# Patient Record
Sex: Male | Born: 1950
Health system: Southern US, Community
[De-identification: ages and names within clinical notes are randomized; demographics above are authoritative.]

## PROBLEM LIST (undated history)

## (undated) DIAGNOSIS — I10 Essential (primary) hypertension: Secondary | ICD-10-CM

## (undated) DIAGNOSIS — C61 Malignant neoplasm of prostate: Secondary | ICD-10-CM

## (undated) DIAGNOSIS — C251 Malignant neoplasm of body of pancreas: Secondary | ICD-10-CM

## (undated) DIAGNOSIS — H269 Unspecified cataract: Secondary | ICD-10-CM

## (undated) DIAGNOSIS — K219 Gastro-esophageal reflux disease without esophagitis: Secondary | ICD-10-CM

## (undated) DIAGNOSIS — K5790 Diverticulosis of intestine, part unspecified, without perforation or abscess without bleeding: Secondary | ICD-10-CM

## (undated) DIAGNOSIS — K4090 Unilateral inguinal hernia, without obstruction or gangrene, not specified as recurrent: Secondary | ICD-10-CM

## (undated) HISTORY — PX: EYE SURGERY: SHX253

## (undated) HISTORY — DX: Essential (primary) hypertension: I10

## (undated) HISTORY — DX: Malignant neoplasm of prostate: C61

## (undated) HISTORY — DX: Unspecified cataract: H26.9

## (undated) HISTORY — DX: Gastro-esophageal reflux disease without esophagitis: K21.9

---

## 1999-01-01 ENCOUNTER — Emergency Department (HOSPITAL_COMMUNITY): Admission: EM | Admit: 1999-01-01 | Discharge: 1999-01-01 | Payer: Self-pay | Admitting: Emergency Medicine

## 1999-01-01 ENCOUNTER — Encounter: Payer: Self-pay | Admitting: Family Medicine

## 1999-08-19 ENCOUNTER — Emergency Department (HOSPITAL_COMMUNITY): Admission: EM | Admit: 1999-08-19 | Discharge: 1999-08-19 | Payer: Self-pay | Admitting: Emergency Medicine

## 1999-08-19 ENCOUNTER — Encounter: Payer: Self-pay | Admitting: Emergency Medicine

## 1999-08-20 ENCOUNTER — Encounter: Admission: RE | Admit: 1999-08-20 | Discharge: 1999-08-20 | Payer: Self-pay | Admitting: Internal Medicine

## 2003-04-19 ENCOUNTER — Emergency Department (HOSPITAL_COMMUNITY): Admission: EM | Admit: 2003-04-19 | Discharge: 2003-04-19 | Payer: Self-pay | Admitting: Emergency Medicine

## 2004-07-11 DIAGNOSIS — C61 Malignant neoplasm of prostate: Secondary | ICD-10-CM

## 2004-07-11 HISTORY — DX: Malignant neoplasm of prostate: C61

## 2004-08-01 HISTORY — PX: PROSTATECTOMY: SHX69

## 2004-08-08 ENCOUNTER — Inpatient Hospital Stay (HOSPITAL_COMMUNITY): Admission: RE | Admit: 2004-08-08 | Discharge: 2004-08-10 | Payer: Self-pay | Admitting: Urology

## 2004-08-08 ENCOUNTER — Encounter (INDEPENDENT_AMBULATORY_CARE_PROVIDER_SITE_OTHER): Payer: Self-pay | Admitting: *Deleted

## 2007-01-19 ENCOUNTER — Ambulatory Visit: Admission: RE | Admit: 2007-01-19 | Discharge: 2007-03-03 | Payer: Self-pay | Admitting: Urology

## 2007-03-04 ENCOUNTER — Ambulatory Visit: Admission: RE | Admit: 2007-03-04 | Discharge: 2007-05-07 | Payer: Self-pay | Admitting: Urology

## 2008-08-09 ENCOUNTER — Emergency Department (HOSPITAL_COMMUNITY): Admission: EM | Admit: 2008-08-09 | Discharge: 2008-08-09 | Payer: Self-pay | Admitting: Emergency Medicine

## 2008-10-01 HISTORY — PX: ROTATOR CUFF REPAIR: SHX139

## 2008-11-07 ENCOUNTER — Ambulatory Visit (HOSPITAL_COMMUNITY): Admission: RE | Admit: 2008-11-07 | Discharge: 2008-11-08 | Payer: Self-pay | Admitting: Orthopedic Surgery

## 2009-03-03 HISTORY — PX: CATARACT EXTRACTION: SUR2

## 2009-03-28 ENCOUNTER — Emergency Department (HOSPITAL_COMMUNITY): Admission: EM | Admit: 2009-03-28 | Discharge: 2009-03-28 | Payer: Self-pay | Admitting: Emergency Medicine

## 2010-05-19 LAB — CBC
MCHC: 35 g/dL (ref 30.0–36.0)
Platelets: 178 10*3/uL (ref 150–400)
RBC: 5.06 MIL/uL (ref 4.22–5.81)
RDW: 13 % (ref 11.5–15.5)
WBC: 4.9 10*3/uL (ref 4.0–10.5)

## 2010-05-19 LAB — BASIC METABOLIC PANEL
BUN: 10 mg/dL (ref 6–23)
Chloride: 95 mEq/L — ABNORMAL LOW (ref 96–112)
GFR calc non Af Amer: >60 mL/min (ref >60)

## 2010-05-19 LAB — POCT CARDIAC MARKERS
CKMB, poc: 3.4 ng/mL (ref 1.0–8.0)
Myoglobin, poc: 99.5 ng/mL (ref 12–200)
Troponin i, poc: <0.05 ng/mL (ref 0.00–0.09)

## 2010-05-19 LAB — DIFFERENTIAL
Basophils Absolute: 0 10*3/uL (ref 0.0–0.1)
Basophils Relative: 1 % (ref 0–1)
Eosinophils Absolute: 0.1 10*3/uL (ref 0.0–0.7)
Eosinophils Relative: 3 % (ref 0–5)
Monocytes Relative: 9 % (ref 3–12)

## 2010-06-07 LAB — COMPREHENSIVE METABOLIC PANEL
ALT: 29 U/L (ref 0–53)
Alkaline Phosphatase: 62 U/L (ref 39–117)
Chloride: 105 mEq/L (ref 96–112)
Creatinine, Ser: 0.75 mg/dL (ref 0.4–1.5)
GFR calc Af Amer: >60 mL/min (ref >60)
Glucose, Bld: 104 mg/dL — ABNORMAL HIGH (ref 70–99)
Sodium: 140 mEq/L (ref 135–145)

## 2010-06-07 LAB — URINALYSIS, ROUTINE W REFLEX MICROSCOPIC
Bilirubin Urine: NEGATIVE
Specific Gravity, Urine: 1.021 (ref 1.005–1.030)

## 2010-06-07 LAB — CBC
HCT: 43.5 % (ref 39.0–52.0)
MCHC: 34.5 g/dL (ref 30.0–36.0)
Platelets: 166 10*3/uL (ref 150–400)
RDW: 13.8 % (ref 11.5–15.5)
WBC: 4.8 10*3/uL (ref 4.0–10.5)

## 2010-06-07 LAB — DIFFERENTIAL
Basophils Absolute: 0 10*3/uL (ref 0.0–0.1)
Basophils Relative: 1 % (ref 0–1)
Eosinophils Absolute: 0.1 10*3/uL (ref 0.0–0.7)
Eosinophils Relative: 2 % (ref 0–5)
Monocytes Absolute: 0.5 10*3/uL (ref 0.1–1.0)
Monocytes Relative: 10 % (ref 3–12)

## 2010-06-07 LAB — PROTIME-INR
INR: 1 (ref 0.00–1.49)
Prothrombin Time: 12.8 seconds (ref 11.6–15.2)

## 2010-06-07 LAB — APTT: aPTT: 25 seconds (ref 24–37)

## 2010-06-10 LAB — URINE CULTURE
Colony Count: NO GROWTH
Culture: NO GROWTH

## 2010-06-10 LAB — URINE MICROSCOPIC-ADD ON

## 2010-06-10 LAB — URINALYSIS, ROUTINE W REFLEX MICROSCOPIC
Ketones, ur: NEGATIVE mg/dL
Leukocytes, UA: NEGATIVE
Protein, ur: >300 mg/dL — AB
Urobilinogen, UA: 1 mg/dL (ref 0.0–1.0)

## 2010-07-19 NOTE — H&P (Signed)
NAMEMarland Kitchen  NAZIAH, WECKERLY NO.:  [id number]   MEDICAL RECORD NO.:  [id number]          PATIENT TYPE:  INP   LOCATION:  0006                         FACILITY:  [facility]   PHYSICIAN:  Excell Seltzer. Annabell Howells, M.D.    DATE OF BIRTH:  04-22-1950   DATE OF ADMISSION:  08/08/2004  DATE OF DISCHARGE:                                HISTORY & PHYSICAL   CHIEF COMPLAINT:  Prostate cancer.   HISTORY OF PRESENT ILLNESS:  Mr. Monds is a 60 year old gentleman who was  sent in consultation by Dr. Valarie Cones for an elevated PSA of 6.8 detected on  routine evaluation. Mr. Hartsell otherwise denied any history of lower  urinary tract symptoms or hematuria. Mr. Reaser did have a maternal  grandfather, however, with a history of prostate cancer. On physical  examination,  the patient's prostate was felt to have significant induration in the left  lobe of the prostate. Subsequent transrectal ultrasound-guided biopsy  demonstrated adenocarcinoma throughout the prostate with the highest  concentration being located within the left lobe and demonstrating a Gleason  score of 3+4=7. A long discussion was subsequently held with Mr. Boughner  concerning the options for therapy including open surgery, radiation  therapy, cryosurgery, to watchful waiting. After discussing these options  with his family and considering the risks and consequences associated with  each modality, he has elected to proceed with open radical retropubic  prostatectomy.   REVIEW OF SYSTEMS:  Negative except as above.   PAST MEDICAL HISTORY:  Significant for gastroesophageal reflux,  hypertension.   PAST SURGICAL HISTORY:  Unremarkable.   ALLERGIES:  No known drug allergies.   SOCIAL HISTORY:  The patient smokes half a pack of cigarettes a day and has  done so for 20 years. He drinks three beers daily.   FAMILY HISTORY:  Prostate cancer, kidney stones.   MEDICATIONS:  1.  Lisinopril/HCTZ 10/12.5 mg daily.  2.  Baby aspirin  81 mg daily.  3.  Prilosec once a day.   PHYSICAL EXAMINATION:  VITAL SIGNS:  The patient is afebrile and  hemodynamically stable.  GENERAL:  Well-developed, well-nourished white male in no acute distress.  HEENT:  Pupils equal, round, and reactive to light. Extraocular movement is  intact.  NECK:  No masses.  HEART:  Regular rate and rhythm.  LUNGS:  Clear to auscultation bilaterally.  ABDOMEN:  Soft, nontender, nondistended. Bowel sounds positive. EXTREMITIES:  No clubbing, cyanosis or edema.  GU:  Normal uncircumcised penis with no plaques or masses. Testicles  descended bilaterally with no masses or tenderness  NEUROLOGIC:  Grossly intact.  SKIN:  No lesions.   ASSESSMENT/PLAN:  A 60 year old white male with adenocarcinoma of the  prostate. We will plan to admit Mr. Susman to [facility] at which time he will undergo radical retropubic prostatectomy with  bilateral pelvic lymph node dissection. Following surgery, he will be  transferred to the Post Anesthesia Care Unit at which time further treatment  decisions will be made based on the patient's condition.       EG/MEDQ  D:  08/08/2004  T:  08/08/2004  Job:  [id number]

## 2010-07-19 NOTE — Op Note (Signed)
NAMEMarland Kitchen  NYAL, SCHACHTER NO.:  [id number]   MEDICAL RECORD NO.:  [id number]          PATIENT TYPE:  INP   LOCATION:  0006                         FACILITY:  [facility]   PHYSICIAN:  Excell Seltzer. Annabell Howells, M.D.    DATE OF BIRTH:  11/18/1950   DATE OF PROCEDURE:  08/08/2004  DATE OF DISCHARGE:                                 OPERATIVE REPORT   PREOPERATIVE DIAGNOSIS:  Adenocarcinoma of the prostate (prostatic specific  antigen equals 6.8, Gleason's 3+4=7, clinical stage T2-C).   POSTOPERATIVE DIAGNOSIS:  Adenocarcinoma of the prostate (prostatic specific  antigen equal 6.8, Gleason's 3+4=7, clinical sage T2-C).   PROCEDURE:  Radical retropubic prostatectomy with bilateral pelvic lymph  node dissection.   ATTENDING SURGEON:  Excell Seltzer. Annabell Howells, M.D.   RESIDENT SURGEON:  Rhae Lerner, M.D.   ANESTHESIA:  General endotracheal anesthesia.   COMPLICATIONS:  None.   ESTIMATED BLOOD LOSS:  500 cc.   INDICATIONS FOR PROCEDURE:  Mr. Detienne is a 60 year old gentleman who was  referred to the [facility] by his primary care physician for evaluation  secondary to a PSA of 6.8 and a family history significant for prostate  cancer. On physical examination, the patient was noted to have an indurated  left lobe. Due to his elevated PSA as well as his abnormal digital rectal  examination, the patient subsequently underwent transrectal ultrasound-  guided biopsy of his prostate. Pathology revealed adenocarcinoma throughout  the entire prostate with the highest concentration in the left lobe with a  Gleason score of 3+4=7. A long discussion was subsequently held with Mr.  Hruska concerning treatment options for prostate cancer including open  surgery, radiation therapy, cryosurgery, and watchful waiting. After  considering these options, Mr. Velis has elected to proceed with open  radical retropubic prostatectomy.   PROCEDURE IN DETAIL:  The patient was brought to the  operating room.  Following induction of general endotracheal anesthesia, he was placed in the  supine position and prepped and draped in the usual sterile fashion. A low  midline incision was subsequently performed and dissection carried down to  the level of the anterior rectus fascia using Bovie cautery. The anterior  rectus fascia was subsequently opened in the midline and the rectus muscles  divided. The space of Retzius was subsequently entered and developed on  either side of the bladder and prostate down to the endopelvic fascia and  the obturator fossa. A Bookwalter retractor was placed at this time and the  retractor blade set for maximal exposure of the right pelvic lymph nodes. A  right pelvic lymph node dissection was subsequently performed using a  combination of sharp dissection with Metzenbaum scissors, blunt dissection  with a Yonker sucker, and careful placement of Weck clips to maintain  hemostasis. Careful attention was paid to the obturator nerve to prevent  injury. Once the dissection was complete, the right pelvic lymph nodes were  delivered from the field and sent for pathologic evaluation. The retractor  blades were subsequently reset for maximal exposure of the left pelvic lymph  nodes, and a left pelvic lymph node dissection was subsequently  performed in  an identical fashion to the right pelvic lymph node dissection. Once the  bilateral pelvic lymph node dissection was complete, the retractor blades  were reset for maximal exposure of the anterior surface of the bladder and  prostate. The endopelvic fascia was opened on either side of the prostate  and  a space developed between the lateral surface of the prostate and the  pelvic sidewall. A 2-0 Vicryl suture was subsequently used to ligate the  superficial vessels lying anterior to the prostate, and these structures  were subsequently divided using Bovie cautery. At this point, a Glatfelter  clamp was passed  between the anterior urethra and the overlying dorsal  venous complex at the prostate apex. This clamp was subsequently used to  pass a #1 Vicryl through the space. The Vicryl was subsequently tied down,  thus ligating the dorsal venous complex. A 2-0 chromic suture was  subsequently used to place a figure-of-eight interrupted suture at the  bladder neck to prevent backbleeding. Bovie cautery was subsequently used to  divide the dorsal venous complex at the prostate apex. Once the dorsal  venous complex had been divided, a small amount of bleeding was noted  posteriorly, and a 2-0 chromic suture was used to obtain hemostasis. Once  this was completed, the neurovascular bundles lying on either side of the  urethra were carefully teased away from the urethra and allowed to fall  posterolaterally. The urethra was then isolated using a right-angle clamp,  and an umbilical tape was passed posterior to the urethra for identification  purposes. The anterior surface of the urethra was then opened using a  scalpel on a long handle and the Foley catheter apprehended using a Kelly  clamp. The catheter was then cut just proximal to the valve stem at the  urethral meatus and the distal Foley catheter brought into the pelvis to aid  in superior retraction. The remainder of the urethra was subsequently  divided using Metzenbaum scissors. The rectourethralis muscle was now  identified and divided using a combination of Bovie cautery and sharp  dissection with Metzenbaum scissors. A plane was now developed between the  posterior surface of the prostate and the anterior surface of the rectum.  The prostatic pedicles were taken down using judicious placement of Weck  clips and dividing the pedicles on the prostate side using Metzenbaum  scissors. Once the prostatic pedicles had been divided, Denonvilliers'  fascia was opened and the seminal vesicles and ampullae of the vas deferens identified. Each vas  deferens was subsequently isolated, clipped, and  divided. The seminal vesicles were then isolated from the surrounding  tissues, clipped, and divided. Attention at this time was turned to the  anterior bladder neck. Dissection was carried down through the anterior  muscle layers until the bladder neck was entered using Bovie cautery. The  bladder was subsequently emptied and the balloon of the proximal Foley  deflated. The proximal catheter was subsequently brought out and clamped  along with the distal tip of the Foley catheter to aid in retraction. The  posterior bladder neck was subsequently taken down, taking special care to  avoid injury to the ureteral orifices. At this point, a few strands of  prostatic pedicle were identified on either side, and these were clipped and  divided. The specimen was subsequently delivered from the field and sent for  pathology. A significant amount of the seminal vesicles appeared to still be  within the pelvis at this time, and thus  the remaining tissue was dissected  away from the surrounding structures, removed, and sent along with the  prostate specimen for pathologic evaluation. Hemostasis was now obtained  using careful application of Bovie cautery. The bladder neck was at this  time everted using multiple interrupted 4-0 chromic sutures. A figure-of-  eight 2-0 chromic suture was used to tailor the bladder neck in a tennis  racket fashion and the urethral stump at 2 o'clock and 5 o'clock. Once the  anastomotic sutures had been placed in the urethral stump, they  were placed  at the bladder neck in the corresponding positions. A 22-French Foley  catheter was subsequently passed into the pelvis and placed within the  bladder and the balloon inflated. All laparotomy sponges and retractor  blades were subsequently removed from the anastomosis. A Jackson-Pratt drain  was subsequently placed through a separate stab incision in the left lower  quadrant  and sutured into position using a  3-0 nylon suture. The rectus fascia was subsequently approximated using a  running #1 PDS and the skin reapproximated using skin staples. The patient  was subsequently allowed to awaken, and the case was ended. The patient  tolerated the procedure well.       JJP/MEDQ  D:  08/08/2004  T:  08/08/2004  Job:  [id number]

## 2010-12-27 ENCOUNTER — Other Ambulatory Visit: Payer: Self-pay | Admitting: Internal Medicine

## 2010-12-27 DIAGNOSIS — M545 Low back pain, unspecified: Secondary | ICD-10-CM

## 2011-02-11 ENCOUNTER — Other Ambulatory Visit: Payer: Self-pay | Admitting: Family Medicine

## 2011-02-11 ENCOUNTER — Ambulatory Visit (INDEPENDENT_AMBULATORY_CARE_PROVIDER_SITE_OTHER): Payer: BC Managed Care – PPO

## 2011-02-11 DIAGNOSIS — M545 Low back pain, unspecified: Secondary | ICD-10-CM

## 2011-02-11 DIAGNOSIS — R209 Unspecified disturbances of skin sensation: Secondary | ICD-10-CM

## 2011-02-14 ENCOUNTER — Ambulatory Visit
Admission: RE | Admit: 2011-02-14 | Discharge: 2011-02-14 | Disposition: A | Discharge status: Home / Self Care | Payer: Self-pay | Source: Ambulatory Visit | Attending: Family Medicine | Admitting: Family Medicine

## 2011-02-14 DIAGNOSIS — M545 Low back pain, unspecified: Secondary | ICD-10-CM

## 2011-02-15 ENCOUNTER — Ambulatory Visit
Admission: RE | Admit: 2011-02-15 | Discharge: 2011-02-15 | Disposition: A | Discharge status: Home / Self Care | Payer: BC Managed Care – PPO | Source: Ambulatory Visit | Attending: Family Medicine | Admitting: Family Medicine

## 2011-02-15 MED ORDER — GADOBENATE DIMEGLUMINE 529 MG/ML IV SOLN
15.0000 mL | Freq: Once | INTRAVENOUS | Status: AC | PRN
  Administered 2011-02-15: 15 mL via INTRAVENOUS

## 2011-03-07 ENCOUNTER — Encounter: Payer: Self-pay | Admitting: Physician Assistant

## 2011-03-07 DIAGNOSIS — C61 Malignant neoplasm of prostate: Secondary | ICD-10-CM | POA: Insufficient documentation

## 2011-03-07 DIAGNOSIS — I10 Essential (primary) hypertension: Secondary | ICD-10-CM | POA: Insufficient documentation

## 2011-04-18 ENCOUNTER — Telehealth: Payer: Self-pay

## 2011-04-18 NOTE — Telephone Encounter (Signed)
[facility]  'YOUR PATIENT OF SIX YEARS WOULD LIKE TO TALK WITH YOU. CALL [phone]

## 2011-04-19 ENCOUNTER — Other Ambulatory Visit: Payer: Self-pay | Admitting: Physician Assistant

## 2011-04-19 MED ORDER — MELOXICAM 15 MG PO TABS: 15.0000 mg | ORAL_TABLET | Freq: Every day | ORAL | Status: DC

## 2011-04-21 NOTE — Telephone Encounter (Signed)
What is this about?

## 2011-04-21 NOTE — Telephone Encounter (Signed)
LMOM to CB, on Home #. Number pt left [phone] said "[employer] cust not accepting calls at this time, try again later"

## 2011-04-22 NOTE — Telephone Encounter (Signed)
LMOM to CB at [facility] #. # pt left states pt not available, please try back later.

## 2011-04-23 NOTE — Telephone Encounter (Signed)
LMOM at home for patient to call, since we are unable to get through on cell.

## 2011-04-25 NOTE — Telephone Encounter (Signed)
Unable to reach letter sent to pt. °

## 2011-07-19 ENCOUNTER — Other Ambulatory Visit: Payer: Self-pay | Admitting: Physician Assistant

## 2011-07-20 NOTE — Telephone Encounter (Signed)
Yes

## 2011-07-20 NOTE — Telephone Encounter (Signed)
Ok x 1, due for recheck/labs for more?

## 2011-07-21 ENCOUNTER — Ambulatory Visit (INDEPENDENT_AMBULATORY_CARE_PROVIDER_SITE_OTHER): Payer: BC Managed Care – PPO | Admitting: Family Medicine

## 2011-07-21 VITALS — BP 120/61 | HR 88 | Temp 98.2°F | Resp 16 | Ht 69.75 in | Wt 173.6 lb

## 2011-07-21 DIAGNOSIS — I10 Essential (primary) hypertension: Secondary | ICD-10-CM

## 2011-07-21 DIAGNOSIS — M129 Arthropathy, unspecified: Secondary | ICD-10-CM

## 2011-07-21 DIAGNOSIS — M1990 Unspecified osteoarthritis, unspecified site: Secondary | ICD-10-CM

## 2011-07-21 DIAGNOSIS — M25519 Pain in unspecified shoulder: Secondary | ICD-10-CM

## 2011-07-21 LAB — BASIC METABOLIC PANEL WITH GFR
BUN: 14 mg/dL (ref 6–23)
Chloride: 102 meq/L (ref 96–112)
Creat: 0.71 mg/dL (ref 0.50–1.35)
Potassium: 3.8 meq/L (ref 3.5–5.3)
Sodium: 139 meq/L (ref 135–145)

## 2011-07-21 LAB — BASIC METABOLIC PANEL
CO2: 24 mEq/L (ref 19–32)
Calcium: 9.5 mg/dL (ref 8.4–10.5)
Glucose, Bld: 129 mg/dL — ABNORMAL HIGH (ref 70–99)

## 2011-07-21 MED ORDER — MELOXICAM 15 MG PO TABS: 15.0000 mg | ORAL_TABLET | Freq: Every day | ORAL | Status: DC

## 2011-07-21 MED ORDER — LISINOPRIL-HYDROCHLOROTHIAZIDE 20-12.5 MG PO TABS: 1.0000 | ORAL_TABLET | Freq: Every day | ORAL | Status: DC

## 2011-07-21 NOTE — Progress Notes (Signed)
[facility]:  Office Visit  Chief Complaint:  Chief Complaint  Patient presents with  . Arm Pain    right shoulder is still giving him  problems with pulled muscle  . Foot Pain    needs a refill on meloxicam    HPI: Perry Robinson is a 61 y.o. male who complains of:  1. Right shoulder pain, questionable right biceps tendon occurred at work on Apr 11, 2011, lifts a lot at work, Systems analyst. Has seen Dr. Sedonia Small for this and currently doing conservative management  2. Arthritis-here for meloxicam refill  3. HTN-compliant with meds, does not take BP at home. Denies SEs  Past Medical History  Diagnosis Date  . Hypertension   . GERD (gastroesophageal reflux disease)   . Prostate cancer 07/11/2004   Past Surgical History  Procedure Date  . Prostatectomy 08/2004    2nd to CA  . Cataract extraction 2011   History   Social History  . Marital Status: Married    Spouse Name: N/A    Number of Children: N/A  . Years of Education: N/A   Social History Main Topics  . Smoking status: Former Smoker -- 0.3 packs/day    Types: Cigarettes    Quit date: 03/03/2010  . Smokeless tobacco: None  . Alcohol Use: Yes  . Drug Use: No  . Sexually Active:    Other Topics Concern  . None   Social History Narrative  . None   No family history on file. No Known Allergies Prior to Admission medications   Medication Sig Start Date End Date Taking? Authorizing Provider  lisinopril-hydrochlorothiazide (PRINZIDE,ZESTORETIC) 20-12.5 MG per tablet Take 1 tablet by mouth daily.     Yes Historical Provider, MD  meloxicam (MOBIC) 15 MG tablet TAKE ONE TABLET BY MOUTH DAILY 07/19/11  Yes Chelle S Jeffery, PA-C  aspirin 81 MG tablet Take 81 mg by mouth daily.      Historical Provider, MD     ROS: The patient denies fevers, chills, night sweats, unintentional weight loss, chest pain, palpitations, wheezing, dyspnea on exertion, nausea, vomiting, abdominal pain,  dysuria, hematuria, melena, numbness, weakness, or tingling. + msk pain in hands/shoulder  All other systems have been reviewed and were otherwise negative with the exception of those mentioned in the HPI and as above.    PHYSICAL EXAM: Filed Vitals:   07/21/11 0759  BP: 120/61  Pulse: 88  Temp: 98.2 F (36.8 C)  Resp: 16   Filed Vitals:   07/21/11 0759  Height: 5' 9.75" (1.772 m)  Weight: 173 lb 9.6 oz (78.744 kg)   Body mass index is 25.09 kg/(m^2).  General: Alert, no acute distress HEENT:  Normocephalic, atraumatic, oropharynx patent.  Cardiovascular:  Regular rate and rhythm, no rubs murmurs or gallops.  No Carotid bruits, radial pulse intact. No pedal edema.  Respiratory: Clear to auscultation bilaterally.  No wheezes, rales, or rhonchi.  No cyanosis, no use of accessory musculature GI: No organomegaly, abdomen is soft and non-tender, positive bowel sounds.  No masses. Skin: No rashes. Neurologic: Facial musculature symmetric. Psychiatric: Patient is appropriate throughout our interaction. Lymphatic: No cervical lymphadenopathy Musculoskeletal: Gait intact. Right shoulder: biceps tendon slightly displaced down midshaft of humerus with minimal atrophy, + weakness 4/5, +crepitus, Decreased internal, external rotation, sensation intact, + Neers.    LABS: Results for orders placed in visit on 07/21/11  BASIC METABOLIC PANEL      Component Value Range   Sodium 139  135 - 145 (mEq/L)   Potassium 3.8  3.5 - 5.3 (mEq/L)   Chloride 102  96 - 112 (mEq/L)   CO2 24  19 - 32 (mEq/L)   Glucose, Bld 129 (*) 70 - 99 (mg/dL)   BUN 14  6 - 23 (mg/dL)   Creat 4.78  2.95 - 6.21 (mg/dL)   Calcium 9.5  8.4 - 30.8 (mg/dL)     EKG/XRAY:   Primary read interpreted by Dr. Conley Rolls at [facility].   ASSESSMENT/PLAN: Encounter Diagnoses  Name Primary?  . HTN (hypertension) Yes  . Arthritis   . Shoulder pain     1. Refill Mobic for arthritic and shoulder pain. F/u with [facility] ortho prn  for shoulder once he has enough time to take off work/if it bothers his daily function to the point he desires surgery 2. HTN-refill meds, labs are nl, will f/u in 6 months   Noa Constante PHUONG, DO 07/23/2011 10:33 AM

## 2011-09-22 ENCOUNTER — Encounter: Payer: Self-pay | Admitting: Family Medicine

## 2011-12-22 ENCOUNTER — Ambulatory Visit (INDEPENDENT_AMBULATORY_CARE_PROVIDER_SITE_OTHER): Payer: BC Managed Care – PPO | Admitting: Family Medicine

## 2011-12-22 ENCOUNTER — Encounter: Payer: Self-pay | Admitting: Family Medicine

## 2011-12-22 VITALS — BP 145/58 | HR 87 | Temp 98.7°F | Resp 16 | Ht 69.5 in | Wt 171.0 lb

## 2011-12-22 DIAGNOSIS — M1990 Unspecified osteoarthritis, unspecified site: Secondary | ICD-10-CM

## 2011-12-22 DIAGNOSIS — M129 Arthropathy, unspecified: Secondary | ICD-10-CM

## 2011-12-22 DIAGNOSIS — Z23 Encounter for immunization: Secondary | ICD-10-CM

## 2011-12-22 DIAGNOSIS — M25519 Pain in unspecified shoulder: Secondary | ICD-10-CM

## 2011-12-22 DIAGNOSIS — R197 Diarrhea, unspecified: Secondary | ICD-10-CM

## 2011-12-22 LAB — POCT CBC
HCT, POC: 47.9 % (ref 43.5–53.7)
MID (cbc): 0.4 (ref 0–0.9)
MPV: 7.9 fL (ref 0–99.8)
POC Granulocyte: 2.2 (ref 2–6.9)
POC LYMPH PERCENT: 33.4 %L (ref 10–50)
POC MID %: 9.7 %M (ref 0–12)
Platelet Count, POC: 214 10*3/uL (ref 142–424)
RBC: 4.82 M/uL (ref 4.69–6.13)
RDW, POC: 13.9 %
WBC: 3.8 10*3/uL — AB (ref 4.6–10.2)

## 2011-12-22 LAB — COMPREHENSIVE METABOLIC PANEL
ALT: 19 U/L (ref 0–53)
AST: 23 U/L (ref 0–37)
Albumin: 4.5 g/dL (ref 3.5–5.2)
BUN: 13 mg/dL (ref 6–23)
CO2: 26 mEq/L (ref 19–32)
Sodium: 138 mEq/L (ref 135–145)

## 2011-12-22 MED ORDER — MELOXICAM 15 MG PO TABS: 15.0000 mg | ORAL_TABLET | Freq: Every day | ORAL | Status: DC

## 2011-12-22 NOTE — Patient Instructions (Addendum)
Try stopping your meloxicam for a week of two to see if it helps with your diarrhea.  If it does NOT please give me a call.  Let's try and have you use tylenol on some days, as excessive use of meloxicam could cause an ulcer.   We need to have you schedule a colonoscopy.  Please try contacting [facility] or [facility], or [facility] for a consultation for a "screening colonoscopy"

## 2011-12-22 NOTE — Progress Notes (Signed)
[facility] [address] [place] [address] [phone]  Date:  12/22/2011   Name:  Perry Robinson   DOB:  [birth year]   MRN:  [id number]  PCP:  Virgilio Belling    Chief Complaint: Medication Refill and Diarrhea   History of Present Illness:  Perry Robinson is a 61 y.o. very pleasant male patient who presents with the following:  He is here today for a couple of issues.  He has been using meloxicam off an on for shoulder pain and various body pains.  This is helpful and he would like some more.   He has noted diarrhea that seems to occur after he eats lunch. This does not occur every day, but happens about 3 times a week.  He has tried to determine if it is related to any particular foods but he has not found any connection.  He has had this problem for the last 3 or 4 months.  No blood in his stool, no weight loss, no nausea/ vomiting or pain.    He uses meloxican every day- he finds this works well for his pains.    He has never had a colonoscopy.   Patient Active Problem List  Diagnosis  . Hypertension  . Prostate cancer    Past Medical History  Diagnosis Date  . Hypertension   . GERD (gastroesophageal reflux disease)   . Prostate cancer 07/11/2004    Past Surgical History  Procedure Date  . Prostatectomy 08/2004    2nd to CA  . Cataract extraction 2011    History  Substance Use Topics  . Smoking status: Current Every Day Smoker -- 0.3 packs/day    Types: Cigarettes    Last Attempt to Quit: 03/03/2010  . Smokeless tobacco: Not on file  . Alcohol Use: Yes    Family History  Problem Relation Age of Onset  . Thyroid disease Mother     No Known Allergies  Medication list has been reviewed and updated.  Current Outpatient Prescriptions on File Prior to Visit  Medication Sig Dispense Refill  . lisinopril-hydrochlorothiazide (PRINZIDE,ZESTORETIC) 20-12.5 MG per tablet Take 1 tablet by mouth daily.  1 tablet  5  .  meloxicam (MOBIC) 15 MG tablet Take 1 tablet (15 mg total) by mouth daily.  30 tablet  3  . aspirin 81 MG tablet Take 81 mg by mouth daily.          Review of Systems:  As per HPI- otherwise negative.   Physical Examination: Filed Vitals:   12/22/11 0843  BP: 145/58  Pulse: 87  Temp: 98.7 F (37.1 C)  Resp: 16   Filed Vitals:   12/22/11 0843  Height: 5' 9.5" (1.765 m)  Weight: 171 lb (77.565 kg)   Body mass index is 24.89 kg/(m^2). Ideal Body Weight: Weight in (lb) to have BMI = 25: 171.4   GEN: WDWN, NAD, Non-toxic, A & O x 3, looks well HEENT: Atraumatic, Normocephalic. Neck supple. No masses, No LAD. Ears and Nose: No external deformity. CV: RRR, No M/G/R. No JVD. No thrill. No extra heart sounds. PULM: CTA B, no wheezes, crackles, rhonchi. No retractions. No resp. distress. No accessory muscle use. ABD: S, NT, ND, +BS. No rebound. No HSM. EXTR: No c/c/e NEURO Normal gait.  PSYCH: Normally interactive. Conversant. Not depressed or anxious appearing.  Calm demeanor.    Assessment and Plan: 1. Diarrhea  POCT CBC, Comprehensive metabolic panel  2. Arthritis  meloxicam (  MOBIC) 15 MG tablet  3. Shoulder pain  meloxicam (MOBIC) 15 MG tablet   Davian's diarrhea may be due to mobic use.  He will try holding it for a week or two and see if it helps- see pt instructions for more details.    Abbe Amsterdam, MD

## 2011-12-23 ENCOUNTER — Encounter: Payer: Self-pay | Admitting: Family Medicine

## 2012-03-08 ENCOUNTER — Telehealth: Payer: Self-pay

## 2012-03-08 NOTE — Telephone Encounter (Signed)
Patient was her for this in October and it was believed to be from the Meloxicam. I have advised him to d/c the Meloxicam and to take Immodium, have advised him

## 2012-03-08 NOTE — Telephone Encounter (Signed)
Agree with discontinuing Mobic and OTC Immodium.  If diarrhea persists or if he develops fever, chills, blood or mucus in the stool he will need to be evaluated

## 2012-03-08 NOTE — Telephone Encounter (Signed)
Pt states that today he has had a sudden onset of diarrhea, pt states that he is unable to leave his house because he is afraid of defecating on himself. Pt would like to know if someone could call him in something for his diarrhea. Best#  [phone] Pharmacy: [facility]

## 2012-03-09 NOTE — Telephone Encounter (Signed)
Thanks, patient advised he will return to clinic today if not resolved.

## 2012-03-18 ENCOUNTER — Ambulatory Visit (INDEPENDENT_AMBULATORY_CARE_PROVIDER_SITE_OTHER): Payer: BC Managed Care – PPO | Admitting: Emergency Medicine

## 2012-03-18 VITALS — BP 130/70 | HR 81 | Temp 98.2°F | Resp 18 | Wt 175.0 lb

## 2012-03-18 DIAGNOSIS — M5430 Sciatica, unspecified side: Secondary | ICD-10-CM

## 2012-03-18 DIAGNOSIS — I10 Essential (primary) hypertension: Secondary | ICD-10-CM

## 2012-03-18 DIAGNOSIS — M1990 Unspecified osteoarthritis, unspecified site: Secondary | ICD-10-CM

## 2012-03-18 DIAGNOSIS — M25519 Pain in unspecified shoulder: Secondary | ICD-10-CM

## 2012-03-18 MED ORDER — CYCLOBENZAPRINE HCL 10 MG PO TABS: 10.0000 mg | ORAL_TABLET | Freq: Three times a day (TID) | ORAL | Status: DC | PRN

## 2012-03-18 MED ORDER — NAPROXEN SODIUM 550 MG PO TABS: 550.0000 mg | ORAL_TABLET | Freq: Two times a day (BID) | ORAL | Status: DC

## 2012-03-18 MED ORDER — LISINOPRIL-HYDROCHLOROTHIAZIDE 20-12.5 MG PO TABS: 1.0000 | ORAL_TABLET | Freq: Every day | ORAL | Status: DC

## 2012-03-18 MED ORDER — HYDROCODONE-ACETAMINOPHEN 5-325 MG PO TABS: 1.0000 | ORAL_TABLET | ORAL | Status: DC | PRN

## 2012-03-18 NOTE — Patient Instructions (Addendum)
Sciatica Sciatica is pain, weakness, numbness, or tingling along the path of the sciatic nerve. The nerve starts in the lower back and runs down the back of each leg. The nerve controls the muscles in the lower leg and in the back of the knee, while also providing sensation to the back of the thigh, lower leg, and the sole of your foot. Sciatica is a symptom of another medical condition. For instance, nerve damage or certain conditions, such as a herniated disk or bone spur on the spine, pinch or put pressure on the sciatic nerve. This causes the pain, weakness, or other sensations normally associated with sciatica. Generally, sciatica only affects one side of the body. CAUSES   Herniated or slipped disc.  Degenerative disk disease.  A pain disorder involving the narrow muscle in the buttocks (piriformis syndrome).  Pelvic injury or fracture.  Pregnancy.  Tumor (rare). SYMPTOMS  Symptoms can vary from mild to very severe. The symptoms usually travel from the low back to the buttocks and down the back of the leg. Symptoms can include:  Mild tingling or dull aches in the lower back, leg, or hip.  Numbness in the back of the calf or sole of the foot.  Burning sensations in the lower back, leg, or hip.  Sharp pains in the lower back, leg, or hip.  Leg weakness.  Severe back pain inhibiting movement. These symptoms may get worse with coughing, sneezing, laughing, or prolonged sitting or standing. Also, being overweight may worsen symptoms. DIAGNOSIS  Your caregiver will perform a physical exam to look for common symptoms of sciatica. He or she may ask you to do certain movements or activities that would trigger sciatic nerve pain. Other tests may be performed to find the cause of the sciatica. These may include:  Blood tests.  X-rays.  Imaging tests, such as an MRI or CT scan. TREATMENT  Treatment is directed at the cause of the sciatic pain. Sometimes, treatment is not necessary  and the pain and discomfort goes away on its own. If treatment is needed, your caregiver may suggest:  Over-the-counter medicines to relieve pain.  Prescription medicines, such as anti-inflammatory medicine, muscle relaxants, or narcotics.  Applying heat or ice to the painful area.  Steroid injections to lessen pain, irritation, and inflammation around the nerve.  Reducing activity during periods of pain.  Exercising and stretching to strengthen your abdomen and improve flexibility of your spine. Your caregiver may suggest losing weight if the extra weight makes the back pain worse.  Physical therapy.  Surgery to eliminate what is pressing or pinching the nerve, such as a bone spur or part of a herniated disk. HOME CARE INSTRUCTIONS   Only take over-the-counter or prescription medicines for pain or discomfort as directed by your caregiver.  Apply ice to the affected area for 20 minutes, 3 4 times a day for the first 48 72 hours. Then try heat in the same way.  Exercise, stretch, or perform your usual activities if these do not aggravate your pain.  Attend physical therapy sessions as directed by your caregiver.  Keep all follow-up appointments as directed by your caregiver.  Do not wear high heels or shoes that do not provide proper support.  Check your mattress to see if it is too soft. A firm mattress may lessen your pain and discomfort. SEEK IMMEDIATE MEDICAL CARE IF:   You lose control of your bowel or bladder (incontinence).  You have increasing weakness in the lower back,   pelvis, buttocks, or legs.  You have redness or swelling of your back.  You have a burning sensation when you urinate.  You have pain that gets worse when you lie down or awakens you at night.  Your pain is worse than you have experienced in the past.  Your pain is lasting longer than 4 weeks.  You are suddenly losing weight without reason. MAKE SURE YOU:  Understand these  instructions.  Will watch your condition.  Will get help right away if you are not doing well or get worse. Document Released: 02/11/2001 Document Revised: 08/19/2011 Document Reviewed: 06/29/2011 [employer] Patient Information 2013 [employer].  

## 2012-03-18 NOTE — Progress Notes (Signed)
[facility] [address], [place] [address] [phone]  Date:  03/18/2012   Name:  Perry Robinson   DOB:  30-Jul-1950   MRN:  [id number]  PCP:  Virgilio Belling    Chief Complaint: Hypertension, Abdominal Pain and Back Pain   History of Present Illness:  Perry Robinson is a 62 y.o. very pleasant male patient who presents with the following:  Took a step up yesterday and developed a sharp pain in his low back that was lancinating on the left side associated with pain into his left hip and paresthesias in left leg to below knee.  No history of injury or overuse.  Continues to have paresthesias in leg today not associated with numbness or weakness.   Has a history of prior MRI for low back and radicular pain that was significant for diffuse disease.  Patient Active Problem List  Diagnosis  . Hypertension  . Prostate cancer    Past Medical History  Diagnosis Date  . Hypertension   . GERD (gastroesophageal reflux disease)   . Prostate cancer 07/11/2004  . Cataract     Past Surgical History  Procedure Date  . Prostatectomy 08/2004    2nd to CA  . Cataract extraction 2011  . Eye surgery     History  Substance Use Topics  . Smoking status: Current Some Day Smoker -- 0.3 packs/day    Types: Cigarettes    Last Attempt to Quit: 03/03/2010  . Smokeless tobacco: Not on file  . Alcohol Use: Yes    Family History  Problem Relation Age of Onset  . Thyroid disease Mother     No Known Allergies  Medication list has been reviewed and updated.  Current Outpatient Prescriptions on File Prior to Visit  Medication Sig Dispense Refill  . aspirin 81 MG tablet Take 81 mg by mouth daily.        Marland Kitchen lisinopril-hydrochlorothiazide (PRINZIDE,ZESTORETIC) 20-12.5 MG per tablet Take 1 tablet by mouth daily.  1 tablet  5    Review of Systems:  As per HPI, otherwise negative.    Physical Examination: Filed Vitals:   03/18/12 0814  BP: 130/70    Pulse: 81  Temp: 98.2 F (36.8 C)  Resp: 18   Filed Vitals:   03/18/12 0814  Weight: 175 lb (79.379 kg)   There is no height on file to calculate BMI. Ideal Body Weight:    GEN: WDWN, NAD, Non-toxic, A & O x 3 HEENT: Atraumatic, Normocephalic. Neck supple. No masses, No LAD. Ears and Nose: No external deformity. CV: RRR, No M/G/R. No JVD. No thrill. No extra heart sounds. PULM: CTA B, no wheezes, crackles, rhonchi. No retractions. No resp. distress. No accessory muscle use. ABD: S, NT, ND, +BS. No rebound. No HSM. EXTR: No c/c/e NEURO Normal gait.  PSYCH: Normally interactive. Conversant. Not depressed or anxious appearing.  Calm demeanor.  BACK:  Tender with spasm bilateral lumbar paraspinous muscles. No tenderness over spine  Assessment and Plan: Sciatic neuritis Hypertension under control Will try and decrease muscle spasm/strain and if does not relieve will need an MRI. Anaprox Flexeril vicodin Follow up in one week  Carmelina Dane, MD

## 2012-03-18 NOTE — Addendum Note (Signed)
Addended by: Carmelina Dane on: 03/18/2012 09:18 AM   Modules accepted: Orders

## 2012-05-17 ENCOUNTER — Ambulatory Visit (INDEPENDENT_AMBULATORY_CARE_PROVIDER_SITE_OTHER): Payer: BC Managed Care – PPO | Admitting: Emergency Medicine

## 2012-05-17 VITALS — BP 148/70 | HR 86 | Temp 98.5°F | Resp 16 | Ht 69.0 in | Wt 175.0 lb

## 2012-05-17 DIAGNOSIS — M5430 Sciatica, unspecified side: Secondary | ICD-10-CM

## 2012-05-17 DIAGNOSIS — M5432 Sciatica, left side: Secondary | ICD-10-CM

## 2012-05-17 MED ORDER — HYDROCODONE-ACETAMINOPHEN 5-325 MG PO TABS: 1.0000 | ORAL_TABLET | ORAL | Status: DC | PRN

## 2012-05-17 MED ORDER — PREDNISONE 10 MG PO KIT: PACK | ORAL | Status: DC

## 2012-05-17 NOTE — Progress Notes (Signed)
[facility] [address], [place] [address] [phone]  Date:  05/17/2012   Name:  BREYER TEJERA   DOB:  [birth year]   MRN:  [id number]  PCP:  Virgilio Belling    Chief Complaint: Back Pain   History of Present Illness:  ADONAI SELSOR is a 62 y.o. very pleasant male patient who presents with the following:  Seen 03/18/12 for sciatic neuritis.  Now having marked midline lower back pain with tingling into left posterior thigh to knee.  No pain or weakness in the leg.  Denies improvement with medications.  History of prostate CA with no metastasis.  Denies injury or overuse.  Patient Active Problem List  Diagnosis  . Hypertension  . Prostate cancer    Past Medical History  Diagnosis Date  . Hypertension   . GERD (gastroesophageal reflux disease)   . Prostate cancer 07/11/2004  . Cataract     Past Surgical History  Procedure Laterality Date  . Prostatectomy  08/2004    2nd to CA  . Cataract extraction  2011  . Eye surgery      History  Substance Use Topics  . Smoking status: Current Some Day Smoker -- 0.30 packs/day    Types: Cigarettes    Last Attempt to Quit: 03/03/2010  . Smokeless tobacco: Not on file  . Alcohol Use: Yes    Family History  Problem Relation Age of Onset  . Thyroid disease Mother     No Known Allergies  Medication list has been reviewed and updated.  Current Outpatient Prescriptions on File Prior to Visit  Medication Sig Dispense Refill  . lisinopril-hydrochlorothiazide (PRINZIDE,ZESTORETIC) 20-12.5 MG per tablet Take 1 tablet by mouth daily.  1 tablet  5  . aspirin 81 MG tablet Take 81 mg by mouth daily.        . cyclobenzaprine (FLEXERIL) 10 MG tablet Take 1 tablet (10 mg total) by mouth 3 (three) times daily as needed for muscle spasms.  30 tablet  0  . HYDROcodone-acetaminophen (NORCO) 5-325 MG per tablet Take 1 tablet by mouth every 4 (four) hours as needed for pain.  30 tablet  0  . naproxen  sodium (ANAPROX DS) 550 MG tablet Take 1 tablet (550 mg total) by mouth 2 (two) times daily with a meal.  40 tablet  0   No current facility-administered medications on file prior to visit.    Review of Systems:  As per HPI, otherwise negative.    Physical Examination: Filed Vitals:   05/17/12 0807  BP: 148/70  Pulse: 86  Temp: 98.5 F (36.9 C)  Resp: 16   Filed Vitals:   05/17/12 0807  Height: 5\' 9"  (1.753 m)  Weight: 175 lb (79.379 kg)   Body mass index is 25.83 kg/(m^2). Ideal Body Weight: Weight in (lb) to have BMI = 25: 168.9   GEN: WDWN, NAD, Non-toxic, Alert & Oriented x 3 HEENT: Atraumatic, Normocephalic.  Ears and Nose: No external deformity. EXTR: No clubbing/cyanosis/edema NEURO: Normal gait.  PSYCH: Normally interactive. Conversant. Not depressed or anxious appearing.  Calm demeanor.  BACK:  Tender.  Neuro exam intact.    Assessment and Plan: Sciatic neuritis MRI Prednisone dose pak Refill vicodin Follow up after MRI  Carmelina Dane, MD

## 2012-05-17 NOTE — Patient Instructions (Addendum)
Sciatica Sciatica is pain, weakness, numbness, or tingling along the path of the sciatic nerve. The nerve starts in the lower back and runs down the back of each leg. The nerve controls the muscles in the lower leg and in the back of the knee, while also providing sensation to the back of the thigh, lower leg, and the sole of your foot. Sciatica is a symptom of another medical condition. For instance, nerve damage or certain conditions, such as a herniated disk or bone spur on the spine, pinch or put pressure on the sciatic nerve. This causes the pain, weakness, or other sensations normally associated with sciatica. Generally, sciatica only affects one side of the body. CAUSES   Herniated or slipped disc.  Degenerative disk disease.  A pain disorder involving the narrow muscle in the buttocks (piriformis syndrome).  Pelvic injury or fracture.  Pregnancy.  Tumor (rare). SYMPTOMS  Symptoms can vary from mild to very severe. The symptoms usually travel from the low back to the buttocks and down the back of the leg. Symptoms can include:  Mild tingling or dull aches in the lower back, leg, or hip.  Numbness in the back of the calf or sole of the foot.  Burning sensations in the lower back, leg, or hip.  Sharp pains in the lower back, leg, or hip.  Leg weakness.  Severe back pain inhibiting movement. These symptoms may get worse with coughing, sneezing, laughing, or prolonged sitting or standing. Also, being overweight may worsen symptoms. DIAGNOSIS  Your caregiver will perform a physical exam to look for common symptoms of sciatica. He or she may ask you to do certain movements or activities that would trigger sciatic nerve pain. Other tests may be performed to find the cause of the sciatica. These may include:  Blood tests.  X-rays.  Imaging tests, such as an MRI or CT scan. TREATMENT  Treatment is directed at the cause of the sciatic pain. Sometimes, treatment is not necessary  and the pain and discomfort goes away on its own. If treatment is needed, your caregiver may suggest:  Over-the-counter medicines to relieve pain.  Prescription medicines, such as anti-inflammatory medicine, muscle relaxants, or narcotics.  Applying heat or ice to the painful area.  Steroid injections to lessen pain, irritation, and inflammation around the nerve.  Reducing activity during periods of pain.  Exercising and stretching to strengthen your abdomen and improve flexibility of your spine. Your caregiver may suggest losing weight if the extra weight makes the back pain worse.  Physical therapy.  Surgery to eliminate what is pressing or pinching the nerve, such as a bone spur or part of a herniated disk. HOME CARE INSTRUCTIONS   Only take over-the-counter or prescription medicines for pain or discomfort as directed by your caregiver.  Apply ice to the affected area for 20 minutes, 3 4 times a day for the first 48 72 hours. Then try heat in the same way.  Exercise, stretch, or perform your usual activities if these do not aggravate your pain.  Attend physical therapy sessions as directed by your caregiver.  Keep all follow-up appointments as directed by your caregiver.  Do not wear high heels or shoes that do not provide proper support.  Check your mattress to see if it is too soft. A firm mattress may lessen your pain and discomfort. SEEK IMMEDIATE MEDICAL CARE IF:   You lose control of your bowel or bladder (incontinence).  You have increasing weakness in the lower back,   pelvis, buttocks, or legs.  You have redness or swelling of your back.  You have a burning sensation when you urinate.  You have pain that gets worse when you lie down or awakens you at night.  Your pain is worse than you have experienced in the past.  Your pain is lasting longer than 4 weeks.  You are suddenly losing weight without reason. MAKE SURE YOU:  Understand these  instructions.  Will watch your condition.  Will get help right away if you are not doing well or get worse. Document Released: 02/11/2001 Document Revised: 08/19/2011 Document Reviewed: 06/29/2011 [employer] Patient Information 2013 [employer].  

## 2012-05-19 ENCOUNTER — Telehealth: Payer: Self-pay | Admitting: Radiology

## 2012-05-19 NOTE — Telephone Encounter (Signed)
Mri lumbar approved by [employer], I called to give additional clinical information [phone] this is good for 30 days

## 2012-06-07 ENCOUNTER — Encounter: Payer: Self-pay | Admitting: Family Medicine

## 2012-06-07 ENCOUNTER — Ambulatory Visit (INDEPENDENT_AMBULATORY_CARE_PROVIDER_SITE_OTHER): Payer: BC Managed Care – PPO | Admitting: Family Medicine

## 2012-06-07 VITALS — BP 126/70 | HR 94 | Temp 98.3°F | Resp 16 | Ht 70.5 in | Wt 177.8 lb

## 2012-06-07 DIAGNOSIS — L723 Sebaceous cyst: Secondary | ICD-10-CM

## 2012-06-07 NOTE — Patient Instructions (Addendum)
Let me know if the area on the back of your neck continues to bother you- if it is still painful or gets big again let me know.  Apply hot compresses and gently squeeze the area a couple of times a day for the next couple of days- more material may accumulate

## 2012-06-07 NOTE — Progress Notes (Signed)
[facility] [address], [place] [address] [phone]  Date:  06/07/2012   Name:  Perry Robinson   DOB:  [birth year]   MRN:  [id number]  PCP:  Virgilio Belling    Chief Complaint: knot on neck   History of Present Illness:  Perry Robinson is a 62 y.o. very pleasant male patient who presents with the following:  He is here today with a HA for the last 2 days. He was taking a shower on Saturday am and noted a slightly tender knot on his posterior right neck.  When he presses on this area it is tender and sends a pain through his head- this is the HA he is describing.    No other knots on his body, no other symptoms. Otherwise he feels well   He has been working a lot lately- he is very busy "my sinuses have been bothering me for 20 years."  He notes nasal drainage. However, this is not a concern for him today because these symptoms are baseline.      Patient Active Problem List  Diagnosis  . Hypertension  . Prostate cancer    Past Medical History  Diagnosis Date  . Hypertension   . GERD (gastroesophageal reflux disease)   . Prostate cancer 07/11/2004  . Cataract     Past Surgical History  Procedure Laterality Date  . Prostatectomy  08/2004    2nd to CA  . Cataract extraction  2011  . Eye surgery      History  Substance Use Topics  . Smoking status: Current Some Day Smoker -- 0.30 packs/day    Types: Cigarettes    Last Attempt to Quit: 03/03/2010  . Smokeless tobacco: Not on file  . Alcohol Use: Yes    Family History  Problem Relation Age of Onset  . Thyroid disease Mother     No Known Allergies  Medication list has been reviewed and updated.  Current Outpatient Prescriptions on File Prior to Visit  Medication Sig Dispense Refill  . lisinopril-hydrochlorothiazide (PRINZIDE,ZESTORETIC) 20-12.5 MG per tablet Take 1 tablet by mouth daily.  1 tablet  5  . aspirin 81 MG tablet Take 81 mg by mouth daily.        .  cyclobenzaprine (FLEXERIL) 10 MG tablet Take 1 tablet (10 mg total) by mouth 3 (three) times daily as needed for muscle spasms.  30 tablet  0  . HYDROcodone-acetaminophen (NORCO) 5-325 MG per tablet Take 1-2 tablets by mouth every 4 (four) hours as needed for pain.  40 tablet  0  . naproxen sodium (ANAPROX DS) 550 MG tablet Take 1 tablet (550 mg total) by mouth 2 (two) times daily with a meal.  40 tablet  0  . PredniSONE 10 MG KIT Take all tabs for one day with breakfast as directed  Disp 1 pack of 48  1 kit  0   No current facility-administered medications on file prior to visit.    Review of Systems:  As per HPI- otherwise negative.   Physical Examination: Filed Vitals:   06/07/12 0849  BP: 126/70  Pulse: 94  Temp: 98.3 F (36.8 C)  Resp: 16   Filed Vitals:   06/07/12 0849  Height: 5' 10.5" (1.791 m)  Weight: 177 lb 12.8 oz (80.65 kg)   Body mass index is 25.14 kg/(m^2). Ideal Body Weight: Weight in (lb) to have BMI = 25: 176.4  GEN: WDWN, NAD, Non-toxic, A & O  x 3 HEENT: Atraumatic, Normocephalic. Neck supple. No masses, No Robinson.  Bilateral TM wnl, oropharynx normal.  PEERL,EOMI.   No axillary or supraclavicular nodes palpated Ears and Nose: No external deformity. CV: RRR, No M/G/R. No JVD. No thrill. No extra heart sounds. PULM: CTA B, no wheezes, crackles, rhonchi. No retractions. No resp. distress. No accessory muscle use. EXTR: No c/c/e NEURO Normal gait.  PSYCH: Normally interactive. Conversant. Not depressed or anxious appearing.  Calm demeanor.  On the back of his neck/ occipital region is a small, tender, superficial papule which appears consistent with a tiny sebaceous cyst.  There is a black pore in the center  VC obtained: cleaned area on the back of his head over small cyst.  Anesthesia with a small wheal of 1% lidocaine.  Made a very small incision with an 11 blade and expressed a small amount of thick material.    Assessment and Plan: Sebaceous cyst  I  and D as above.  He "felt better already."  Follow- up as needed.    Signed Abbe Amsterdam, MD

## 2012-06-29 ENCOUNTER — Ambulatory Visit
Admission: RE | Admit: 2012-06-29 | Discharge: 2012-06-29 | Disposition: A | Discharge status: Home / Self Care | Payer: BC Managed Care – PPO | Source: Ambulatory Visit | Attending: Emergency Medicine | Admitting: Emergency Medicine

## 2012-06-29 ENCOUNTER — Telehealth: Payer: Self-pay | Admitting: Radiology

## 2012-06-29 DIAGNOSIS — M545 Low back pain, unspecified: Secondary | ICD-10-CM

## 2012-06-29 DIAGNOSIS — M5432 Sciatica, left side: Secondary | ICD-10-CM

## 2012-06-29 NOTE — Telephone Encounter (Signed)
Message copied by Caffie Damme on Tue Jun 29, 2012  4:44 PM ------      Message from: [place], [place] S      Created: Tue Jun 29, 2012  2:58 PM       He should follow up with neurosurgery.  Can you schedule?? ------

## 2012-06-29 NOTE — Telephone Encounter (Signed)
Yes, referral made

## 2012-08-17 ENCOUNTER — Emergency Department (HOSPITAL_COMMUNITY): Payer: BC Managed Care – PPO

## 2012-08-17 ENCOUNTER — Encounter (HOSPITAL_COMMUNITY): Payer: Self-pay | Admitting: Radiology

## 2012-08-17 ENCOUNTER — Emergency Department (HOSPITAL_COMMUNITY)
Admission: EM | Admit: 2012-08-17 | Discharge: 2012-08-17 | Disposition: A | Discharge status: Home / Self Care | Payer: BC Managed Care – PPO | Attending: Emergency Medicine | Admitting: Emergency Medicine

## 2012-08-17 DIAGNOSIS — Z8669 Personal history of other diseases of the nervous system and sense organs: Secondary | ICD-10-CM | POA: Insufficient documentation

## 2012-08-17 DIAGNOSIS — Z8719 Personal history of other diseases of the digestive system: Secondary | ICD-10-CM | POA: Insufficient documentation

## 2012-08-17 DIAGNOSIS — M549 Dorsalgia, unspecified: Secondary | ICD-10-CM

## 2012-08-17 DIAGNOSIS — M545 Low back pain, unspecified: Secondary | ICD-10-CM | POA: Insufficient documentation

## 2012-08-17 DIAGNOSIS — N509 Disorder of male genital organs, unspecified: Secondary | ICD-10-CM | POA: Insufficient documentation

## 2012-08-17 DIAGNOSIS — N50819 Testicular pain, unspecified: Secondary | ICD-10-CM

## 2012-08-17 DIAGNOSIS — R109 Unspecified abdominal pain: Secondary | ICD-10-CM | POA: Insufficient documentation

## 2012-08-17 DIAGNOSIS — I10 Essential (primary) hypertension: Secondary | ICD-10-CM | POA: Insufficient documentation

## 2012-08-17 DIAGNOSIS — F17200 Nicotine dependence, unspecified, uncomplicated: Secondary | ICD-10-CM | POA: Insufficient documentation

## 2012-08-17 DIAGNOSIS — Z7982 Long term (current) use of aspirin: Secondary | ICD-10-CM | POA: Insufficient documentation

## 2012-08-17 DIAGNOSIS — Z8546 Personal history of malignant neoplasm of prostate: Secondary | ICD-10-CM | POA: Insufficient documentation

## 2012-08-17 LAB — BASIC METABOLIC PANEL
Chloride: 97 mEq/L (ref 96–112)
GFR calc Af Amer: >90 mL/min (ref >90)
GFR calc non Af Amer: >90 mL/min (ref >90)
Potassium: 3.5 mEq/L (ref 3.5–5.1)
Sodium: 136 mEq/L (ref 135–145)

## 2012-08-17 LAB — CBC WITH DIFFERENTIAL/PLATELET
Basophils Absolute: 0 10*3/uL (ref 0.0–0.1)
Basophils Relative: 1 % (ref 0–1)
Eosinophils Absolute: 0.1 10*3/uL (ref 0.0–0.7)
Hemoglobin: 15 g/dL (ref 13.0–17.0)
MCHC: 35.3 g/dL (ref 30.0–36.0)
Monocytes Relative: 9 % (ref 3–12)
Neutro Abs: 2.1 10*3/uL (ref 1.7–7.7)
Neutrophils Relative %: 59 % (ref 43–77)
Platelets: 179 10*3/uL (ref 150–400)

## 2012-08-17 MED ORDER — ONDANSETRON HCL 4 MG/2ML IJ SOLN
4.0000 mg | Freq: Once | INTRAMUSCULAR | Status: AC
  Administered 2012-08-17: 4 mg via INTRAVENOUS
  Filled 2012-08-17: qty 2

## 2012-08-17 MED ORDER — CIPROFLOXACIN HCL 500 MG PO TABS: 500.0000 mg | ORAL_TABLET | Freq: Two times a day (BID) | ORAL | Status: DC

## 2012-08-17 MED ORDER — MORPHINE SULFATE 4 MG/ML IJ SOLN
4.0000 mg | Freq: Once | INTRAMUSCULAR | Status: AC
  Administered 2012-08-17: 4 mg via INTRAVENOUS
  Filled 2012-08-17: qty 1

## 2012-08-17 MED ORDER — HYDROCODONE-ACETAMINOPHEN 5-325 MG PO TABS: 2.0000 | ORAL_TABLET | ORAL | Status: DC | PRN

## 2012-08-17 NOTE — ED Provider Notes (Addendum)
History     CSN: [id number]  Arrival date & time 08/17/12  4098   First MD Initiated Contact with Patient 08/17/12 [phone]      No chief complaint on file.   (Consider location/radiation/quality/duration/timing/severity/associated sxs/prior treatment) HPI Comments: Patient presents to the ER for evaluation of severe back and testicle pain. Patient reports that the symptoms began approximately at 7:15AM today. The pain started suddenly. Reports sharp stabbing pains are severe under the testicles. No nausea or vomiting.  Patient does report that he has a history of low back pain for the last 4 weeks. He was seen multiple times for this, had an MRI. He reports that their surgery told him that was not present before surgery. He is taking pain medication for this, it is not helping.   Past Medical History  Diagnosis Date  . Hypertension   . GERD (gastroesophageal reflux disease)   . Prostate cancer 07/11/2004  . Cataract     Past Surgical History  Procedure Laterality Date  . Prostatectomy  08/2004    2nd to CA  . Cataract extraction  2011  . Eye surgery      Family History  Problem Relation Age of Onset  . Thyroid disease Mother     History  Substance Use Topics  . Smoking status: Current Some Day Smoker -- 0.30 packs/day    Types: Cigarettes    Last Attempt to Quit: 03/03/2010  . Smokeless tobacco: Not on file  . Alcohol Use: Yes      Review of Systems  Constitutional: Negative for fever.  Gastrointestinal: Negative for abdominal pain.  Genitourinary: Positive for testicular pain.  Musculoskeletal: Positive for back pain.    Allergies  Review of patient's allergies indicates no known allergies.  Home Medications   Current Outpatient Rx  Name  Route  Sig  Dispense  Refill  . aspirin 81 MG tablet   Oral   Take 81 mg by mouth daily.           . cyclobenzaprine (FLEXERIL) 10 MG tablet   Oral   Take 1 tablet (10 mg total) by mouth 3 (three) times daily as  needed for muscle spasms.   30 tablet   0   . HYDROcodone-acetaminophen (NORCO) 5-325 MG per tablet   Oral   Take 1-2 tablets by mouth every 4 (four) hours as needed for pain.   40 tablet   0   . lisinopril-hydrochlorothiazide (PRINZIDE,ZESTORETIC) 20-12.5 MG per tablet   Oral   Take 1 tablet by mouth daily.   1 tablet   5   . naproxen sodium (ANAPROX DS) 550 MG tablet   Oral   Take 1 tablet (550 mg total) by mouth 2 (two) times daily with a meal.   40 tablet   0   . PredniSONE 10 MG KIT      Take all tabs for one day with breakfast as directed  Disp 1 pack of 48   1 kit   0     BP 141/109  Pulse 100  Temp(Src) 97.9 F (36.6 C) (Oral)  Resp 18  SpO2 100%  Physical Exam  Constitutional: He is oriented to person, place, and time. He appears well-developed and well-nourished. No distress.  HENT:  Head: Normocephalic and atraumatic.  Right Ear: Hearing normal.  Left Ear: Hearing normal.  Nose: Nose normal.  Mouth/Throat: Oropharynx is clear and moist and mucous membranes are normal.  Eyes: Conjunctivae and EOM are normal. Pupils are equal,  round, and reactive to light.  Neck: Normal range of motion. Neck supple.  Cardiovascular: Regular rhythm, S1 normal and S2 normal.  Exam reveals no gallop and no friction rub.   No murmur heard. Pulmonary/Chest: Effort normal and breath sounds normal. No respiratory distress. He exhibits no tenderness.  Abdominal: Soft. Normal appearance and bowel sounds are normal. There is no hepatosplenomegaly. There is no tenderness. There is no rebound, no guarding, no tenderness at McBurney's point and negative Murphy's sign. No hernia. Hernia confirmed negative in the right inguinal area and confirmed negative in the left inguinal area.  Genitourinary: Penis normal. Right testis shows tenderness. Right testis shows no mass and no swelling. Left testis shows tenderness. Left testis shows no mass and no swelling.  Musculoskeletal: Normal range  of motion.  Neurological: He is alert and oriented to person, place, and time. He has normal strength. No cranial nerve deficit or sensory deficit. Coordination normal. GCS eye subscore is 4. GCS verbal subscore is 5. GCS motor subscore is 6.  Skin: Skin is warm, dry and intact. No rash noted. No cyanosis.  Psychiatric: He has a normal mood and affect. His speech is normal and behavior is normal. Thought content normal.    ED Course  Procedures (including critical care time)  Labs Reviewed  CBC WITH DIFFERENTIAL - Abnormal; Notable for the following:    WBC 3.6 (*)    All other components within normal limits  BASIC METABOLIC PANEL - Abnormal; Notable for the following:    Glucose, Bld 133 (*)    All other components within normal limits  URINALYSIS, ROUTINE W REFLEX MICROSCOPIC   Ct Abdomen Pelvis Wo Contrast  08/17/2012   *RADIOLOGY REPORT*  Clinical Data: Sudden onset of bilateral testicular pain. Left flank pain.  History prostate cancer and prostatectomy.  CT ABDOMEN AND PELVIS WITHOUT CONTRAST  Technique:  Multidetector CT imaging of the abdomen and pelvis was performed following the standard protocol without intravenous contrast.  Comparison: 06/29/2012 the lumbar spine MR.  08/09/2008 CT.  Findings: Bilateral tiny nonobstructing renal calculi.  No evidence of ureteral calculi. No CT evidence of renal collecting system obstruction.  Evaluation of solid abdominal viscera is limited by lack of IV contrast.  Taking this limitation into account no worrisome hepatic, splenic, pancreatic, adrenal or renal lesion.  Fatty liver with tiny calcification. No calcified gallstones.  Scattered colonic diverticula.  No extraluminal bowel inflammatory process, free fluid or free air.  Atherosclerotic type changes of the aorta without aneurysmal dilation.  Atherosclerotic type changes iliac arteries.  Post prostatectomy.  No pelvic adenopathy.  Noncontrast filled views of the urinary bladder with  circumferential thickening which may be related to under distension without obvious mass identified.  No worrisome osseous lesion. Degenerative changes with various degrees of spinal stenosis and foraminal narrowing.  Scarring lung bases.  Into aortocaval lymph node with short axis dimension of 11.5 mm without change.  IMPRESSION: Bilateral tiny nonobstructing renal calculi.  No evidence of ureteral calculi. No CT evidence of renal collecting system obstruction.  Fatty liver with tiny calcification.  Scattered colonic diverticula.  No extraluminal bowel inflammatory process, free fluid or free air.  Atherosclerotic type changes of the aorta without aneurysmal dilation.  Atherosclerotic type changes iliac arteries.  Post prostatectomy.  No pelvic adenopathy.  Noncontrast filled views of the urinary bladder with circumferential thickening which may be related to under distension without obvious mass identified.  No worrisome osseous lesion. Degenerative changes with various degrees of spinal  stenosis and foraminal narrowing.  Into aortocaval lymph node with short axis dimension of 11.5 mm without change.   Original Report Authenticated By: Lacy Duverney, M.D.   US Scrotum  08/17/2012   *RADIOLOGY REPORT*  Clinical Data:  Scrotal pain greater on the left.  Sided and sharp.  SCROTAL ULTRASOUND DOPPLER ULTRASOUND OF THE TESTICLES  Technique: Complete ultrasound examination of the testicles, epididymis, and other scrotal structures was performed.  Color and spectral Doppler ultrasound were also utilized to evaluate blood flow to the testicles.  Comparison:  CT performed same date.  Findings:  Right testis:  4.6 x 2.1 x 3.5 cm.  No dominant mass.  Left testis:  4.7 x 2.6 x 3.0 cm.  No dominant mass although diffuse slight increased heterogeneity.  Right epididymis:  Normal in size and appearance.  Left epididymis:  Normal in size and appearance.  Hydrocele:  Tiny left hydrocele.  Varicocele:  Very small right varicocele.   Pulsed Doppler interrogation of both testes demonstrates low resistance flow bilaterally.  IMPRESSION: No evidence of testicular torsion, epididymitis or orchitis.  Tiny left hydrocele and very small right varicocele.  Slight heterogeneous appearance of the echotexture of the left testicle without dominant mass identified.  Etiology/significance of this appearance is indeterminate.   Original Report Authenticated By: Lacy Duverney, M.D.   [place] Art/ven Flow Abd Pelv Doppler  08/17/2012   *RADIOLOGY REPORT*  Clinical Data:  Scrotal pain greater on the left.  Sided and sharp.  SCROTAL ULTRASOUND DOPPLER ULTRASOUND OF THE TESTICLES  Technique: Complete ultrasound examination of the testicles, epididymis, and other scrotal structures was performed.  Color and spectral Doppler ultrasound were also utilized to evaluate blood flow to the testicles.  Comparison:  CT performed same date.  Findings:  Right testis:  4.6 x 2.1 x 3.5 cm.  No dominant mass.  Left testis:  4.7 x 2.6 x 3.0 cm.  No dominant mass although diffuse slight increased heterogeneity.  Right epididymis:  Normal in size and appearance.  Left epididymis:  Normal in size and appearance.  Hydrocele:  Tiny left hydrocele.  Varicocele:  Very small right varicocele.  Pulsed Doppler interrogation of both testes demonstrates low resistance flow bilaterally.  IMPRESSION: No evidence of testicular torsion, epididymitis or orchitis.  Tiny left hydrocele and very small right varicocele.  Slight heterogeneous appearance of the echotexture of the left testicle without dominant mass identified.  Etiology/significance of this appearance is indeterminate.   Original Report Authenticated By: Lacy Duverney, M.D.      diagnosis: 1. Back pain 2. Testicle pain    MDM  Patient presents to the ER for evaluation of bilateral testicle pain, left greater than right. Symptoms began somewhat suddenly this morning. Patient has no swelling. There is tenderness diffusely on  examination. Patient's blood work is unremarkable. Normal white blood cell count, normal renal function. CAT scan without contrast was performed does not show any ureterolithiasis or hydronephrosis. Patient underwent ultrasound testicle with pelvic Doppler and no evidence of torsion present. There is some abnormality in the texture of the left testicle seen on ultrasound, will refer to urology. Treat empirically for possible epididymitis. 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 Patient has chronic left radiculopathy that is unchanged. Patient has normal strength and sensation in the lower extremities. No saddle anesthesia.        Gilda Crease, MD 08/17/12 1049  Gilda Crease, MD 08/30/12 1540

## 2012-08-17 NOTE — ED Notes (Signed)
Pt present to ed with c/o bilateral testicle pain. Pt sts went to work this morning and suddenly developed severe pain in both testicles with radiation and tingling in legs.

## 2012-09-23 ENCOUNTER — Ambulatory Visit (INDEPENDENT_AMBULATORY_CARE_PROVIDER_SITE_OTHER): Payer: BC Managed Care – PPO | Admitting: Physician Assistant

## 2012-09-23 VITALS — BP 130/70 | HR 80 | Temp 97.8°F | Resp 16 | Ht 70.0 in | Wt 172.6 lb

## 2012-09-23 DIAGNOSIS — I10 Essential (primary) hypertension: Secondary | ICD-10-CM

## 2012-09-23 MED ORDER — LISINOPRIL-HYDROCHLOROTHIAZIDE 20-12.5 MG PO TABS: 1.0000 | ORAL_TABLET | Freq: Every day | ORAL | Status: DC

## 2012-09-23 NOTE — Progress Notes (Signed)
   [address] [place] [address]   Phone [phone]  Subjective:    Patient ID: Perry Robinson, male    DOB: Oct 08, 1950, 62 y.o.   MRN: [id number]  HPI Pt presents to clinic for BP med refill.  He has been doing good.  He is on vacation this week.  He has been having back problems and has an appt next week with someone in [place] - he had an MRI which showed a pinched nerve and he went to Dr Lovell Sheehan and was out through PT but has found no relief from his symptoms. He plans to retire in August 2015.  Review of Systems  Constitutional: Negative for fever and chills.  Respiratory: Negative for shortness of breath.   Cardiovascular: Negative for chest pain.       Objective:   Physical Exam  Vitals reviewed. Constitutional: He is oriented to person, place, and time. He appears well-developed and well-nourished.  HENT:  Head: Normocephalic and atraumatic.  Right Ear: External ear normal.  Left Ear: External ear normal.  Eyes: Conjunctivae are normal.  Neck: Normal range of motion.  Cardiovascular: Normal rate, regular rhythm and normal heart sounds.   No murmur heard. Pulmonary/Chest: Effort normal and breath sounds normal.  Neurological: He is alert and oriented to person, place, and time.  Skin: Skin is warm and dry.  Psychiatric: He has a normal mood and affect. His behavior is normal. Judgment and thought content normal.          Assessment & Plan:  HTN (hypertension) - Plan: lisinopril-hydrochlorothiazide (PRINZIDE,ZESTORETIC) 20-12.5 MG per tablet  Continue current meds.  Recheck for CPE in 6 months.  Benny Lennert PA-C 09/23/2012 11:20 AM

## 2012-09-30 NOTE — Progress Notes (Signed)
Left msg for pt to schedule 6 month CPE appt with Maralyn Sago.

## 2012-10-05 NOTE — Progress Notes (Signed)
CPE scheduled for January 2015.

## 2012-10-25 ENCOUNTER — Encounter: Payer: Self-pay | Admitting: Physician Assistant

## 2012-11-09 ENCOUNTER — Ambulatory Visit (INDEPENDENT_AMBULATORY_CARE_PROVIDER_SITE_OTHER): Payer: BC Managed Care – PPO | Admitting: Physician Assistant

## 2012-11-09 VITALS — BP 170/80 | HR 90 | Temp 98.3°F | Resp 20 | Ht 69.0 in | Wt 173.8 lb

## 2012-11-09 DIAGNOSIS — M549 Dorsalgia, unspecified: Secondary | ICD-10-CM

## 2012-11-09 MED ORDER — PREDNISONE 10 MG PO TABS: ORAL_TABLET | ORAL | Status: AC

## 2012-11-09 NOTE — Progress Notes (Signed)
   [address], [place] [address]   Phone [phone]  Subjective:    Patient ID: Perry Robinson, male    DOB: 1950-11-06, 62 y.o.   MRN: [id number]  HPI  Pt presents to clinic with continued back pain.  Started earlier this year and has seen Dr Lovell Sheehan who ordered PT.  Pt went to #7 1hr long sessions and never got any relief and when he talked to the head PT about his response he never heard back about another appt or the next step.  He has not had any care for his back since June but he is getting concerned because his pain is getting worse.  The pain is mid back and radiates with a electric/stinging sensation down his L leg and recently he has had similar sensation going down his R leg.  The electric sensation goes down through his buttocks and down the lateral aspect of his leg and into his calf - it does not go into his foot.  He has the pain all the time but it does get worse after he sits for longs periods of time.  He works on [employer], [employer] and he has a big job coming up and he is concerned about his pain.  Aleve has not helped - just upset his stomach so he stopped taking it after a few doses.  He would like a 2nd opinion about why his pain has not improved and if there is anything that can be done other than PT.  We saw him in 3/14 and he was placed on prednisone but he does not remember if it helped.  Review of Systems  Musculoskeletal: Positive for back pain.  Neurological: Negative for weakness.       Objective:   Physical Exam  Vitals reviewed. Constitutional: He is oriented to person, place, and time. He appears well-developed and well-nourished.  HENT:  Head: Normocephalic and atraumatic.  Right Ear: External ear normal.  Left Ear: External ear normal.  Nose: Mucosal edema present.  Cardiovascular: Normal rate, regular rhythm and normal heart sounds.   No murmur heard. Pulmonary/Chest: Effort normal and breath sounds normal.  Musculoskeletal:   Lumbar back: He exhibits bony tenderness. He exhibits normal range of motion and no spasm.       Back:  Neurological: He is alert and oriented to person, place, and time. He has normal strength. No sensory deficit.  Reflex Scores:      Patellar reflexes are 2+ on the right side and 2+ on the left side.      Achilles reflexes are 2+ on the right side and 2+ on the left side. Skin: Skin is warm and dry.  Psychiatric: He has a normal mood and affect. His behavior is normal. Judgment and thought content normal.        Assessment & Plan:  Back pain with radiation - Plan: predniSONE (DELTASONE) 10 MG tablet, Ambulatory referral to Orthopedic Surgery  Pt has a normal exam but with MRI results and continuing to worsen symptoms we will try another prednisone taper.  We will refer to [facility] - ? Stenosis getting worse with central pain and now radiation to both sides with an electric sensation I think that there must be nerve involvement even if it is just from inflammation around the nerve.  Benny Lennert PA-C 11/09/2012 10:30 AM

## 2013-02-03 ENCOUNTER — Other Ambulatory Visit: Payer: Self-pay | Admitting: Urology

## 2013-02-03 DIAGNOSIS — C61 Malignant neoplasm of prostate: Secondary | ICD-10-CM

## 2013-03-01 ENCOUNTER — Ambulatory Visit (HOSPITAL_COMMUNITY): Payer: BC Managed Care – PPO

## 2013-03-01 ENCOUNTER — Encounter (HOSPITAL_COMMUNITY): Payer: BC Managed Care – PPO

## 2013-03-23 ENCOUNTER — Encounter: Payer: Self-pay | Admitting: Physician Assistant

## 2013-03-23 ENCOUNTER — Ambulatory Visit (INDEPENDENT_AMBULATORY_CARE_PROVIDER_SITE_OTHER): Payer: BC Managed Care – PPO | Admitting: Physician Assistant

## 2013-03-23 VITALS — BP 122/80 | HR 98 | Temp 97.7°F | Resp 16 | Ht 69.0 in | Wt 166.2 lb

## 2013-03-23 DIAGNOSIS — L299 Pruritus, unspecified: Secondary | ICD-10-CM

## 2013-03-23 DIAGNOSIS — Z0000 Encounter for general adult medical examination without abnormal findings: Secondary | ICD-10-CM

## 2013-03-23 DIAGNOSIS — M549 Dorsalgia, unspecified: Secondary | ICD-10-CM

## 2013-03-23 DIAGNOSIS — R631 Polydipsia: Secondary | ICD-10-CM

## 2013-03-23 DIAGNOSIS — R21 Rash and other nonspecific skin eruption: Secondary | ICD-10-CM

## 2013-03-23 DIAGNOSIS — Z1211 Encounter for screening for malignant neoplasm of colon: Secondary | ICD-10-CM

## 2013-03-23 DIAGNOSIS — I10 Essential (primary) hypertension: Secondary | ICD-10-CM

## 2013-03-23 LAB — CBC
HCT: 43 % (ref 39.0–52.0)
Hemoglobin: 15.2 g/dL (ref 13.0–17.0)
MCH: 32 pg (ref 26.0–34.0)
MCHC: 35.3 g/dL (ref 30.0–36.0)
MCV: 90.5 fL (ref 78.0–100.0)
PLATELETS: 197 10*3/uL (ref 150–400)
RBC: 4.75 MIL/uL (ref 4.22–5.81)
RDW: 13.8 % (ref 11.5–15.5)
WBC: 3.3 10*3/uL — ABNORMAL LOW (ref 4.0–10.5)

## 2013-03-23 LAB — POCT URINALYSIS DIPSTICK
BILIRUBIN UA: NEGATIVE
GLUCOSE UA: NEGATIVE
Ketones, UA: NEGATIVE
LEUKOCYTES UA: NEGATIVE
NITRITE UA: NEGATIVE
PH UA: 8.5
Protein, UA: NEGATIVE
Spec Grav, UA: 1.015
Urobilinogen, UA: 1

## 2013-03-23 LAB — GLUCOSE, POCT (MANUAL RESULT ENTRY): POC GLUCOSE: 90 mg/dL (ref 70–99)

## 2013-03-23 MED ORDER — LISINOPRIL-HYDROCHLOROTHIAZIDE 20-12.5 MG PO TABS: 1.0000 | ORAL_TABLET | Freq: Every day | ORAL | Status: DC

## 2013-03-23 MED ORDER — TRIAMCINOLONE ACETONIDE 0.5 % EX CREA: 1.0000 "application " | TOPICAL_CREAM | Freq: Two times a day (BID) | CUTANEOUS | Status: DC

## 2013-03-23 MED ORDER — CETIRIZINE HCL 10 MG PO TABS: 10.0000 mg | ORAL_TABLET | Freq: Every day | ORAL | Status: DC

## 2013-03-24 LAB — COMPLETE METABOLIC PANEL WITH GFR
ALBUMIN: 4.4 g/dL (ref 3.5–5.2)
ALK PHOS: 57 U/L (ref 39–117)
ALT: 20 U/L (ref 0–53)
AST: 31 U/L (ref 0–37)
BUN: 12 mg/dL (ref 6–23)
CO2: 25 mEq/L (ref 19–32)
Calcium: 9.2 mg/dL (ref 8.4–10.5)
Chloride: 106 mEq/L (ref 96–112)
Creat: 0.76 mg/dL (ref 0.50–1.35)
GFR, Est African American: >89 mL/min
GFR, Est Non African American: >89 mL/min
Glucose, Bld: 99 mg/dL (ref 70–99)
POTASSIUM: 4.2 meq/L (ref 3.5–5.3)
Sodium: 142 mEq/L (ref 135–145)
Total Bilirubin: 0.6 mg/dL (ref 0.3–1.2)
Total Protein: 6.9 g/dL (ref 6.0–8.3)

## 2013-03-24 LAB — LIPID PANEL
CHOL/HDL RATIO: 3.4 ratio
Cholesterol: 221 mg/dL — ABNORMAL HIGH (ref 0–200)
HDL: 65 mg/dL (ref >39)
LDL Cholesterol: 120 mg/dL — ABNORMAL HIGH (ref 0–99)
Triglycerides: 181 mg/dL — ABNORMAL HIGH (ref <150)
VLDL: 36 mg/dL (ref 0–40)

## 2013-03-24 LAB — TSH: TSH: 0.698 u[IU]/mL (ref 0.350–4.500)

## 2013-03-24 NOTE — Progress Notes (Signed)
Subjective:    Patient ID: Perry Robinson, male    DOB: 1950/04/27, 63 y.o.   MRN: [id number]  HPI Pt presents to clinic for a CPE.  He is doing well.  His last visit with his urologist was in October and his next appt is in March.  He is not sure what his exact PSA was but he was supposed to have a bone scan but did not do to insurance. He has had a cold for the last several weeks but he is getting better.  He has been itching and has a rash on his L lower leg that is improving but just wanted me to look at it. Last eye exam was 2014.  Review of Systems  Constitutional: Negative.   HENT: Positive for congestion.   Eyes: Negative.   Respiratory: Negative.   Cardiovascular: Negative.   Gastrointestinal: Negative.   Endocrine: Positive for polydipsia (feels more thirst recently).  Genitourinary: Positive for frequency (slight increase in urinartion but he ? whether he is drinking more water. He is not having trouble getting his stream started.). Negative for dysuria and difficulty urinating.  Musculoskeletal: Negative.   Skin: Positive for rash (itchy skin all over for about 3 weeks - rash on L medial ankle tha tis getting better).  Allergic/Immunologic: Negative.   Neurological: Negative.   Hematological: Negative.   Psychiatric/Behavioral: Negative.        Objective:   Physical Exam  Vitals reviewed. Constitutional: He is oriented to person, place, and time. He appears well-developed and well-nourished.  HENT:  Head: Normocephalic and atraumatic.  Right Ear: Hearing, tympanic membrane, external ear and ear canal normal.  Left Ear: Hearing, tympanic membrane, external ear and ear canal normal.  Nose: Mucosal edema (pale and swollen) present.  Mouth/Throat: Uvula is midline, oropharynx is clear and moist and mucous membranes are normal.  Eyes: Conjunctivae and EOM are normal. Pupils are equal, round, and reactive to light.  Neck: Normal range of motion. Neck supple. No  thyromegaly present.  Cardiovascular: Normal rate, regular rhythm and normal heart sounds.   No murmur heard. Pulmonary/Chest: Effort normal and breath sounds normal. He has no wheezes.  Abdominal: Soft. Bowel sounds are normal.  Musculoskeletal: Normal range of motion.  Lymphadenopathy:    He has no cervical adenopathy.  Neurological: He is alert and oriented to person, place, and time. He has normal reflexes.  Skin: Skin is warm and dry. Rash (no rash on arms - on medial left leg - mild erythematous rash with dry appearance (pt patient gettin better)) noted.  Psychiatric: He has a normal mood and affect. His behavior is normal. Judgment and thought content normal.   EKG - no T wave changes, NSR     Assessment & Plan:  PE (physical exam), annual - Plan: CBC, Lipid panel, TSH, POCT urinalysis dipstick, COMPLETE METABOLIC PANEL WITH GFR  Increased thirst - Normal glucose - pt to monitor - Plan: POCT glucose (manual entry)  Hypertension - Continue current meds because good control. Plan: EKG 12-Lead, Plan: lisinopril-hydrochlorothiazide (PRINZIDE,ZESTORETIC) 20-12.5 MG per tablet  Screening for colon cancer - Plan: IFOBT POC (occult bld, rslt in office), Ambulatory referral to Gastroenterology  Back pain - Pt has seen Dr Arnoldo Morale and has done 7 sessions of PT and was unhappy with his visit.  He still continues to have pain that radiates down his Left leg and now he is starting to have a burning sensation down his right leg.  Plan: Ambulatory referral  to Orthopedic Surgery - he filled out a medical release to get his records from Dr Arnoldo Morale.  Itching - Pt to use a humidifier in his bedroom to help with dry skin.  He already uses a mild soap and he should use moisturizer.  Plan: cetirizine (ZYRTEC) 10 MG tablet  Rash and nonspecific skin eruption - Appears to be a patch of dry skin - Plan: triamcinolone cream (KENALOG) 0.5 % - if this makes it worse he will call and I will change to  antifungal.    Windell Hummingbird PA-C 03/24/2013 11:17 AM

## 2013-03-28 LAB — IFOBT (OCCULT BLOOD): IFOBT: POSITIVE

## 2013-04-07 ENCOUNTER — Encounter: Payer: Self-pay | Admitting: Internal Medicine

## 2013-04-13 ENCOUNTER — Encounter: Payer: Self-pay | Admitting: Physician Assistant

## 2013-04-13 DIAGNOSIS — M5410 Radiculopathy, site unspecified: Secondary | ICD-10-CM | POA: Insufficient documentation

## 2013-05-16 ENCOUNTER — Ambulatory Visit (AMBULATORY_SURGERY_CENTER): Payer: Self-pay | Admitting: *Deleted

## 2013-05-16 VITALS — Ht 71.5 in | Wt 170.4 lb

## 2013-05-16 DIAGNOSIS — Z1211 Encounter for screening for malignant neoplasm of colon: Secondary | ICD-10-CM

## 2013-05-16 MED ORDER — MOVIPREP 100 G PO SOLR: ORAL | Status: DC

## 2013-05-16 NOTE — Progress Notes (Signed)
Patient denies any allergies to eggs or soy. Patient denies any problems with anesthesia.  

## 2013-05-30 ENCOUNTER — Ambulatory Visit (AMBULATORY_SURGERY_CENTER): Payer: BC Managed Care – PPO | Admitting: Internal Medicine

## 2013-05-30 ENCOUNTER — Encounter: Payer: Self-pay | Admitting: Internal Medicine

## 2013-05-30 VITALS — BP 143/79 | HR 79 | Temp 98.1°F | Resp 22 | Ht 71.0 in | Wt 170.0 lb

## 2013-05-30 DIAGNOSIS — Z1211 Encounter for screening for malignant neoplasm of colon: Secondary | ICD-10-CM

## 2013-05-30 DIAGNOSIS — D126 Benign neoplasm of colon, unspecified: Secondary | ICD-10-CM

## 2013-05-30 DIAGNOSIS — D129 Benign neoplasm of anus and anal canal: Secondary | ICD-10-CM

## 2013-05-30 DIAGNOSIS — D128 Benign neoplasm of rectum: Secondary | ICD-10-CM

## 2013-05-30 MED ORDER — SODIUM CHLORIDE 0.9 % IV SOLN: 500.0000 mL | INTRAVENOUS | Status: DC

## 2013-05-30 NOTE — Op Note (Signed)
[place]  [address]. [place], [address]   COLONOSCOPY PROCEDURE REPORT  PATIENT: Manley, Fason  MR#: [id number] BIRTHDATE: 10/25/1950 , 83  yrs. old GENDER: Male ENDOSCOPIST: Jerene Bears, MD REFERRED BT:DVVOH Gale Journey, M.D. PROCEDURE DATE:  05/30/2013 PROCEDURE:   Colonoscopy with snare polypectomy First Screening Colonoscopy - Avg.  risk and is 50 yrs.  old or older Yes.  Prior Negative Screening - Now for repeat screening. N/A  History of Adenoma - Now for follow-up colonoscopy & has been > or = to 3 yrs.  N/A  Polyps Removed Today? Yes. ASA CLASS:   Class III INDICATIONS:average risk screening and first colonoscopy. MEDICATIONS: MAC sedation, administered by CRNA and Propofol (Diprivan) 280 mg IV  DESCRIPTION OF PROCEDURE:   After the risks benefits and alternatives of the procedure were thoroughly explained, informed consent was obtained.  A digital rectal exam revealed several skin tags.   The LB [id number]  endoscope was introduced through the anus and advanced to the cecum, which was identified by both the appendix and ileocecal valve. No adverse events experienced. The quality of the prep was good, using MoviPrep  The instrument was then slowly withdrawn as the colon was fully examined.    COLON FINDINGS: Two sessile polyps measuring 5-6 mm in size were found in the distal transverse colon.  Polypectomy was performed using cold snare.  All resections were complete and all polyp tissue was completely retrieved.   Two sessile polyps ranging between 3-16mm in size were found in the rectosigmoid colon. Polypectomy was performed using cold snare.  All resections were complete and all polyp tissue was completely retrieved.   There was mild diverticulosis noted in the sigmoid colon with associated angulation requiring changing to pediatric colonoscope.  There were mild changes of radiation proctitis (scattered ectatic vessels) in the rectum  without bleeding.  Retroflexed views revealed no other abnormalities. The time to cecum=6 minutes 58 seconds.  Withdrawal time=13 minutes 30 seconds.  The scope was withdrawn and the procedure completed.  COMPLICATIONS: There were no complications.  ENDOSCOPIC IMPRESSION: 1.   Two sessile polyps measuring 5-6 mm in size were found in the distal transverse colon; Polypectomy was performed using cold snare  2.   Two sessile polyps ranging between 3-39mm in size were found in the rectosigmoid colon; Polypectomy was performed using cold snare 3.   There was mild diverticulosis noted in the sigmoid colon 4.   Mild, perhaps early, radiation proctitis    RECOMMENDATIONS: 1.  Await pathology results 2.  High fiber diet 3.  Timing of repeat colonoscopy will be determined by pathology findings. 4.  You will receive a letter within 1-2 weeks with the results of your biopsy as well as final recommendations.  Please call my office if you have not received a letter after 3 weeks.   eSigned:  Jerene Bears, MD 05/30/2013 9:24 AM   cc: The Patient and Windell Hummingbird PA   PATIENT NAME:  Jaysten, Essner MR#: [id number]

## 2013-05-30 NOTE — Progress Notes (Signed)
Procedure ends, to recovery, report given and VSS. 

## 2013-05-30 NOTE — Patient Instructions (Signed)
YOU HAD AN ENDOSCOPIC PROCEDURE TODAY AT THE [facility]: Refer to the procedure report that was given to you for any specific questions about what was found during the examination.  If the procedure report does not answer your questions, please call your gastroenterologist to clarify.  If you requested that your care partner not be given the details of your procedure findings, then the procedure report has been included in a sealed envelope for you to review at your convenience later.  YOU SHOULD EXPECT: Some feelings of bloating in the abdomen. Passage of more gas than usual.  Walking can help get rid of the air that was put into your GI tract during the procedure and reduce the bloating. If you had a lower endoscopy (such as a colonoscopy or flexible sigmoidoscopy) you may notice spotting of blood in your stool or on the toilet paper. If you underwent a bowel prep for your procedure, then you may not have a normal bowel movement for a few days.  DIET: Your first meal following the procedure should be a light meal and then it is ok to progress to your normal diet.  A half-sandwich or bowl of soup is an example of a good first meal.  Heavy or fried foods are harder to digest and may make you feel nauseous or bloated.  Likewise meals heavy in dairy and vegetables can cause extra gas to form and this can also increase the bloating.  Drink plenty of fluids but you should avoid alcoholic beverages for 24 hours.  Try to increase the fiber in your diet to prevent Diverticulitis.  ACTIVITY: Your care partner should take you home directly after the procedure.  You should plan to take it easy, moving slowly for the rest of the day.  You can resume normal activity the day after the procedure however you should NOT DRIVE or use heavy machinery for 24 hours (because of the sedation medicines used during the test).    SYMPTOMS TO REPORT IMMEDIATELY: A gastroenterologist can be reached at any hour.  During  normal business hours, 8:30 AM to 5:00 PM Monday through Friday, call [phone].  After hours and on weekends, please call the GI answering service at [phone] who will take a message and have the physician on call contact you.   Following lower endoscopy (colonoscopy or flexible sigmoidoscopy):  Excessive amounts of blood in the stool  Significant tenderness or worsening of abdominal pains  Swelling of the abdomen that is new, acute  Fever of 100F or higher FOLLOW UP: If any biopsies were taken you will be contacted by phone or by letter within the next 1-3 weeks.  Call your gastroenterologist if you have not heard about the biopsies in 3 weeks.  Our staff will call the home number listed on your records the next business day following your procedure to check on you and address any questions or concerns that you may have at that time regarding the information given to you following your procedure. This is a courtesy call and so if there is no answer at the home number and we have not heard from you through the emergency physician on call, we will assume that you have returned to your regular daily activities without incident.  SIGNATURES/CONFIDENTIALITY: You and/or your care partner have signed paperwork which will be entered into your electronic medical record.  These signatures attest to the fact that that the information above on your After Visit Summary has been reviewed   reviewed and is understood.  Full responsibility of the confidentiality of this discharge information lies with you and/or your care-partner. 

## 2013-05-30 NOTE — Progress Notes (Signed)
Called to room to assist during endoscopic procedure.  Patient ID and intended procedure confirmed with present staff. Received instructions for my participation in the procedure from the performing physician.  

## 2013-05-31 ENCOUNTER — Telehealth: Payer: Self-pay

## 2013-05-31 NOTE — Telephone Encounter (Signed)
Left a message at pt's work [phone] with Kasandra Knudsen to tell the pt to call us at [facility] if any questions or problems. Maw

## 2013-06-04 ENCOUNTER — Encounter: Payer: Self-pay | Admitting: Physician Assistant

## 2013-06-05 ENCOUNTER — Encounter: Payer: Self-pay | Admitting: Internal Medicine

## 2013-06-08 NOTE — Telephone Encounter (Signed)
Error message

## 2013-06-09 ENCOUNTER — Encounter: Payer: Self-pay | Admitting: Physician Assistant

## 2013-06-09 DIAGNOSIS — K5730 Diverticulosis of large intestine without perforation or abscess without bleeding: Secondary | ICD-10-CM | POA: Insufficient documentation

## 2013-07-11 ENCOUNTER — Encounter: Payer: Self-pay | Admitting: Physician Assistant

## 2013-09-21 ENCOUNTER — Ambulatory Visit: Payer: BC Managed Care – PPO | Admitting: Physician Assistant

## 2013-12-28 ENCOUNTER — Other Ambulatory Visit: Payer: Self-pay | Admitting: Urology

## 2013-12-28 DIAGNOSIS — C61 Malignant neoplasm of prostate: Secondary | ICD-10-CM

## 2014-01-09 ENCOUNTER — Encounter (HOSPITAL_COMMUNITY)
Admission: RE | Admit: 2014-01-09 | Discharge: 2014-01-09 | Disposition: A | Discharge status: Home / Self Care | Payer: BC Managed Care – PPO | Source: Ambulatory Visit | Attending: Urology | Admitting: Urology

## 2014-01-09 DIAGNOSIS — C61 Malignant neoplasm of prostate: Secondary | ICD-10-CM | POA: Insufficient documentation

## 2014-01-09 MED ORDER — TECHNETIUM TC 99M MEDRONATE IV KIT
26.0000 | PACK | Freq: Once | INTRAVENOUS | Status: AC | PRN
  Administered 2014-01-09: 26 via INTRAVENOUS

## 2014-01-19 ENCOUNTER — Encounter: Payer: Self-pay | Admitting: Internal Medicine

## 2014-01-19 ENCOUNTER — Ambulatory Visit: Payer: Self-pay

## 2014-01-19 DIAGNOSIS — Z006 Encounter for examination for normal comparison and control in clinical research program: Secondary | ICD-10-CM

## 2014-02-13 ENCOUNTER — Telehealth: Payer: Self-pay | Admitting: *Deleted

## 2014-02-13 NOTE — Telephone Encounter (Signed)
Called left message in voice mail to call back to let the office know if he had the flu vaccine.

## 2014-03-27 ENCOUNTER — Other Ambulatory Visit: Payer: Self-pay | Admitting: Physician Assistant

## 2014-05-09 ENCOUNTER — Other Ambulatory Visit: Payer: Self-pay | Admitting: Urology

## 2014-05-09 ENCOUNTER — Ambulatory Visit (INDEPENDENT_AMBULATORY_CARE_PROVIDER_SITE_OTHER): Payer: BLUE CROSS/BLUE SHIELD | Admitting: Family Medicine

## 2014-05-09 VITALS — BP 102/50 | HR 81 | Temp 97.6°F | Resp 16 | Ht 70.5 in | Wt 148.0 lb

## 2014-05-09 DIAGNOSIS — I10 Essential (primary) hypertension: Secondary | ICD-10-CM | POA: Diagnosis not present

## 2014-05-09 DIAGNOSIS — C61 Malignant neoplasm of prostate: Secondary | ICD-10-CM

## 2014-05-09 MED ORDER — LISINOPRIL 20 MG PO TABS: 20.0000 mg | ORAL_TABLET | Freq: Every day | ORAL | Status: DC

## 2014-05-09 NOTE — Patient Instructions (Signed)
If you feel lightheaded or dizzy when sitting or standing up to quickly it could because your blood pressure is to low.  Check your blood pressure 1-2x/wk and call if you are concerned about being to high or to low. When you come back, please be FASTING so we can check your cholesterol.  Hypotension As your heart beats, it forces blood through your arteries. This force is your blood pressure. If your blood pressure is too low for you to go about your normal activities or to support the organs of your body, you have hypotension. Hypotension is also referred to as low blood pressure. When your blood pressure becomes too low, you may not get enough blood to your brain. As a result, you may feel weak, feel lightheaded, or develop a rapid heart rate. In a more severe case, you may faint. CAUSES Various conditions can cause hypotension. These include:  Blood loss.  Dehydration.  Heart or endocrine problems.  Pregnancy.  Severe infection.  Not having a well-balanced diet filled with needed nutrients.  Severe allergic reactions (anaphylaxis). Some medicines, such as blood pressure medicine or water pills (diuretics), may lower your blood pressure below normal. Sometimes taking too much medicine or taking medicine not as directed can cause hypotension. TREATMENT  Hospitalization is sometimes required for hypotension if fluid or blood replacement is needed, if time is needed for medicines to wear off, or if further monitoring is needed. Treatment might include changing your diet, changing your medicines (including medicines aimed at raising your blood pressure), and use of support stockings. HOME CARE INSTRUCTIONS   Drink enough fluids to keep your urine clear or pale yellow.  Take your medicines as directed by your health care provider.  Get up slowly from reclining or sitting positions. This gives your blood pressure a chance to adjust.  Wear support stockings as directed by your health care  provider.  Maintain a healthy diet by including nutritious food, such as fruits, vegetables, nuts, whole grains, and lean meats. SEEK MEDICAL CARE IF:  You have vomiting or diarrhea.  You have a fever for more than 2-3 days.  You feel more thirsty than usual.  You feel weak and tired. SEEK IMMEDIATE MEDICAL CARE IF:   You have chest pain or a fast or irregular heartbeat.  You have a loss of feeling in some part of your body, or you lose movement in your arms or legs.  You have trouble speaking.  You become sweaty or feel lightheaded.  You faint. MAKE SURE YOU:   Understand these instructions.  Will watch your condition.  Will get help right away if you are not doing well or get worse. Document Released: 02/17/2005 Document Revised: 12/08/2012 Document Reviewed: 08/20/2012 [facility] Patient Information 2015 [place], [place]. This information is not intended to replace advice given to you by your health care provider. Make sure you discuss any questions you have with your health care provider.

## 2014-05-09 NOTE — Progress Notes (Signed)
This chart was scribed for [name], MD by Einar Pheasant, Medical Scribe. This patient was seen in room 10 and the patient's care was started at 8:35 AM.  Subjective:    Patient ID: Perry Robinson, male    DOB: 1950-04-17, 64 y.o.   MRN: [id number]  Chief Complaint  Patient presents with  . Medication Refill    Lisinopril    HPI Perry Robinson is a 64 y.o. male with PMhx of HTN and prostate CA in 2006 with a prostatectomy in 2006 and radiation in 2009. He did have cystitis and hematuria after the radiation which stopped after stopping Asprin. In 2014 his PSA began slowly increasing and got up to 2.2 this past October. Dr Jeffie Pollock at [facility] urology [facility] trial at that time.   Today, pt presents to the office requesting a medication refill on his lisinopril. Pt states he is doing very well. Pt was asked about his weight loss and pt states that he was told by his past Provider to that his PSA levels were rising too quickly (towards 2) so he was placed on Enzalutamide 40 mg. At the time he did not have any urinary symptoms, however, now he finds himself getting up 3-4 times a night to urinate. He states that he has also noticed that he experiences some light-headedness when he stands up too quickly. After ample discussion we have agreed to take the pt off Lisinopril HTCZ for now. Perry Robinson agrees to the propose course of treatment. Pt has not noted any negative changes with his prostate or loss of appetite. Other than the symptoms stated above, pt does not endorse any other associated symptoms.   Pt does not have a regular exercise regiment and he also does not take any vitamin supplements. The last time pt had blood drawn was in February. Pt sees the office who placed him on the Enzalutaminde 40 mg every 3 months.   Pt was advised to come in for a fasting physical but he deferred it to a later day due to his upcoming work schedule. We agreed to have the office call him later this week to  set up a date.   Patient Active Problem List   Diagnosis Date Noted  . Diverticulosis of colon 06/09/2013  . Back pain with left-sided radiculopathy 04/13/2013  . Hypertension   . Prostate cancer    Past Medical History  Diagnosis Date  . Hypertension   . GERD (gastroesophageal reflux disease)   . Prostate cancer 07/11/2004  . Cataract    Past Surgical History  Procedure Laterality Date  . Prostatectomy  08/2004    2nd to CA  . Cataract extraction  2011  . Eye surgery    . Rotator cuff repair  10/2008   No Known Allergies Prior to Admission medications   Medication Sig Start Date End Date Taking? Authorizing Provider  lisinopril-hydrochlorothiazide (PRINZIDE,ZESTORETIC) 20-12.5 MG per tablet TAKE 1 TABLET BY MOUTH DAILY.  "NEEDS OFFICE VISIT FOR ADDITIONAL REFILLS" 03/28/14   Mancel Bale, PA-C   History   Social History  . Marital Status: Married    Spouse Name: N/A  . Number of Children: N/A  . Years of Education: N/A   Occupational History  . Not on file.   Social History Main Topics  . Smoking status: Current Every Day Smoker -- 0.50 packs/day for 10 years    Types: Cigarettes    Last Attempt to Quit: 03/03/2010  . Smokeless tobacco: Never Used  .  Alcohol Use: 16.8 oz/week    28 Cans of beer per week     Comment: "4 beers a day" per patient.   . Drug Use: No  . Sexual Activity: Yes   Other Topics Concern  . Not on file   Social History Narrative   Married. Education; high school. Exercise: twice weekly     Review of Systems  Constitutional: Negative for fever, chills and appetite change.  HENT: Negative for rhinorrhea and sore throat.   Eyes: Negative for visual disturbance.  Respiratory: Negative for cough and shortness of breath.   Cardiovascular: Negative for chest pain and leg swelling.  Gastrointestinal: Negative for nausea, vomiting, abdominal pain and diarrhea.  Genitourinary: Positive for frequency. Negative for dysuria.  Musculoskeletal:  Negative for back pain and neck pain.  Skin: Negative for rash.  Neurological: Positive for light-headedness. Negative for dizziness and headaches.     BP 102/50 mmHg  Pulse 81  Temp(Src) 97.6 F (36.4 C) (Oral)  Resp 16  Ht 5' 10.5" (1.791 m)  Wt 148 lb (67.132 kg)  BMI 20.93 kg/m2  SpO2 98%  Objective:   Physical Exam  Constitutional: He is oriented to person, place, and time. He appears well-developed and well-nourished. No distress.  HENT:  Head: Normocephalic and atraumatic.  Right Ear: External ear normal.  Left Ear: External ear normal.  Nose: Nose normal.  Eyes: Conjunctivae and EOM are normal. Right eye exhibits no discharge. Left eye exhibits no discharge.  Neck: Normal range of motion. Neck supple. No thyromegaly present.  Cardiovascular: Normal rate and normal heart sounds.  An irregular rhythm present. Exam reveals no gallop and no friction rub.   No murmur heard. Pulmonary/Chest: Effort normal and breath sounds normal. No respiratory distress.  Abdominal: Soft.  Musculoskeletal: Normal range of motion. He exhibits no edema or tenderness.  Lymphadenopathy:    He has no cervical adenopathy.  Neurological: He is alert and oriented to person, place, and time.  Skin: Skin is warm and dry.  Psychiatric: He has a normal mood and affect. His behavior is normal. Thought content normal.  Nursing note and vitals reviewed.   Filed Weights   05/09/14 0821  Weight: 148 lb (67.132 kg)   EKG: normal EKG, normal sinus rhythm, no ischemic changes.   Assessment & Plan:   Essential hypertension - Plan: EKG 12-Lead Removed hctz portion of the lisinopril-hctz 20-12.5 Hopefully this will help resolve his HYPOptension and orthostatic lightheadeness sxs. Warned of poss lisinopril side effects inc cough, angioedema.  Pt will check home BP 1-2x/wk and record to bring to f/u. Reviewed techniques at home to increase accuracy. Nees flp at next OV. Meds ordered this encounter    Medications  . lisinopril (PRINIVIL,ZESTRIL) 20 MG tablet    Sig: Take 1 tablet (20 mg total) by mouth daily.    Dispense:  90 tablet    Refill:  3    I personally performed the services described in this documentation, which was scribed in my presence. The recorded information has been reviewed and considered, and addended by me as needed.  [name], MD MPH

## 2014-05-16 NOTE — Progress Notes (Signed)
Left message for patient to call to schedule appointment with Windell Hummingbird or Dr Brigitte Pulse in 3-6 months for a physical.

## 2014-05-31 NOTE — Progress Notes (Signed)
lmom to give [place] a call back for a CPE with Windell Hummingbird in 3-6 months

## 2014-06-07 ENCOUNTER — Encounter: Payer: Self-pay | Admitting: Family Medicine

## 2014-06-07 NOTE — Progress Notes (Signed)
Letter sent about appointment

## 2014-07-06 ENCOUNTER — Encounter (HOSPITAL_COMMUNITY)
Admission: RE | Admit: 2014-07-06 | Discharge: 2014-07-06 | Disposition: A | Discharge status: Home / Self Care | Payer: BLUE CROSS/BLUE SHIELD | Source: Ambulatory Visit | Attending: Urology | Admitting: Urology

## 2014-07-06 DIAGNOSIS — C61 Malignant neoplasm of prostate: Secondary | ICD-10-CM | POA: Insufficient documentation

## 2014-07-06 MED ORDER — TECHNETIUM TC 99M MEDRONATE IV KIT
27.1000 | PACK | Freq: Once | INTRAVENOUS | Status: AC | PRN
  Administered 2014-07-06: 27.1 via INTRAVENOUS

## 2014-09-05 ENCOUNTER — Other Ambulatory Visit: Payer: Self-pay | Admitting: Urology

## 2014-09-05 DIAGNOSIS — C61 Malignant neoplasm of prostate: Secondary | ICD-10-CM

## 2014-11-27 ENCOUNTER — Ambulatory Visit (INDEPENDENT_AMBULATORY_CARE_PROVIDER_SITE_OTHER): Payer: BLUE CROSS/BLUE SHIELD | Admitting: Internal Medicine

## 2014-11-27 ENCOUNTER — Encounter: Payer: Self-pay | Admitting: Internal Medicine

## 2014-11-27 ENCOUNTER — Ambulatory Visit (INDEPENDENT_AMBULATORY_CARE_PROVIDER_SITE_OTHER): Payer: BLUE CROSS/BLUE SHIELD

## 2014-11-27 VITALS — BP 134/66 | HR 89 | Temp 98.2°F | Resp 16 | Ht 70.5 in | Wt 157.6 lb

## 2014-11-27 DIAGNOSIS — R202 Paresthesia of skin: Secondary | ICD-10-CM | POA: Diagnosis not present

## 2014-11-27 DIAGNOSIS — M47812 Spondylosis without myelopathy or radiculopathy, cervical region: Secondary | ICD-10-CM

## 2014-11-27 DIAGNOSIS — M542 Cervicalgia: Secondary | ICD-10-CM

## 2014-11-27 DIAGNOSIS — M79609 Pain in unspecified limb: Secondary | ICD-10-CM

## 2014-11-27 MED ORDER — PREDNISONE 10 MG PO TABS: ORAL_TABLET | ORAL | Status: DC

## 2014-11-27 MED ORDER — METHOCARBAMOL 750 MG PO TABS: 750.0000 mg | ORAL_TABLET | Freq: Four times a day (QID) | ORAL | Status: DC

## 2014-11-27 NOTE — Patient Instructions (Signed)

## 2014-11-27 NOTE — Progress Notes (Signed)
Patient ID: Perry Robinson, male   DOB: 02-12-1951, 64 y.o.   MRN: [id number]   11/27/2014 at 10:07 AM  Perry Robinson / DOB: 1950/08/17 / MRN: [id number]  Problem list reviewed and updated by me where necessary.   SUBJECTIVE  Perry Robinson is a 64 y.o. well appearing male presenting for the chief complaint of left arm ache left neck ache and pain with neck extention. Also has tingling and off and on numbness left arm and hand. No weakness or incontinence. Has no chest pain nausea or fatigue. Continues working full time no problem   He  has a past medical history of Hypertension; GERD (gastroesophageal reflux disease); Prostate cancer (07/11/2004); and Cataract.    Medications reviewed and updated by myself where necessary, and exist elsewhere in the encounter.   Perry Robinson has No Known Allergies. He  reports that he has been smoking Cigarettes.  He has a 5 pack-year smoking history. He has never used smokeless tobacco. He reports that he drinks about 16.8 oz of alcohol per week. He reports that he does not use illicit drugs. He  reports that he currently engages in sexual activity. The patient  has past surgical history that includes Prostatectomy (08/2004); Cataract extraction (2011); Eye surgery; and Rotator cuff repair (10/2008).  His family history includes Thyroid disease in his mother. There is no history of Colon cancer.  Review of Systems  Constitutional: Negative for fever.  Respiratory: Negative for shortness of breath.   Cardiovascular: Negative for chest pain.  Gastrointestinal: Negative for nausea.  Skin: Negative for rash.  Neurological: Negative for dizziness and headaches.    OBJECTIVE  His  height is 5' 10.5" (1.791 m) and weight is 157 lb 9.6 oz (71.487 kg). His oral temperature is 98.2 F (36.8 C). His blood pressure is 134/66 and his pulse is 89. His respiration is 16 and oxygen saturation is 98%.  The patient's body mass index is 22.29  kg/(m^2).  Physical Exam  Constitutional: He is oriented to person, place, and time. He appears well-developed and well-nourished. No distress.  HENT:  Head: Normocephalic.  Nose: Nose normal.  Eyes: Conjunctivae and EOM are normal.  Respiratory: Effort normal.  Musculoskeletal:       Right shoulder: He exhibits decreased range of motion and decreased strength.       Cervical back: He exhibits decreased range of motion, tenderness, bony tenderness, pain and spasm. He exhibits no swelling, no edema, no deformity, no laceration and normal pulse.  Left shoulder chronic weakness from large rotator cuff tear years ago.  Neurological: He is alert and oriented to person, place, and time. He has normal strength. No cranial nerve deficit or sensory deficit. He exhibits normal muscle tone. Coordination normal.  Positive sperling on left  Has normal function for him left arm.  Psychiatric: He has a normal mood and affect.   [facility] reading (PRIMARY) by  Dr Elder Cyphers DD, osteoarthritis, spur formation worse C-4--C7   No results found for this or any previous visit (from the past 24 hour(s)).  ASSESSMENT & PLAN  Perry Robinson was seen today for neck pain and tingling.  Diagnoses and all orders for this visit:  Neck pain, acute -     DG Cervical Spine Complete; Future -     methocarbamol (ROBAXIN-750) 750 MG tablet; Take 1 tablet (750 mg total) by mouth 4 (four) times daily. -     predniSONE (DELTASONE) 10 MG tablet; Take two po BID for three days,  then take one po BID for 7 days for nerve symptoms in neck  Paresthesia and pain of left extremity -     DG Cervical Spine Complete; Future -     methocarbamol (ROBAXIN-750) 750 MG tablet; Take 1 tablet (750 mg total) by mouth 4 (four) times daily. -     predniSONE (DELTASONE) 10 MG tablet; Take two po BID for three days, then take one po BID for 7 days for nerve symptoms in neck  Osteoarthritis of neck -     methocarbamol (ROBAXIN-750) 750 MG tablet; Take 1  tablet (750 mg total) by mouth 4 (four) times daily. -     predniSONE (DELTASONE) 10 MG tablet; Take two po BID for three days, then take one po BID for 7 days for nerve symptoms in neck

## 2014-12-26 ENCOUNTER — Encounter (HOSPITAL_COMMUNITY)
Admission: RE | Admit: 2014-12-26 | Discharge: 2014-12-26 | Disposition: A | Discharge status: Home / Self Care | Payer: Self-pay | Source: Ambulatory Visit | Attending: Urology | Admitting: Urology

## 2014-12-26 DIAGNOSIS — C61 Malignant neoplasm of prostate: Secondary | ICD-10-CM | POA: Insufficient documentation

## 2014-12-26 MED ORDER — TECHNETIUM TC 99M MEDRONATE IV KIT
25.0000 | PACK | Freq: Once | INTRAVENOUS | Status: AC | PRN
  Administered 2014-12-26: 25 via INTRAVENOUS

## 2015-02-16 ENCOUNTER — Ambulatory Visit (INDEPENDENT_AMBULATORY_CARE_PROVIDER_SITE_OTHER): Payer: BLUE CROSS/BLUE SHIELD | Admitting: Physician Assistant

## 2015-02-16 DIAGNOSIS — M5030 Other cervical disc degeneration, unspecified cervical region: Secondary | ICD-10-CM

## 2015-02-16 DIAGNOSIS — M5410 Radiculopathy, site unspecified: Secondary | ICD-10-CM

## 2015-02-16 NOTE — Progress Notes (Signed)
   Perry Robinson  MRN: [id number] DOB: [birth year]  Subjective:  Pt presents to clinic for recheck of neck pain and left arm weakness and loss of sensation.  He was seen 9/26 for the same problem and he feels like it has continued to get worse.  He has had to change some of his job duties because he is unable to lift and hold things in his left hand.  He has switched from drinking out of glasses to plastic because the glasses are to heavy in his left hand.  He finds that he is really not using his left hand because of the weakness.  He has increase pain down his left arm when he looks to the right and he has increased pain with this movement also.  He is still having the back pain with intermittent radiation into the left leg but it is not as had as the neck and left arm.  He would like to be seen for possible surgery because this is decreasing his quality of life.  Patient Active Problem List   Diagnosis Date Noted  . Diverticulosis of colon 06/09/2013  . Back pain with left-sided radiculopathy 04/13/2013  . Hypertension   . Prostate cancer [facility])     Current Outpatient Prescriptions on File Prior to Visit  Medication Sig Dispense Refill  . lisinopril (PRINIVIL,ZESTRIL) 20 MG tablet Take 1 tablet (20 mg total) by mouth daily. 90 tablet 3   No current facility-administered medications on file prior to visit.    No Known Allergies  Review of Systems  Musculoskeletal: Positive for neck pain.   Objective:  BP 120/68 mmHg  Pulse 86  Temp(Src) 98.2 F (36.8 C) (Oral)  Resp 18  Ht 5' 10.5" (1.791 m)  Wt 164 lb (74.39 kg)  BMI 23.19 kg/m2  SpO2 97%  Physical Exam  Constitutional: He is oriented to person, place, and time and well-developed, well-nourished, and in no distress.  HENT:  Head: Normocephalic and atraumatic.  Right Ear: External ear normal.  Left Ear: External ear normal.  Eyes: Conjunctivae are normal.  Neck: Normal range of motion.  Pulmonary/Chest: Effort  normal.  Musculoskeletal:       Cervical back: He exhibits decreased range of motion (decreased general ROM - ), tenderness and pain (when patient is looking to the right he has pain down his left arm).  Neurological: He is alert and oriented to person, place, and time. He displays weakness. A sensory deficit is present. Gait normal.  Weakness in shoulder and arm strength and hand grip strength is decreased - sensation is decreased in C6-C7 with monofilament testing on the left side  Skin: Skin is warm and dry.  Psychiatric: Mood, memory, affect and judgment normal.    Assessment and Plan :  Back pain with left-sided radiculopathy - Plan: Ambulatory referral to Neurosurgery  Degenerative disc disease, cervical - Plan: Ambulatory referral to Neurosurgery, MR Cervical Spine Wo Contrast - I ordered without contrast because the patient had a normal NM bone body scan in 10/25 after his symptoms had already started.  He will continue the tumeric while waiting on his referrals because nothing else has really helped his symptoms.  Windell Hummingbird PA-C  [facility] 02/16/2015 11:08 AM

## 2015-02-21 ENCOUNTER — Other Ambulatory Visit: Payer: BLUE CROSS/BLUE SHIELD

## 2015-02-23 ENCOUNTER — Telehealth: Payer: Self-pay

## 2015-02-23 NOTE — Telephone Encounter (Signed)
Spoke with Dr Zada Girt - peer to peer review - neck pain radiculopathy should be acceptable for this exam to be covered - the case was closed and he had to open the case again and he will call back with the authorization number today ot ny the latest Tuesday.

## 2015-02-23 NOTE — Telephone Encounter (Signed)
The patient's MRI was denied by [employer].  The denial was issued for the following reason: "MRI is not appropriate for evaluation of upper extremity weakness unless nerve root damage is demonstrated on physical exam or other testing."  If there is clinical documentation of this damage, please contact [employer] for an appeal.  Prior to this entry, I had attempted authorization based on diagnoses of DDD, weakness, and signs and symptoms noted by the patient.  Each attempt did not satisfy the criteria.  Clinical review via peer-to-peer is the only option at this time.  [employer] #: [phone]  Perry Robinson DOB: Aug 30, 1950 BCBS ID: [id number] CPT Code for MRI Cervical Spine Wo Contrast: [id number] ICD-10: M50.30 (ICD-10-CM) - Degenerative disc disease, cervical

## 2015-02-23 NOTE — Telephone Encounter (Signed)
MRI Authorization #: [id number] 02/23/15-03/04/15 for [place]

## 2015-02-23 NOTE — Telephone Encounter (Signed)
Sent order (along with the authorization number) to [facility] so he can be scheduled. 02/23/15

## 2015-03-03 ENCOUNTER — Ambulatory Visit
Admission: RE | Admit: 2015-03-03 | Discharge: 2015-03-03 | Disposition: A | Discharge status: Home / Self Care | Payer: BLUE CROSS/BLUE SHIELD | Source: Ambulatory Visit | Attending: Physician Assistant | Admitting: Physician Assistant

## 2015-03-03 DIAGNOSIS — M5030 Other cervical disc degeneration, unspecified cervical region: Secondary | ICD-10-CM

## 2015-03-19 ENCOUNTER — Other Ambulatory Visit: Payer: Self-pay | Admitting: Urology

## 2015-03-19 DIAGNOSIS — C61 Malignant neoplasm of prostate: Secondary | ICD-10-CM

## 2015-04-08 ENCOUNTER — Encounter (HOSPITAL_COMMUNITY): Payer: Self-pay | Admitting: Emergency Medicine

## 2015-04-08 ENCOUNTER — Emergency Department (HOSPITAL_COMMUNITY)
Admission: EM | Admit: 2015-04-08 | Discharge: 2015-04-08 | Disposition: A | Discharge status: Home / Self Care | Payer: Managed Care, Other (non HMO) | Attending: Emergency Medicine | Admitting: Emergency Medicine

## 2015-04-08 DIAGNOSIS — M5010 Cervical disc disorder with radiculopathy, unspecified cervical region: Secondary | ICD-10-CM | POA: Insufficient documentation

## 2015-04-08 DIAGNOSIS — Z79899 Other long term (current) drug therapy: Secondary | ICD-10-CM | POA: Insufficient documentation

## 2015-04-08 DIAGNOSIS — Z8669 Personal history of other diseases of the nervous system and sense organs: Secondary | ICD-10-CM | POA: Insufficient documentation

## 2015-04-08 DIAGNOSIS — M5116 Intervertebral disc disorders with radiculopathy, lumbar region: Secondary | ICD-10-CM | POA: Diagnosis not present

## 2015-04-08 DIAGNOSIS — F17210 Nicotine dependence, cigarettes, uncomplicated: Secondary | ICD-10-CM | POA: Diagnosis not present

## 2015-04-08 DIAGNOSIS — Z9849 Cataract extraction status, unspecified eye: Secondary | ICD-10-CM | POA: Diagnosis not present

## 2015-04-08 DIAGNOSIS — I10 Essential (primary) hypertension: Secondary | ICD-10-CM | POA: Insufficient documentation

## 2015-04-08 DIAGNOSIS — Z8719 Personal history of other diseases of the digestive system: Secondary | ICD-10-CM | POA: Insufficient documentation

## 2015-04-08 DIAGNOSIS — Z8546 Personal history of malignant neoplasm of prostate: Secondary | ICD-10-CM | POA: Diagnosis not present

## 2015-04-08 DIAGNOSIS — M5136 Other intervertebral disc degeneration, lumbar region: Secondary | ICD-10-CM

## 2015-04-08 DIAGNOSIS — M5030 Other cervical disc degeneration, unspecified cervical region: Secondary | ICD-10-CM

## 2015-04-08 DIAGNOSIS — M545 Low back pain: Secondary | ICD-10-CM | POA: Diagnosis present

## 2015-04-08 DIAGNOSIS — M5410 Radiculopathy, site unspecified: Secondary | ICD-10-CM

## 2015-04-08 LAB — URINE MICROSCOPIC-ADD ON

## 2015-04-08 LAB — URINALYSIS, ROUTINE W REFLEX MICROSCOPIC
BILIRUBIN URINE: NEGATIVE
Glucose, UA: NEGATIVE mg/dL
HGB URINE DIPSTICK: NEGATIVE
KETONES UR: NEGATIVE mg/dL
NITRITE: NEGATIVE
PROTEIN: 30 mg/dL — AB
Specific Gravity, Urine: 1.026 (ref 1.005–1.030)
pH: 8 (ref 5.0–8.0)

## 2015-04-08 MED ORDER — PREDNISONE 20 MG PO TABS: ORAL_TABLET | ORAL | Status: DC

## 2015-04-08 MED ORDER — ONDANSETRON HCL 4 MG/2ML IJ SOLN
4.0000 mg | Freq: Once | INTRAMUSCULAR | Status: AC
  Administered 2015-04-08: 4 mg via INTRAVENOUS
  Filled 2015-04-08: qty 2

## 2015-04-08 MED ORDER — FENTANYL CITRATE (PF) 100 MCG/2ML IJ SOLN
50.0000 ug | Freq: Once | INTRAMUSCULAR | Status: AC
  Administered 2015-04-08: 50 ug via INTRAVENOUS
  Filled 2015-04-08: qty 2

## 2015-04-08 MED ORDER — PREDNISONE 20 MG PO TABS
60.0000 mg | ORAL_TABLET | Freq: Once | ORAL | Status: AC
  Administered 2015-04-08: 60 mg via ORAL
  Filled 2015-04-08: qty 3

## 2015-04-08 MED ORDER — HYDROCODONE-ACETAMINOPHEN 5-325 MG PO TABS: 1.0000 | ORAL_TABLET | Freq: Four times a day (QID) | ORAL | Status: DC | PRN

## 2015-04-08 MED ORDER — LORAZEPAM 2 MG/ML IJ SOLN
1.0000 mg | Freq: Once | INTRAMUSCULAR | Status: AC
  Administered 2015-04-08: 1 mg via INTRAVENOUS
  Filled 2015-04-08: qty 1

## 2015-04-08 MED ORDER — HYDROMORPHONE HCL 1 MG/ML IJ SOLN
1.0000 mg | Freq: Once | INTRAMUSCULAR | Status: AC
  Administered 2015-04-08: 1 mg via INTRAVENOUS
  Filled 2015-04-08: qty 1

## 2015-04-08 NOTE — ED Notes (Signed)
Bed: [id number] Expected date: 04/08/15 Expected time: 11:01 AM Means of arrival: Ambulance Comments: Rt lower back and leg pain

## 2015-04-08 NOTE — ED Notes (Signed)
Pt from home with Right sided lower back pain that started this am.  He has had sciatica in the past and this feels like that.  Pt c/o pain 10/10 but refused pain meds from EMS.  BP168/103  Hx HTN

## 2015-04-08 NOTE — ED Provider Notes (Addendum)
CSN: [id number]     Arrival date & time 04/08/15  1059 History   First MD Initiated Contact with Patient 04/08/15 1112     Chief Complaint  Patient presents with  . Back Pain     (Consider location/radiation/quality/duration/timing/severity/associated sxs/prior Treatment) The history is provided by the patient.  Patient with hx low back pain x 2 years, ddd, presents indicating increased low back pain radiating to right leg in past day.  States has had similar pain in past. States feels like his former sciatic pain. Pain constant, dull, moderate-severe, worse w certain movements and position changes. No saddle area or leg numbness. No weakness or loss of normal function. No urinary retention, or incontinence.  Denies fever or chills. Works supplying equipment to eye care providers. Denies work related injury or strain.       Past Medical History  Diagnosis Date  . Hypertension   . GERD (gastroesophageal reflux disease)   . Prostate cancer ([place]) 07/11/2004  . Cataract    Past Surgical History  Procedure Laterality Date  . Prostatectomy  08/2004    2nd to CA  . Cataract extraction  2011  . Eye surgery    . Rotator cuff repair  10/2008   Family History  Problem Relation Age of Onset  . Thyroid disease Mother   . Colon cancer Neg Hx    Social History  Substance Use Topics  . Smoking status: Current Every Day Smoker -- 0.50 packs/day for 10 years    Types: Cigarettes    Last Attempt to Quit: 03/03/2010  . Smokeless tobacco: Never Used  . Alcohol Use: 16.8 oz/week    28 Cans of beer per week     Comment: "4 beers a day" per patient.     Review of Systems  Constitutional: Negative for fever and chills.  Respiratory: Negative for shortness of breath.   Cardiovascular: Negative for chest pain.  Gastrointestinal: Negative for abdominal pain.  Genitourinary: Negative for difficulty urinating.  Musculoskeletal: Positive for back pain and neck pain.  Skin: Negative for rash.   Neurological: Negative for weakness and numbness.      Allergies  Review of patient's allergies indicates no known allergies.  Home Medications   Prior to Admission medications   Medication Sig Start Date End Date Taking? Authorizing Provider  lisinopril (PRINIVIL,ZESTRIL) 20 MG tablet Take 1 tablet (20 mg total) by mouth daily. 05/09/14   Shawnee Knapp, MD   BP 168/98 mmHg  Pulse 70  Temp(Src) 97.6 F (36.4 C) (Oral)  Resp 16  SpO2 95% Physical Exam  Constitutional: He is oriented to person, place, and time. He appears well-developed and well-nourished. No distress.  HENT:  Head: Atraumatic.  Eyes: Conjunctivae are normal.  Neck: Neck supple. No tracheal deviation present.  Cardiovascular: Normal rate.   Pulmonary/Chest: Effort normal. No accessory muscle usage. No respiratory distress.  Abdominal: Soft. Bowel sounds are normal. He exhibits no distension. There is no tenderness.  Musculoskeletal: Normal range of motion.  TLS spine non tender. Right lumbar muscular tenderness, tenderness at right sciatic notch.  Distal pulses palp.   Neurological: He is alert and oriented to person, place, and time.  Straight leg raise neg.  Motor intact RLE stre 5/5. sens grossly intact. Symmetric reflexes.   Skin: Skin is warm and dry. No rash noted. He is not diaphoretic.  Psychiatric: He has a normal mood and affect.  Nursing note and vitals reviewed.   ED Course  Procedures (including critical  care time)   Results for orders placed or performed during the hospital encounter of 04/08/15  Urinalysis, Routine w reflex microscopic (not at [facility])  Result Value Ref Range   Color, Urine AMBER (A) YELLOW   APPearance CLEAR CLEAR   Specific Gravity, Urine 1.026 1.005 - 1.030   pH 8.0 5.0 - 8.0   Glucose, UA NEGATIVE NEGATIVE mg/dL   Hgb urine dipstick NEGATIVE NEGATIVE   Bilirubin Urine NEGATIVE NEGATIVE   Ketones, ur NEGATIVE NEGATIVE mg/dL   Protein, ur 30 (A) NEGATIVE mg/dL   Nitrite  NEGATIVE NEGATIVE   Leukocytes, UA TRACE (A) NEGATIVE  Urine microscopic-add on  Result Value Ref Range   Squamous Epithelial / LPF 0-5 (A) NONE SEEN   WBC, UA 0-5 0 - 5 WBC/hpf   RBC / HPF 0-5 0 - 5 RBC/hpf   Bacteria, UA RARE (A) NONE SEEN   Urine-Other MUCOUS PRESENT       MDM   Patient given fentanyl and zofran prior to eval.  Pt indicates pain persists.  Dilaudid 1 mg iv. zofran iv.  Note: pt has ride, does not have to drive.   Reviewed nursing notes and prior charts for additional history.   Reviewed prior charts - hx ddd. Pt indicates has seen, and plans to f/u with Dr Arnoldo Morale.  No new numbness/weakness.   Will try pred for relief radicular symptoms.  ua without suspicion for uti or renal stone.   Note pt reports pain severe, while does appear in no acute distress/appears comfortable.   rx for pain.   Pt currently appears stable for d/c.       Lajean Saver, MD 04/08/15 1320

## 2015-04-08 NOTE — Discharge Instructions (Signed)
It was our pleasure to provide your ER care today - we hope that you feel better.  You urine test appears negative for signs of infection or kidney stone.  Your prior MRIs show degenerative disc disease.    Try to find position of comfort, perhaps lying flat with pillow under knees or on your side with pillow between knees.  Avoid bending at waist, or heavy lifting > 10 lbs for the next week.  Try heat or gentle massage to sore area.   Take prednisone as prescribed.    You may take hydrocodone as need for pain. No driving when taking hydrocodone. Also, do not take tylenol or acetaminophen containing medication when taking hydrocodone.  Follow up with your doctor/back specialist during coming week.  Return to ER if worse, new symptoms, fevers, new numbness/weakness, other concern.  You were given pain medication in the ER - no driving for the next 6 hours.    Degenerative Disk Disease Degenerative disk disease is a condition caused by the changes that occur in spinal disks as you grow older. Spinal disks are soft and compressible disks located between the bones of your spine (vertebrae). These disks act like shock absorbers. Degenerative disk disease can affect the whole spine. However, the neck and lower back are most commonly affected. Many changes can occur in the spinal disks with aging, such as:  The spinal disks may dry and shrink.  Small tears may occur in the tough, outer covering of the disk (annulus).  The disk space may become smaller due to loss of water.  Abnormal growths in the bone (spurs) may occur. This can put pressure on the nerve roots exiting the spinal canal, causing pain.  The spinal canal may become narrowed. RISK FACTORS   Being overweight.  Having a family history of degenerative disk disease.  Smoking.  There is increased risk if you are doing heavy lifting or have a sudden injury. SIGNS AND SYMPTOMS  Symptoms vary from person to person and may  include:  Pain that varies in intensity. Some people have no pain, while others have severe pain. The location of the pain depends on the part of your backbone that is affected.  You will have neck or arm pain if a disk in the neck area is affected.  You will have pain in your back, buttocks, or legs if a disk in the lower back is affected.  Pain that becomes worse while bending, reaching up, or with twisting movements.  Pain that may start gradually and then get worse as time passes. It may also start after a major or minor injury.  Numbness or tingling in the arms or legs. DIAGNOSIS  Your health care provider will ask you about your symptoms and about activities or habits that may cause the pain. He or she may also ask about any injuries, diseases, or treatments you have had. Your health care provider will examine you to check for the range of movement that is possible in the affected area, to check for strength in your extremities, and to check for sensation in the areas of the arms and legs supplied by different nerve roots. You may also have:   An X-ray of the spine.  Other imaging tests, such as MRI. TREATMENT  Your health care provider will advise you on the best plan for treatment. Treatment may include:  Medicines.  Rehabilitation exercises. HOME CARE INSTRUCTIONS   Follow proper lifting and walking techniques as advised by your health  care provider.  Maintain good posture.  Exercise regularly as advised by your health care provider.  Perform relaxation exercises.  Change your sitting, standing, and sleeping habits as advised by your health care provider.  Change positions frequently.  Lose weight or maintain a healthy weight as advised by your health care provider.  Do not use any tobacco products, including cigarettes, chewing tobacco, or electronic cigarettes. If you need help quitting, ask your health care provider.  Wear supportive footwear.  Take medicines  only as directed by your health care provider. SEEK MEDICAL CARE IF:   Your pain does not go away within 1-4 weeks.  You have significant appetite or weight loss. SEEK IMMEDIATE MEDICAL CARE IF:   Your pain is severe.  You notice weakness in your arms, hands, or legs.  You begin to lose control of your bladder or bowel movements.  You have fevers or night sweats. MAKE SURE YOU:   Understand these instructions.  Will watch your condition.  Will get help right away if you are not doing well or get worse.   This information is not intended to replace advice given to you by your health care provider. Make sure you discuss any questions you have with your health care provider.   Document Released: 12/15/2006 Document Revised: 03/10/2014 Document Reviewed: 06/21/2013 Elsevier Interactive Patient Education 2016 Elsevier Inc.  Radicular Pain Radicular pain in either the arm or leg is usually from a bulging or herniated disk in the spine. A piece of the herniated disk may press against the nerves as the nerves exit the spine. This causes pain which is felt at the tips of the nerves down the arm or leg. Other causes of radicular pain may include:  Fractures.  Heart disease.  Cancer.  An abnormal and usually degenerative state of the nervous system or nerves (neuropathy). Diagnosis may require CT or MRI scanning to determine the primary cause.  Nerves that start at the neck (nerve roots) may cause radicular pain in the outer shoulder and arm. It can spread down to the thumb and fingers. The symptoms vary depending on which nerve root has been affected. In most cases radicular pain improves with conservative treatment. Neck problems may require physical therapy, a neck collar, or cervical traction. Treatment may take many weeks, and surgery may be considered if the symptoms do not improve.  Conservative treatment is also recommended for sciatica. Sciatica causes pain to radiate from the  lower back or buttock area down the leg into the foot. Often there is a history of back problems. Most patients with sciatica are better after 2 to 4 weeks of rest and other supportive care. Short term bed rest can reduce the disk pressure considerably. Sitting, however, is not a good position since this increases the pressure on the disk. You should avoid bending, lifting, and all other activities which make the problem worse. Traction can be used in severe cases. Surgery is usually reserved for patients who do not improve within the first months of treatment. Only take over-the-counter or prescription medicines for pain, discomfort, or fever as directed by your caregiver. Narcotics and muscle relaxants may help by relieving more severe pain and spasm and by providing mild sedation. Cold or massage can give significant relief. Spinal manipulation is not recommended. It can increase the degree of disc protrusion. Epidural steroid injections are often effective treatment for radicular pain. These injections deliver medicine to the spinal nerve in the space between the protective covering of  the spinal cord and back bones (vertebrae). Your caregiver can give you more information about steroid injections. These injections are most effective when given within two weeks of the onset of pain.  You should see your caregiver for follow up care as recommended. A program for neck and back injury rehabilitation with stretching and strengthening exercises is an important part of management.  SEEK IMMEDIATE MEDICAL CARE IF:  You develop increased pain, weakness, or numbness in your arm or leg.  You develop difficulty with bladder or bowel control.  You develop abdominal pain.   This information is not intended to replace advice given to you by your health care provider. Make sure you discuss any questions you have with your health care provider.   Document Released: 03/27/2004 Document Revised: 03/10/2014 Document  Reviewed: 09/13/2014 Elsevier Interactive Patient Education 2016 Elsevier Inc.   Back Pain, Adult Back pain is very common in adults.The cause of back pain is rarely dangerous and the pain often gets better over time.The cause of your back pain may not be known. Some common causes of back pain include:  Strain of the muscles or ligaments supporting the spine.  Wear and tear (degeneration) of the spinal disks.  Arthritis.  Direct injury to the back. For many people, back pain may return. Since back pain is rarely dangerous, most people can learn to manage this condition on their own. HOME CARE INSTRUCTIONS Watch your back pain for any changes. The following actions may help to lessen any discomfort you are feeling:  Remain active. It is stressful on your back to sit or stand in one place for long periods of time. Do not sit, drive, or stand in one place for more than 30 minutes at a time. Take short walks on even surfaces as soon as you are able.Try to increase the length of time you walk each day.  Exercise regularly as directed by your health care provider. Exercise helps your back heal faster. It also helps avoid future injury by keeping your muscles strong and flexible.  Do not stay in bed.Resting more than 1-2 days can delay your recovery.  Pay attention to your body when you bend and lift. The most comfortable positions are those that put less stress on your recovering back. Always use proper lifting techniques, including:  Bending your knees.  Keeping the load close to your body.  Avoiding twisting.  Find a comfortable position to sleep. Use a firm mattress and lie on your side with your knees slightly bent. If you lie on your back, put a pillow under your knees.  Avoid feeling anxious or stressed.Stress increases muscle tension and can worsen back pain.It is important to recognize when you are anxious or stressed and learn ways to manage it, such as with  exercise.  Take medicines only as directed by your health care provider. Over-the-counter medicines to reduce pain and inflammation are often the most helpful.Your health care provider may prescribe muscle relaxant drugs.These medicines help dull your pain so you can more quickly return to your normal activities and healthy exercise.  Apply ice to the injured area:  Put ice in a plastic bag.  Place a towel between your skin and the bag.  Leave the ice on for 20 minutes, 2-3 times a day for the first 2-3 days. After that, ice and heat may be alternated to reduce pain and spasms.  Maintain a healthy weight. Excess weight puts extra stress on your back and makes it difficult to  maintain good posture. SEEK MEDICAL CARE IF:  You have pain that is not relieved with rest or medicine.  You have increasing pain going down into the legs or buttocks.  You have pain that does not improve in one week.  You have night pain.  You lose weight.  You have a fever or chills. SEEK IMMEDIATE MEDICAL CARE IF:   You develop new bowel or bladder control problems.  You have unusual weakness or numbness in your arms or legs.  You develop nausea or vomiting.  You develop abdominal pain.  You feel faint.   This information is not intended to replace advice given to you by your health care provider. Make sure you discuss any questions you have with your health care provider.   Document Released: 02/17/2005 Document Revised: 03/10/2014 Document Reviewed: 06/21/2013 Elsevier Interactive Patient Education 2016 [place] therapy can help ease sore, stiff, injured, and tight muscles and joints. Heat relaxes your muscles, which may help ease your pain.  RISKS AND COMPLICATIONS If you have any of the following conditions, do not use heat therapy unless your health care provider has approved:  Poor circulation.  Healing wounds or scarred skin in the area being  treated.  Diabetes, heart disease, or high blood pressure.  Not being able to feel (numbness) the area being treated.  Unusual swelling of the area being treated.  Active infections.  Blood clots.  Cancer.  Inability to communicate pain. This may include young children and people who have problems with their brain function (dementia).  Pregnancy. Heat therapy should only be used on old, pre-existing, or long-lasting (chronic) injuries. Do not use heat therapy on new injuries unless directed by your health care provider. HOW TO USE HEAT THERAPY There are several different kinds of heat therapy, including:  Moist heat pack.  Warm water bath.  Hot water bottle.  Electric heating pad.  Heated gel pack.  Heated wrap.  Electric heating pad. Use the heat therapy method suggested by your health care provider. Follow your health care provider's instructions on when and how to use heat therapy. GENERAL HEAT THERAPY RECOMMENDATIONS  Do not sleep while using heat therapy. Only use heat therapy while you are awake.  Your skin may turn pink while using heat therapy. Do not use heat therapy if your skin turns red.  Do not use heat therapy if you have new pain.  High heat or long exposure to heat can cause burns. Be careful when using heat therapy to avoid burning your skin.  Do not use heat therapy on areas of your skin that are already irritated, such as with a rash or sunburn. SEEK MEDICAL CARE IF:  You have blisters, redness, swelling, or numbness.  You have new pain.  Your pain is worse. MAKE SURE YOU:  Understand these instructions.  Will watch your condition.  Will get help right away if you are not doing well or get worse.   This information is not intended to replace advice given to you by your health care provider. Make sure you discuss any questions you have with your health care provider.   Document Released: 05/12/2011 Document Revised: 03/10/2014 Document  Reviewed: 04/12/2013 Elsevier Interactive Patient Education [employer].

## 2015-04-29 ENCOUNTER — Telehealth: Payer: Self-pay | Admitting: *Deleted

## 2015-04-29 ENCOUNTER — Other Ambulatory Visit: Payer: Self-pay | Admitting: *Deleted

## 2015-04-29 DIAGNOSIS — I10 Essential (primary) hypertension: Secondary | ICD-10-CM

## 2015-04-29 MED ORDER — LISINOPRIL 20 MG PO TABS: 20.0000 mg | ORAL_TABLET | Freq: Every day | ORAL | Status: DC

## 2015-04-29 NOTE — Telephone Encounter (Signed)
Pt came to office requesting refill on Lisinopril.  Pt was last seen for HTN in March 2016 with Dr. Brigitte Pulse.  Pt states he has surgery in the next few days and will be unable to drive for 30 days.  Pt had 8 pills left in current bottle.  Spoke with Harrison Mons; she looked over his chart and gave the okay for 1 refill of 30 tablets.  Pt was made aware and advised to call the office before he runs out of medication so he could come in for an OV for more refills.  Pt understood.

## 2015-05-29 ENCOUNTER — Encounter: Payer: Self-pay | Admitting: Physician Assistant

## 2015-05-29 DIAGNOSIS — Z9889 Other specified postprocedural states: Secondary | ICD-10-CM | POA: Insufficient documentation

## 2015-06-05 ENCOUNTER — Encounter: Payer: Self-pay | Admitting: Physician Assistant

## 2015-06-05 ENCOUNTER — Ambulatory Visit (INDEPENDENT_AMBULATORY_CARE_PROVIDER_SITE_OTHER): Payer: Managed Care, Other (non HMO) | Admitting: Physician Assistant

## 2015-06-05 VITALS — BP 130/80 | HR 97 | Temp 98.4°F | Resp 16 | Ht 70.0 in | Wt 151.0 lb

## 2015-06-05 DIAGNOSIS — I10 Essential (primary) hypertension: Secondary | ICD-10-CM

## 2015-06-05 DIAGNOSIS — R6889 Other general symptoms and signs: Secondary | ICD-10-CM | POA: Diagnosis not present

## 2015-06-05 LAB — COMPLETE METABOLIC PANEL WITH GFR
ALBUMIN: 4.2 g/dL (ref 3.6–5.1)
ALK PHOS: 85 U/L (ref 40–115)
ALT: 13 U/L (ref 9–46)
AST: 22 U/L (ref 10–35)
BUN: 9 mg/dL (ref 7–25)
CALCIUM: 9.1 mg/dL (ref 8.6–10.3)
CO2: 27 mmol/L (ref 20–31)
CREATININE: 0.61 mg/dL — AB (ref 0.70–1.25)
Chloride: 106 mmol/L (ref 98–110)
GFR, Est Non African American: >89 mL/min (ref >=60)
Glucose, Bld: 94 mg/dL (ref 65–99)
POTASSIUM: 4.1 mmol/L (ref 3.5–5.3)
Sodium: 144 mmol/L (ref 135–146)
Total Bilirubin: 0.5 mg/dL (ref 0.2–1.2)
Total Protein: 6.9 g/dL (ref 6.1–8.1)

## 2015-06-05 LAB — TSH: TSH: 0.61 m[IU]/L (ref 0.40–4.50)

## 2015-06-05 MED ORDER — LISINOPRIL 20 MG PO TABS: 20.0000 mg | ORAL_TABLET | Freq: Every day | ORAL | Status: DC

## 2015-06-05 NOTE — Patient Instructions (Signed)
     IF you received an x-ray today, you will receive an invoice from [facility]. Please contact [facility] at [phone] with questions or concerns regarding your invoice.   IF you received labwork today, you will receive an invoice from [facility]. Please contact [facility] at [phone] with questions or concerns regarding your invoice.   Our billing staff will not be able to assist you with questions regarding bills from these companies.  You will be contacted with the lab results as soon as they are available. The fastest way to get your results is to activate your My Chart account. Instructions are located on the last page of this paperwork. If you have not heard from us regarding the results in 2 weeks, please contact this office.      

## 2015-06-05 NOTE — Progress Notes (Signed)
   Perry Robinson  MRN: [id number] DOB: 02-28-1951  Subjective:  Pt presents to clinic for medication refills. He has been doing ok.  He had neck surgery about a month ago and he is starting to feel some better but he is still weak and numb in his left arm.  He has noticed that he is cold a lot lately and wonders if that could be related to anything.  He is still in the study with Dr Jeffie Pollock for his prostate cancer.    Patient Active Problem List   Diagnosis Date Noted  . History of cervical discectomy 05/29/2015  . Diverticulosis of colon 06/09/2013  . Back pain with left-sided radiculopathy 04/13/2013  . Hypertension   . Prostate cancer ([place])     No current outpatient prescriptions on file prior to visit.   No current facility-administered medications on file prior to visit.    No Known Allergies  Review of Systems  Respiratory: Negative for cough and shortness of breath.   Cardiovascular: Negative for chest pain, palpitations and leg swelling.   Objective:  BP 130/80 mmHg  Pulse 97  Temp(Src) 98.4 F (36.9 C) (Oral)  Resp 16  Ht 5\' 10"  (1.778 m)  Wt 151 lb (68.493 kg)  BMI 21.67 kg/m2  SpO2 97%  Physical Exam  Constitutional: He is oriented to person, place, and time and well-developed, well-nourished, and in no distress.  HENT:  Head: Normocephalic and atraumatic.  Right Ear: External ear normal.  Left Ear: External ear normal.  Eyes: Conjunctivae are normal.  Neck: Normal range of motion.  Cardiovascular: Normal rate, regular rhythm, normal heart sounds and intact distal pulses.   Pulmonary/Chest: Effort normal and breath sounds normal. He has no wheezes.  Musculoskeletal:       Right lower leg: He exhibits no edema.       Left lower leg: He exhibits no edema.  Neurological: He is alert and oriented to person, place, and time. Gait normal.  Skin: Skin is warm and dry.  Psychiatric: Mood, memory, affect and judgment normal.    Assessment and Plan :    Essential hypertension - Plan: COMPLETE METABOLIC PANEL WITH GFR, lisinopril (PRINIVIL,ZESTRIL) 20 MG tablet, TSH, Care order/instruction - continue current medications - check labs  Cold intolerance - Plan: TSH  Windell Hummingbird PA-C  [facility] 06/05/2015 9:24 AM

## 2015-06-07 ENCOUNTER — Encounter (HOSPITAL_COMMUNITY)
Admission: RE | Admit: 2015-06-07 | Discharge: 2015-06-07 | Disposition: A | Discharge status: Home / Self Care | Payer: BLUE CROSS/BLUE SHIELD | Source: Ambulatory Visit | Attending: Urology | Admitting: Urology

## 2015-06-07 DIAGNOSIS — C61 Malignant neoplasm of prostate: Secondary | ICD-10-CM | POA: Insufficient documentation

## 2015-06-07 MED ORDER — TECHNETIUM TC 99M MEDRONATE IV KIT
25.0000 | PACK | Freq: Once | INTRAVENOUS | Status: AC | PRN
  Administered 2015-06-07: 27 via INTRAVENOUS

## 2015-06-11 ENCOUNTER — Encounter (HOSPITAL_COMMUNITY): Payer: BLUE CROSS/BLUE SHIELD

## 2015-06-11 ENCOUNTER — Encounter (HOSPITAL_COMMUNITY): Payer: Self-pay

## 2015-06-13 ENCOUNTER — Telehealth: Payer: Self-pay | Admitting: Internal Medicine

## 2015-06-13 NOTE — Telephone Encounter (Signed)
Pt scheduled to see Amy Esterwood PA 06/21/15@3pm . Lattie Haw with [facility] to fax records and notify pt of appt date and time.

## 2015-06-18 ENCOUNTER — Telehealth: Payer: Self-pay | Admitting: Physician Assistant

## 2015-06-18 NOTE — Telephone Encounter (Signed)
[facility] forward 16 pages to [place]

## 2015-06-21 ENCOUNTER — Other Ambulatory Visit (INDEPENDENT_AMBULATORY_CARE_PROVIDER_SITE_OTHER): Payer: Managed Care, Other (non HMO)

## 2015-06-21 ENCOUNTER — Encounter: Payer: Self-pay | Admitting: Physician Assistant

## 2015-06-21 ENCOUNTER — Ambulatory Visit (INDEPENDENT_AMBULATORY_CARE_PROVIDER_SITE_OTHER): Payer: Managed Care, Other (non HMO) | Admitting: Physician Assistant

## 2015-06-21 VITALS — BP 150/70 | HR 96 | Ht 68.75 in | Wt 152.4 lb

## 2015-06-21 DIAGNOSIS — K8689 Other specified diseases of pancreas: Secondary | ICD-10-CM

## 2015-06-21 DIAGNOSIS — K869 Disease of pancreas, unspecified: Secondary | ICD-10-CM

## 2015-06-21 LAB — COMPREHENSIVE METABOLIC PANEL
ALT: 11 U/L (ref 0–53)
AST: 17 U/L (ref 0–37)
Albumin: 4.2 g/dL (ref 3.5–5.2)
Alkaline Phosphatase: 88 U/L (ref 39–117)
BILIRUBIN TOTAL: 0.3 mg/dL (ref 0.2–1.2)
BUN: 14 mg/dL (ref 6–23)
CO2: 29 meq/L (ref 19–32)
CREATININE: 0.72 mg/dL (ref 0.40–1.50)
Calcium: 9.5 mg/dL (ref 8.4–10.5)
Chloride: 103 mEq/L (ref 96–112)
GFR: 140.18 mL/min (ref >60.00)
GLUCOSE: 94 mg/dL (ref 70–99)
Potassium: 4.1 mEq/L (ref 3.5–5.1)
Sodium: 138 mEq/L (ref 135–145)
Total Protein: 7.2 g/dL (ref 6.0–8.3)

## 2015-06-21 LAB — PROTIME-INR
INR: 1.1 ratio — ABNORMAL HIGH (ref 0.8–1.0)
Prothrombin Time: 11.7 s (ref 9.6–13.1)

## 2015-06-21 NOTE — Progress Notes (Addendum)
Patient ID: Perry Robinson, male   DOB: 10/12/1950, 65 y.o.   MRN: [id number]   Subjective:    Patient ID: Perry Robinson, male    DOB: 13-Jan-1951, 65 y.o.   MRN: [id number]  HPI  Perry Robinson is a pleasant 65 year old African-American male known to Dr.Pyrtle who is referred today by Dr. Jeffie Pollock [facility] after abnormal CT imaging of his pancreas.  Patient known to Dr.Pyrtle from prior colonoscopy done in March 2015 he had multiple polyps removed and left-sided diverticulosis. Path on the polyp showed 2 sessile serrated polyps and 2 tubular adenomas ,1 hyperplastic polyp. Patient has history of prostate cancer and has been in a study through [facility] and was having routine surveillance with CT on 06/07/2015. He had undergone prostatectomy in 2006. He was found to have a new 15 x 12 mm mass in the body of the pancreas with mild upstream duct dilation of the distal pancreatic duct is collapsed . This lesion is clear of the celiac trunk but on the sagittal projection there is questionable neural extension to the SMA, no retroperitoneal or portal adenopathy. Patient says he feels fine and had just returned to work after being out for 7 weeks after a cervical discectomy. He has no complaints of abdominal pain. His appetite has been good and his weight has been stable He is frustrated because he says "they keep doing x-rays and finding problems."   Review of Systems Pertinent positive and negative review of systems were noted in the above HPI section.  All other review of systems was otherwise negative.  Outpatient Encounter Prescriptions as of 06/21/2015  Medication Sig  . lisinopril (PRINIVIL,ZESTRIL) 20 MG tablet Take 1 tablet (20 mg total) by mouth daily.   No facility-administered encounter medications on file as of 06/21/2015.   No Known Allergies Patient Active Problem List   Diagnosis Date Noted  . History of cervical discectomy 05/29/2015  . Diverticulosis of colon 06/09/2013    . Back pain with left-sided radiculopathy 04/13/2013  . Hypertension   . Prostate cancer [facility])    Social History   Social History  . Marital Status: Married    Spouse Name: N/A  . Number of Children: N/A  . Years of Education: N/A   Occupational History  . Not on file.   Social History Main Topics  . Smoking status: Current Every Day Smoker -- 0.50 packs/day for 10 years    Types: Cigarettes    Last Attempt to Quit: 03/03/2010  . Smokeless tobacco: Never Used  . Alcohol Use: 16.8 oz/week    28 Cans of beer per week     Comment: "4 beers a day" per patient.   . Drug Use: No  . Sexual Activity: Yes   Other Topics Concern  . Not on file   Social History Narrative   Married. Education; high school. Exercise: twice weekly    Mr. Trnka's family history includes Thyroid disease in his mother. There is no history of Colon cancer.      Objective:    Filed Vitals:   06/21/15 1507  BP: 150/70  Pulse: 96    Physical Exam  well-developed older African-American male in no acute distress, pleasant blood pressure 150/70 pulse 6 height 5 foot 8 weight 152. HEENT; nontraumatic normocephalic EOMI PERRLA sclera anicteric, Cardiovascular; regular rate and rhythm with S1-S2 pulmonary clear bilaterally, Abdomen ;soft minimally tender with deep palpation in the epigastrium there is no guarding or rebound no palpable mass or hepatosplenomegaly  bowel sounds are present, Rectal ;exam not done, Extremities; no clubbing cyanosis or edema skin warm and dry, Neuropsych; mood and affect appropriate     Assessment & Plan:   #1 65 yo male With new finding of a small mass in the body of the pancreas concerning for pancreatic adenocarcinoma, possible involvement of the SMA. Patient is asymptomatic. #2 history of prostate cancer status post prostatectomy 2006 #3 history of adenomatous and sessile serrated polyps-last colonoscopy March 2015 #4 hypertension #5 recent cervical discectomy 7-8  weeks ago  Plan; discussed with Dr. Ardis Hughs and have scheduled for EUS with biopsy on Thursday, 06/28/2015. Procedure discussed in detail with patient including risks and benefits and he is agreeable to proceed. We'll check CMET, PT/INR and CA-19-9 today   Amy S Esterwood PA-C 06/21/2015   Cc: Weber, Damaris Hippo, PA-C  Addendum: Reviewed and agree with initial management. Jerene Bears, MD

## 2015-06-21 NOTE — Patient Instructions (Signed)
Please go to the basement level to have your labs drawn.  You have been scheduled for an endoscopic Ultrasound.. Please follow written instructions given to you at your visit today. If you use inhalers (even only as needed), please bring them with you on the day of your procedure.

## 2015-06-22 LAB — CANCER ANTIGEN 19-9: CA 19-9: 94 U/mL — ABNORMAL HIGH (ref <34)

## 2015-06-22 NOTE — Progress Notes (Signed)
i agree with the above note, plan 

## 2015-06-26 ENCOUNTER — Encounter (HOSPITAL_COMMUNITY): Payer: Self-pay | Admitting: *Deleted

## 2015-06-28 ENCOUNTER — Encounter (HOSPITAL_COMMUNITY): Payer: Self-pay | Admitting: Gastroenterology

## 2015-06-28 ENCOUNTER — Telehealth: Payer: Self-pay

## 2015-06-28 ENCOUNTER — Ambulatory Visit (HOSPITAL_COMMUNITY)
Admission: RE | Admit: 2015-06-28 | Discharge: 2015-06-28 | Disposition: A | Discharge status: Home / Self Care | Payer: Managed Care, Other (non HMO) | Source: Ambulatory Visit | Attending: Gastroenterology | Admitting: Gastroenterology

## 2015-06-28 ENCOUNTER — Ambulatory Visit (HOSPITAL_COMMUNITY): Payer: Managed Care, Other (non HMO) | Admitting: Certified Registered Nurse Anesthetist

## 2015-06-28 ENCOUNTER — Encounter (HOSPITAL_COMMUNITY)
Admission: RE | Disposition: A | Discharge status: Home / Self Care | Payer: Self-pay | Source: Ambulatory Visit | Attending: Gastroenterology

## 2015-06-28 DIAGNOSIS — F17210 Nicotine dependence, cigarettes, uncomplicated: Secondary | ICD-10-CM | POA: Insufficient documentation

## 2015-06-28 DIAGNOSIS — I10 Essential (primary) hypertension: Secondary | ICD-10-CM | POA: Diagnosis not present

## 2015-06-28 DIAGNOSIS — Z8546 Personal history of malignant neoplasm of prostate: Secondary | ICD-10-CM | POA: Diagnosis not present

## 2015-06-28 DIAGNOSIS — C251 Malignant neoplasm of body of pancreas: Secondary | ICD-10-CM | POA: Diagnosis not present

## 2015-06-28 DIAGNOSIS — Z86010 Personal history of colonic polyps: Secondary | ICD-10-CM | POA: Insufficient documentation

## 2015-06-28 DIAGNOSIS — R933 Abnormal findings on diagnostic imaging of other parts of digestive tract: Secondary | ICD-10-CM

## 2015-06-28 DIAGNOSIS — K869 Disease of pancreas, unspecified: Secondary | ICD-10-CM

## 2015-06-28 DIAGNOSIS — K8689 Other specified diseases of pancreas: Secondary | ICD-10-CM

## 2015-06-28 HISTORY — PX: EUS: SHX5427

## 2015-06-28 HISTORY — PX: FINE NEEDLE ASPIRATION: SHX6590

## 2015-06-28 SURGERY — FINE NEEDLE ASPIRATION
Anesthesia: Monitor Anesthesia Care

## 2015-06-28 MED ORDER — PROPOFOL 10 MG/ML IV BOLUS
INTRAVENOUS | Status: AC
  Filled 2015-06-28: qty 20

## 2015-06-28 MED ORDER — ONDANSETRON HCL 4 MG/2ML IJ SOLN
INTRAMUSCULAR | Status: AC
  Filled 2015-06-28: qty 2

## 2015-06-28 MED ORDER — PROPOFOL 10 MG/ML IV BOLUS
INTRAVENOUS | Status: DC | PRN
  Administered 2015-06-28 (×5): 20 mg via INTRAVENOUS

## 2015-06-28 MED ORDER — LIDOCAINE HCL (CARDIAC) 20 MG/ML IV SOLN
INTRAVENOUS | Status: AC
  Filled 2015-06-28: qty 5

## 2015-06-28 MED ORDER — LACTATED RINGERS IV SOLN
INTRAVENOUS | Status: DC
  Administered 2015-06-28: 1000 mL via INTRAVENOUS

## 2015-06-28 MED ORDER — ONDANSETRON HCL 4 MG/2ML IJ SOLN
INTRAMUSCULAR | Status: DC | PRN
  Administered 2015-06-28: 4 mg via INTRAVENOUS

## 2015-06-28 MED ORDER — PROPOFOL 500 MG/50ML IV EMUL
INTRAVENOUS | Status: DC | PRN
  Administered 2015-06-28: 150 ug/kg/min via INTRAVENOUS

## 2015-06-28 MED ORDER — SODIUM CHLORIDE 0.9 % IV SOLN: INTRAVENOUS | Status: DC

## 2015-06-28 MED ORDER — PROPOFOL 10 MG/ML IV BOLUS
INTRAVENOUS | Status: AC
  Filled 2015-06-28: qty 40

## 2015-06-28 NOTE — Transfer of Care (Signed)
  Immediate Anesthesia Transfer of Care Note  Patient: Perry Robinson  Procedure(s) Performed: Procedure(s) with comments: UPPER ENDOSCOPIC ULTRASOUND (EUS) LINEAR (N/A) - spoke with patient about new check in/procedure times [place]  Patient Location: PACU  Anesthesia Type:MAC  Level of Consciousness:  sedated, patient cooperative and responds to stimulation  Airway & Oxygen Therapy:Patient Spontanous Breathing and Patient connected to face mask oxgen  Post-op Assessment:  Report given to PACU RN and Post -op Vital signs reviewed and stable  Post vital signs:  Reviewed and stable  Last Vitals:  Filed Vitals:   06/28/15 0954 06/28/15 1205  BP: 155/89 142/65  Pulse: 89 84  Temp: 36.7 C   Resp: 10 18    Complications: No apparent anesthesia complications

## 2015-06-28 NOTE — Telephone Encounter (Signed)
-----   Message from [name], MD sent at 06/28/2015 12:03 PM EDT ----- Roselle Locus, Dr. Jeffie Pollock, Just completed EUS/FNA of the pancreatic mass. See full report in EPIC.  Looks like adenocarcinoma, awaiting final pathology review.  I'll set up referrals to medical/surgical oncology.   Dalana Pfahler, He needs referrals to medical oncology (feng or sherrill), also needs CT scan chest with IV constrast (staging for newly diagnosed body of pancreas adenocarcinoma).    Manuela Schwartz, Can you put him on for next GI cancer conference.  Feara, See above.  This is 1.7cm, in body, very healthy man. CT scan looks like SMA MAY be involved but the SMA looks pretty clear on my exam today.    Thanks All

## 2015-06-28 NOTE — Interval H&P Note (Signed)
History and Physical Interval Note:  06/28/2015 9:47 AM  Perry Robinson  has presented today for surgery, with the diagnosis of pancreatic mass   The various methods of treatment have been discussed with the patient and family. After consideration of risks, benefits and other options for treatment, the patient has consented to  Procedure(s) with comments: UPPER ENDOSCOPIC ULTRASOUND (EUS) LINEAR (N/A) - spoke with patient about new check in/procedure times JB as a surgical intervention .  The patient's history has been reviewed, patient examined, no change in status, stable for surgery.  I have reviewed the patient's chart and labs.  Questions were answered to the patient's satisfaction.     [name]

## 2015-06-28 NOTE — Anesthesia Postprocedure Evaluation (Signed)
Anesthesia Post Note  Patient: Perry Robinson  Procedure(s) Performed: Procedure(s) (LRB): UPPER ENDOSCOPIC ULTRASOUND (EUS) LINEAR (N/A) FINE NEEDLE ASPIRATION  Patient location during evaluation: PACU Anesthesia Type: MAC Level of consciousness: awake and alert Pain management: pain level controlled Vital Signs Assessment: post-procedure vital signs reviewed and stable Respiratory status: spontaneous breathing, nonlabored ventilation, respiratory function stable and patient connected to nasal cannula oxygen Cardiovascular status: blood pressure returned to baseline and stable Postop Assessment: no signs of nausea or vomiting Anesthetic complications: no    Last Vitals:  Filed Vitals:   06/28/15 1210 06/28/15 1220  BP: 146/86 162/80  Pulse: 81 83  Temp:    Resp: 22 21    Last Pain: There were no vitals filed for this visit.               Zephyra Bernardi S

## 2015-06-28 NOTE — Anesthesia Procedure Notes (Signed)
Procedure Name: MAC Date/Time: 06/28/2015 11:08 AM Performed by: West Pugh Pre-anesthesia Checklist: Patient identified, Timeout performed, Emergency Drugs available, Suction available and Patient being monitored Patient Re-evaluated:Patient Re-evaluated prior to inductionOxygen Delivery Method: Nasal cannula Dental Injury: Teeth and Oropharynx as per pre-operative assessment

## 2015-06-28 NOTE — Anesthesia Preprocedure Evaluation (Signed)
Anesthesia Evaluation  Patient identified by MRN, date of birth, ID band Patient awake    Reviewed: Allergy & Precautions, NPO status , Patient's Chart, lab work & pertinent test results  Airway Mallampati: II  TM Distance: >3 FB Neck ROM: Full    Dental no notable dental hx.    Pulmonary neg pulmonary ROS, Current Smoker,    Pulmonary exam normal breath sounds clear to auscultation       Cardiovascular hypertension, Pt. on medications Normal cardiovascular exam Rhythm:Regular Rate:Normal     Neuro/Psych negative neurological ROS  negative psych ROS   GI/Hepatic negative GI ROS, Neg liver ROS,   Endo/Other  negative endocrine ROS  Renal/GU negative Renal ROS  negative genitourinary   Musculoskeletal negative musculoskeletal ROS (+)   Abdominal   Peds negative pediatric ROS (+)  Hematology negative hematology ROS (+)   Anesthesia Other Findings   Reproductive/Obstetrics negative OB ROS                             Anesthesia Physical Anesthesia Plan  ASA: II  Anesthesia Plan: MAC   Post-op Pain Management:    Induction: Intravenous  Airway Management Planned: Nasal Cannula  Additional Equipment:   Intra-op Plan:   Post-operative Plan:   Informed Consent: I have reviewed the patients History and Physical, chart, labs and discussed the procedure including the risks, benefits and alternatives for the proposed anesthesia with the patient or authorized representative who has indicated his/her understanding and acceptance.   Dental advisory given  Plan Discussed with: CRNA and Surgeon  Anesthesia Plan Comments:         Anesthesia Quick Evaluation

## 2015-06-28 NOTE — H&P (View-Only) (Signed)
Patient ID: Perry Robinson, male   DOB: 28-Nov-1950, 65 y.o.   MRN: [id number]   Subjective:    Patient ID: Perry Robinson, male    DOB: [birth year], 66 y.o.   MRN: [id number]  HPI  Perry Robinson is a pleasant 65 year old African-American male known to Dr.Pyrtle who is referred today by Dr. Jeffie Pollock [facility] after abnormal CT imaging of his pancreas.  Patient known to Dr.Pyrtle from prior colonoscopy done in March 2015 he had multiple polyps removed and left-sided diverticulosis. Path on the polyp showed 2 sessile serrated polyps and 2 tubular adenomas ,1 hyperplastic polyp. Patient has history of prostate cancer and has been in a study through [facility] and was having routine surveillance with CT on 06/07/2015. He had undergone prostatectomy in 2006. He was found to have a new 15 x 12 mm mass in the body of the pancreas with mild upstream duct dilation of the distal pancreatic duct is collapsed . This lesion is clear of the celiac trunk but on the sagittal projection there is questionable neural extension to the SMA, no retroperitoneal or portal adenopathy. Patient says he feels fine and had just returned to work after being out for 7 weeks after a cervical discectomy. He has no complaints of abdominal pain. His appetite has been good and his weight has been stable He is frustrated because he says "they keep doing x-rays and finding problems."   Review of Systems Pertinent positive and negative review of systems were noted in the above HPI section.  All other review of systems was otherwise negative.  Outpatient Encounter Prescriptions as of 06/21/2015  Medication Sig  . lisinopril (PRINIVIL,ZESTRIL) 20 MG tablet Take 1 tablet (20 mg total) by mouth daily.   No facility-administered encounter medications on file as of 06/21/2015.   No Known Allergies Patient Active Problem List   Diagnosis Date Noted  . History of cervical discectomy 05/29/2015  . Diverticulosis of colon 06/09/2013    . Back pain with left-sided radiculopathy 04/13/2013  . Hypertension   . Prostate cancer [facility])    Social History   Social History  . Marital Status: Married    Spouse Name: N/A  . Number of Children: N/A  . Years of Education: N/A   Occupational History  . Not on file.   Social History Main Topics  . Smoking status: Current Every Day Smoker -- 0.50 packs/day for 10 years    Types: Cigarettes    Last Attempt to Quit: 03/03/2010  . Smokeless tobacco: Never Used  . Alcohol Use: 16.8 oz/week    28 Cans of beer per week     Comment: "4 beers a day" per patient.   . Drug Use: No  . Sexual Activity: Yes   Other Topics Concern  . Not on file   Social History Narrative   Married. Education; high school. Exercise: twice weekly    Mr. Perry Robinson's family history includes Thyroid disease in his mother. There is no history of Colon cancer.      Objective:    Filed Vitals:   06/21/15 1507  BP: 150/70  Pulse: 96    Physical Exam  well-developed older African-American male in no acute distress, pleasant blood pressure 150/70 pulse 6 height 5 foot 8 weight 152. HEENT; nontraumatic normocephalic EOMI PERRLA sclera anicteric, Cardiovascular; regular rate and rhythm with S1-S2 pulmonary clear bilaterally, Abdomen ;soft minimally tender with deep palpation in the epigastrium there is no guarding or rebound no palpable mass or hepatosplenomegaly  bowel sounds are present, Rectal ;exam not done, Extremities; no clubbing cyanosis or edema skin warm and dry, Neuropsych; mood and affect appropriate     Assessment & Plan:   #1 65 yo male With new finding of a small mass in the body of the pancreas concerning for pancreatic adenocarcinoma, possible involvement of the SMA. Patient is asymptomatic. #2 history of prostate cancer status post prostatectomy 2006 #3 history of adenomatous and sessile serrated polyps-last colonoscopy March 2015 #4 hypertension #5 recent cervical discectomy 7-8  weeks ago  Plan; discussed with Dr. Ardis Hughs and have scheduled for EUS with biopsy on Thursday, 06/28/2015. Procedure discussed in detail with patient including risks and benefits and he is agreeable to proceed. We'll check CMET, PT/INR and CA-19-9 today   Amy S Esterwood PA-C 06/21/2015   Cc: Weber, Damaris Hippo, PA-C  Addendum: Reviewed and agree with initial management. Jerene Bears, MD

## 2015-06-28 NOTE — Discharge Instructions (Signed)
YOU HAD AN ENDOSCOPIC PROCEDURE TODAY: Refer to the procedure report that was given to you for any specific questions about what was found during the examination.  If the procedure report does not answer your questions, please call your gastroenterologist to clarify. ° °YOU SHOULD EXPECT: Some feelings of bloating in the abdomen. Passage of more gas than usual.  Walking can help get rid of the air that was put into your GI tract during the procedure and reduce the bloating. If you had a lower endoscopy (such as a colonoscopy or flexible sigmoidoscopy) you may notice spotting of blood in your stool or on the toilet paper.  ° °DIET: Your first meal following the procedure should be a light meal and then it is ok to progress to your normal diet.  A half-sandwich or bowl of soup is an example of a good first meal.  Heavy or fried foods are harder to digest and may make you feel nasueas or bloated.  Drink plenty of fluids but you should avoid alcoholic beverages for 24 hours. ° °ACTIVITY: Your care partner should take you home directly after the procedure.  You should plan to take it easy, moving slowly for the rest of the day.  You can resume normal activity the day after the procedure however you should NOT DRIVE or use heavy machinery for 24 hours (because of the sedation medicines used during the test).   ° °SYMPTOMS TO REPORT IMMEDIATELY  °A gastroenterologist can be reached at any hour.  Please call your doctor's office for any of the following symptoms: ° °· Following upper endoscopy (EGD, EUS, ERCP) ° Vomiting of blood or coffee ground material ° New, significant abdominal pain ° New, significant chest pain or pain under the shoulder blades ° Painful or persistently difficult swallowing ° New shortness of breath ° Black, tarry-looking stools ° °FOLLOW UP: °If any biopsies were taken you will be contacted by phone or by letter within the next 1-3 weeks.  Call your gastroenterologist if you have not heard about the  biopsies in 3 weeks.  °Please also call your gastroenterologist's office with any specific questions about appointments or follow up tests. ° °

## 2015-06-28 NOTE — Op Note (Signed)
[facility] Patient Name: Perry Robinson Procedure Date: 06/28/2015 MRN: [id number] Attending MD: [name] , MD Date of Birth: 1950-08-28 CSN:  Age: 65 Admit Type: Outpatient Procedure:                Upper EUS Indications:              Suspected mass in pancreas on CT scan Providers:                [name], MD, Cleda Daub, RN, Cherylynn Ridges, Technician, Ralene Bathe, Technician Referring MD:             Shirlyn Goltz, MD; Zenovia Jarred, MD Medicines:                Monitored Anesthesia Care Complications:            No immediate complications. Estimated blood loss:                            None. Estimated Blood Loss:     Estimated blood loss: none. Procedure:                Pre-Anesthesia Assessment:                           - Prior to the procedure, a History and Physical                            was performed, and patient medications and                            allergies were reviewed. The patient's tolerance of                            previous anesthesia was also reviewed. The risks                            and benefits of the procedure and the sedation                            options and risks were discussed with the patient.                            All questions were answered, and informed consent                            was obtained. Prior Anticoagulants: The patient has                            taken no previous anticoagulant or antiplatelet                            agents. ASA Grade Assessment: II - A patient with  mild systemic disease. After reviewing the risks                            and benefits, the patient was deemed in                            satisfactory condition to undergo the procedure.                           After obtaining informed consent, the endoscope was                            passed under direct vision. Throughout the          procedure, the patient's blood pressure, pulse, and                            oxygen saturations were monitored continuously. The                            [id number]) scope was introduced through                            the mouth, and advanced to the second part of                            duodenum. The upper EUS was accomplished without                            difficulty. The patient tolerated the procedure                            well. Scope In: Scope Out: Findings:      Endoscopic Finding :      The examined esophagus was endoscopically normal.      The entire examined stomach was endoscopically normal.      The examined duodenum was endoscopically normal.      Endosonographic Finding :      1. An irregular mass was identified in the pancreatic body. The mass was       hypoechoic. The mass measured 17 mm in maximal cross-sectional diameter       and caused clear upstream dilation of the main pancreatic duct. The       endosonographic borders were well-defined and there was no clear sign of       involvement of SMA, SMV, celiac trunk or portal vein. The remainder of       the pancreas was examined. Fine needle aspiration for cytology was       performed. Color Doppler imaging was utilized prior to needle puncture       to confirm a lack of significant vascular structures within the needle       path. Two passes were made with the 25 gauge needle using a transgastric       approach. Some passes were made with a stylet. A cytotechnologist was       present to evaluate the adequacy of the specimen.      2. The  main pancreatic duct was dilated up to 89mm, upstream from the       mass described above.      3. The CBD was normal, non-dilated.      4. No peripancreatic adenopathy.      5. Gallbladder was normal.      6. Limited views of liver, spleen, portal vessels were all normal Impression:               - A mass was identified in the pancreatic body,                             causing upstream main pancreatic duct dilation.                            There was no clear involvement with SMA, SMV,                            portal vein, celiac trunk. Fine needle aspiration                            performed throught the posterior gastric wall and                            preliminary cytology is positive for malignancy                            (likely adenocarcinoma). Current clinical Stage Ia. Moderate Sedation:      N/A- Per Anesthesia Care Recommendation:           - Patient has a contact number available for                            emergencies. The signs and symptoms of potential                            delayed complications were discussed with the                            patient. Return to normal activities tomorrow.                            Written discharge instructions were provided to the                            patient.                           - Discharge patient to home (ambulatory).                           - Dr. Ardis Hughs' office will arrange referrals to                            surgical oncology ([name]), medical oncology.  Will also set up staging CT scan of chest. Dr.                            Ardis Hughs will contact you when final pathology                            results are available.                           - Will plan to present this case at next week GI                            Tumor Board, multidisciplinary conference. Procedure Code(s):        --- Professional ---                           [phone], Esophagogastroduodenoscopy, flexible,                            transoral; with transendoscopic ultrasound-guided                            intramural or transmural fine needle                            aspiration/biopsy(s), (includes endoscopic                            ultrasound examination limited to the esophagus,                            stomach or duodenum,  and adjacent structures) Diagnosis Code(s):        --- Professional ---                           K86.89, Other specified diseases of pancreas                           R93.3, Abnormal findings on diagnostic imaging of                            other parts of digestive tract CPT copyright 2016 [employer] Association. All rights reserved. The codes documented in this report are preliminary and upon coder review may  be revised to meet current compliance requirements. [name], MD 06/28/2015 12:02:32 PM This report has been signed electronically. Number of Addenda: 0

## 2015-06-29 ENCOUNTER — Telehealth: Payer: Self-pay

## 2015-06-29 DIAGNOSIS — C259 Malignant neoplasm of pancreas, unspecified: Secondary | ICD-10-CM

## 2015-06-29 NOTE — Telephone Encounter (Signed)
-----   Message from Tania Ade, RN sent at 06/29/2015  2:53 PM EDT ----- Regarding: CT report I sent a request for a copy of the CT report on Perry Robinson and they will not send it without a signed release. Does your office have a copy? Manuela Schwartz ----- Message -----    From: [name], MD    Sent: 06/28/2015  12:03 PM      To: Amy Genia Harold, PA-C, Stark Klein, MD, #  Amy, Ulice Dash, Dr. Jeffie Pollock, Just completed EUS/FNA of the pancreatic mass. See full report in EPIC.  Looks like adenocarcinoma, awaiting final pathology review.  I'll set up referrals to medical/surgical oncology.   Rynn Markiewicz, He needs referrals to medical oncology (feng or [facility]), also needs CT scan chest with IV constrast (staging for newly diagnosed body of pancreas adenocarcinoma).    Manuela Schwartz, Can you put him on for next GI cancer conference.  [place], See above.  This is 1.7cm, in body, very healthy man. CT scan looks like SMA MAY be involved but the SMA looks pretty clear on my exam today.    Thanks All

## 2015-06-30 ENCOUNTER — Encounter (HOSPITAL_COMMUNITY): Payer: Self-pay | Admitting: Gastroenterology

## 2015-07-02 ENCOUNTER — Telehealth: Payer: Self-pay | Admitting: *Deleted

## 2015-07-02 NOTE — Telephone Encounter (Signed)
Left message on machine to call back       You have been scheduled for a CT scan of the chest at [facility] ([address]---this is in the same building as Press [facility]).   You are scheduled on 07/04/15 at 9 am. You should arrive 15 minutes prior to your appointment time for registration. Please follow the written instructions below on the day of your exam:  WARNING: IF YOU ARE ALLERGIC TO IODINE/X-RAY DYE, PLEASE NOTIFY RADIOLOGY IMMEDIATELY AT [phone]! YOU WILL BE GIVEN A 13 HOUR PREMEDICATION PREP.  1) Do not eat or drink anything after 7 am (2 hours prior to your test)  You may take any medications as prescribed with a small amount of water except for the following: Metformin, Glucophage, Glucovance, Avandamet, Riomet, Fortamet, Actoplus Met, Janumet, Glumetza or Metaglip. The above medications must be held the day of the exam AND 48 hours after the exam.  The purpose of you drinking the oral contrast is to aid in the visualization of your intestinal tract. The contrast solution may cause some diarrhea. Before your exam is started, you will be given a small amount of fluid to drink. Depending on your individual set of symptoms, you may also receive an intravenous injection of x-ray contrast/dye. Plan on being at [facility] for 30 minutes or longer, depending on the type of exam you are having performed.  This test typically takes 30-45 minutes to complete.  If you have any questions regarding your exam or if you need to reschedule, you may call the CT department at [phone] between the hours of 8:00 am and 5:00 pm, Monday-Friday.  ________________________________________________________________________

## 2015-07-02 NOTE — Telephone Encounter (Signed)
Oncology Nurse Navigator Documentation  Oncology Nurse Navigator Flowsheets 07/02/2015  Navigator Location [facility]  Navigator Encounter Type Introductory phone call  Confirmed Diagnosis Date 06/29/2015  Left VM for patient to call nurse navigator for his new patient appointment with Dr. Benay Spice. Left navigator's direct office number to return call. Attempted to get CT report of abd/pelvis from [facility], but they would not do so without a signed release from patient.

## 2015-07-03 ENCOUNTER — Telehealth: Payer: Self-pay | Admitting: *Deleted

## 2015-07-03 NOTE — Telephone Encounter (Signed)
The patient has been notified of this information and all questions answered.

## 2015-07-03 NOTE — Telephone Encounter (Signed)
Patient returned call to confirm his appointment with Dr. Benay Spice on 07/12/15 at 2pm. Made aware of location of [place] and to bring insurance information and copy of medications.

## 2015-07-03 NOTE — Telephone Encounter (Signed)
  Oncology Nurse Navigator Documentation  Navigator Location: [facility] (07/03/15 [id number]) Navigator Encounter Type: Introductory phone call (07/03/15 [id number])  Left VM with new patient appointment for 07/12/15 at 2 pm with Dr. Benay Spice. Requested return call to confirm.

## 2015-07-04 ENCOUNTER — Ambulatory Visit (INDEPENDENT_AMBULATORY_CARE_PROVIDER_SITE_OTHER)
Admission: RE | Admit: 2015-07-04 | Discharge: 2015-07-04 | Disposition: A | Discharge status: Home / Self Care | Payer: Managed Care, Other (non HMO) | Source: Ambulatory Visit | Attending: Gastroenterology | Admitting: Gastroenterology

## 2015-07-04 DIAGNOSIS — C259 Malignant neoplasm of pancreas, unspecified: Secondary | ICD-10-CM | POA: Diagnosis not present

## 2015-07-04 MED ORDER — IOPAMIDOL (ISOVUE-300) INJECTION 61%
80.0000 mL | Freq: Once | INTRAVENOUS | Status: AC | PRN
  Administered 2015-07-04: 80 mL via INTRAVENOUS

## 2015-07-10 ENCOUNTER — Telehealth: Payer: Self-pay | Admitting: Gastroenterology

## 2015-07-10 NOTE — Telephone Encounter (Signed)
See result note.  

## 2015-07-11 ENCOUNTER — Encounter: Payer: Self-pay | Admitting: *Deleted

## 2015-07-12 ENCOUNTER — Encounter: Payer: Self-pay | Admitting: *Deleted

## 2015-07-12 ENCOUNTER — Ambulatory Visit (HOSPITAL_BASED_OUTPATIENT_CLINIC_OR_DEPARTMENT_OTHER): Payer: Managed Care, Other (non HMO) | Admitting: Oncology

## 2015-07-12 ENCOUNTER — Telehealth: Payer: Self-pay | Admitting: Oncology

## 2015-07-12 VITALS — BP 148/65 | HR 94 | Temp 98.2°F | Resp 17 | Ht 68.75 in | Wt 153.9 lb

## 2015-07-12 DIAGNOSIS — I10 Essential (primary) hypertension: Secondary | ICD-10-CM | POA: Diagnosis not present

## 2015-07-12 DIAGNOSIS — J449 Chronic obstructive pulmonary disease, unspecified: Secondary | ICD-10-CM | POA: Diagnosis not present

## 2015-07-12 DIAGNOSIS — C251 Malignant neoplasm of body of pancreas: Secondary | ICD-10-CM | POA: Diagnosis not present

## 2015-07-12 DIAGNOSIS — Z720 Tobacco use: Secondary | ICD-10-CM | POA: Diagnosis not present

## 2015-07-12 DIAGNOSIS — Z8546 Personal history of malignant neoplasm of prostate: Secondary | ICD-10-CM

## 2015-07-12 DIAGNOSIS — Z8042 Family history of malignant neoplasm of prostate: Secondary | ICD-10-CM

## 2015-07-12 NOTE — Patient Instructions (Signed)
     Care Plan Summary- 07/12/2015 Name: Perry Robinson. Tracht    DOB: 05/19/1950   Your Medical Team: Medical Oncologist:  Dr. Ma Rings Radiation Oncologist:    Surgeon:    Dr. Stark Klein Type of Cancer: Adenocarcinoma Pancreas (Body)  Stage/Grade: Stage I *Exact staging of your cancer is based on size of the tumor, depth of invasion, involvement of lymph nodes or not, and whether or not the cancer has spread beyond the primary site.   Recommendations: Based on information available as of today's consult. Recommendations may change depending on the results of further tests or exams. 1) Surgery is the 1st option to obtain cure 2) Follow surgery with adjuvant chemotherapy (chemo after surgery)-Xeloda (pill) and Gemzar (IV) 3)  Next Steps: 1) Referral to [name] at [place] office will call  2) Return to Dr. Benay Spice after surgery 3)  Questions? Merceda Elks, RN, BSN at [phone]. Perry Robinson is your Oncology Nurse Navigator and is available to assist you while you're receiving your medical care at [place] [facility]

## 2015-07-12 NOTE — Telephone Encounter (Signed)
left msg confirming june apt

## 2015-07-12 NOTE — Progress Notes (Signed)
Oncology Nurse Navigator Documentation  Oncology Nurse Navigator Flowsheets 07/12/2015  Navigator Location [facility]  Navigator Encounter Type Initial MedOnc  Abnormal Finding Date 06/07/2015  Confirmed Diagnosis Date -  Patient Visit Type MedOnc;Initial  Treatment Phase Pre-Tx/Tx Discussion---surgery is 1st step,followed by adjuvant Gemzar/Xeloda  Barriers/Navigation Needs Education;Coordination of Care  Education Accessing Care/ Finding Providers;Understanding Cancer/ Treatment Options;Coping with Diagnosis/ Prognosis;Preparing for Upcoming Surgery/ Treatment;Newly Diagnosed Cancer Education  Interventions Coordination of Care;Education Method  Coordination of Care Appts--Dr. Byerly--entered referral and sent message to Dr. Marlowe Aschoff nurse  Education Method Verbal;Written;Teach-back;Demonstration--showed on wall chart anatomy of pancreas   Support Groups/Services GI Support Group;[place]  Acuity Level 1  Time Spent with Patient 60  Met with patient during new patient visit. Explained the role of the GI Nurse Navigator and provided New Patient Packet with information on: 1. Pancreas cancer--info on CA 19-9 marker, nutrition 2. Support groups 3. Advanced Directives 4. Fall Safety Plan Answered questions, reviewed current treatment plan using TEACH back and provided emotional support. Provided copy of current treatment plan. Patient was very restless and animated during entire visit; and voice is very strong. He is confused and frustrated w/all the doctor appointments and why he is seeing so many. Tells RN "my wife told me to stop being so angry". He is married to wife, Perry Robinson. He has no children. Has returned to work "light duty" due to cervical disc surgery 04/2015. Informed him he will be out of work for possibly a few months after the surgery.  Did seem to calm down towards the end. Explained the role of each physician on his treatment team. Stressed that his cancer was  found very early and he has an excellent prognosis. He was very anxious to leave at end of the appointment, so he left without scheduling his follow up visit. Nurse navigator will follow for surgery date and get him in post op.   Merceda Elks, RN, BSN GI Oncology [place]

## 2015-07-12 NOTE — Progress Notes (Signed)
[facility] New Patient Consult   Referring MD: Mancel Bale, Pa-c [place],  [id number]   Perry Robinson 65 y.o.  10-29-1950    Reason for Referral: Pancreas cancer   HPI: Perry Robinson is followed by Dr. Ernesta Amble for prostate cancer. He is enrolled in a clinical trial and had a routine surveillance CT on 06/07/2015. This revealed a mass in the body of the pancreas. He was referred to Dr. Ardis Hughs and underwent an endoscopic ultrasound 06/28/2015. The duodenum appeared normal. An irregular mass was identified in the body of the pancreas measuring 17 mm. No evidence of vascular invasion. A fine-needle aspiration biopsy was performed. No peripancreatic adenopathy. The cytology [id number]) revealed adenocarcinoma.  Perry Robinson reports feeling well.  Past Medical History  Diagnosis Date  . Hypertension   . GERD (gastroesophageal reflux disease)   . Prostate cancer ([place]) 07/11/2004  . Cataract     .  Right inguinal hernia  Past Surgical History  Procedure Laterality Date  . Prostatectomy  08/2004    2nd to CA  . Cataract extraction  2011  . Eye surgery    . Rotator cuff repair  10/2008  . Back surgery  04/30/15    cervical fusion  . Eus N/A 06/28/2015    Procedure: UPPER ENDOSCOPIC ULTRASOUND (EUS) LINEAR;  Surgeon: [name], MD;  Location: [facility];  Service: Endoscopy;  Laterality: N/A;  spoke with patient about new check in/procedure times JB  . Fine needle aspiration  06/28/2015    Procedure: FINE NEEDLE ASPIRATION;  Surgeon: [name], MD;  Location: [facility];  Service: Endoscopy;;    Medications: Reviewed  Allergies: No Known Allergies  Family history: His father had prostate cancer. No other family history of cancer. No children.  Social History:   He lives with his wife in [place]. He works as an Biomedical scientist. He smokes cigarettes. He reports drinking approximately 2 beers per day and  has used a call more heavily in the past. No transfusion history. No risk factor for HIV or hepatitis.  History  Alcohol Use  . 16.8 oz/week  . 28 Cans of beer per week    Comment: "4 beers a day" per patient.     History  Smoking status  . Current Every Day Smoker -- 0.50 packs/day for 10 years  . Types: Cigarettes  Smokeless tobacco  . Never Used      ROS:   Positives include:Discomfort at the right groin, persistent tingling in the left hand following cervical spine surgery  A complete ROS was otherwise negative.  Physical Exam:  Blood pressure 148/65, pulse 94, temperature 98.2 F (36.8 C), temperature source Oral, resp. rate 17, height 5' 8.75" (1.746 m), weight 153 lb 14.4 oz (69.809 kg), SpO2 100 %.  HEENT: Oropharynx without visible mass, neck without mass Lungs: Clear bilaterally Cardiac: Regular rate and rhythm Abdomen: No hepatomegaly, no mass, nontender, no splenomegaly, slight soft fullness in the right inguinal area GU: Uncircumcised male, the left testicle is enlarged and without a discrete mass. Right testicle without mass  Vascular: No leg edema Lymph nodes: No cervical, supraclavicular, axillary, or inguinal nodes Neurologic: Alert and oriented, the motor exam appears intact in the upper and lower extremities Skin: No rash Musculoskeletal: No spine tenderness   LAB:  CBC  Lab Results  Component Value Date   WBC 3.3* 03/23/2013   HGB 15.2 03/23/2013   HCT 43.0 03/23/2013   MCV  90.5 03/23/2013   PLT 197 03/23/2013   NEUTROABS 2.1 08/17/2012     CMP      Component Value Date/Time   NA 138 06/21/2015 1617   K 4.1 06/21/2015 1617   CL 103 06/21/2015 1617   CO2 29 06/21/2015 1617   GLUCOSE 94 06/21/2015 1617   BUN 14 06/21/2015 1617   CREATININE 0.72 06/21/2015 1617   CREATININE 0.61* 06/05/2015 0911   CALCIUM 9.5 06/21/2015 1617   PROT 7.2 06/21/2015 1617   ALBUMIN 4.2 06/21/2015 1617   AST 17 06/21/2015 1617   ALT 11 06/21/2015  1617   ALKPHOS 88 06/21/2015 1617   BILITOT 0.3 06/21/2015 1617   GFRNONAA >89 06/05/2015 0911   GFRNONAA >90 08/17/2012 0915   GFRAA >89 06/05/2015 0911   GFRAA >90 08/17/2012 0915   CA 19-9 on 06/21/2015-94   Imaging:  As per history of present illness, I reviewed the CT chest 07/04/2015 and [facility] abdomen CT images with Perry Robinson   Assessment/Plan:   1. Adenocarcinoma of the pancreas body, clinical stage IA (T1 N0)  EUS 06/28/2015 revealed no evidence of vascular invasion or lymphadenopathy, FNA biopsy of the pancreas body mass confirmed adenocarcinoma  No evidence for metastatic disease on a staging chest CT 07/04/2015  [facility] CT 06/07/2015 confirmed a pancreas body mass, questionable extension to the superior mesenteric artery  Elevated CA 19-9   2. Prostate cancer in 2006, status post a prostatectomy  3.   COPD/ongoing tobacco use  4.    Hypertension   Disposition:   Perry Robinson has been diagnosed with adenocarcinoma of the pancreas. His case was presented at the GI tumor conference . He is felt to be a candidate for surgical resection. We will schedule an appointment with [name] to evaluate him for surgery.  If she agrees with surgery I will see him in the postoperative setting. I explained that patients with resected pancreas cancer are at high risk for developing recurrent disease. I will recommend adjuvant chemotherapy.  He will return for an office visit several weeks after surgery. We are available to see him in the interim as needed.  Approximately 50 minutes were spent with the patient today. The majority of the time was used for counseling and coordination of care.  Betsy Coder, MD  07/12/2015, 2:18 PM

## 2015-07-20 ENCOUNTER — Other Ambulatory Visit: Payer: Self-pay | Admitting: General Surgery

## 2015-07-20 ENCOUNTER — Encounter: Payer: Self-pay | Admitting: *Deleted

## 2015-07-20 ENCOUNTER — Ambulatory Visit: Payer: Managed Care, Other (non HMO) | Admitting: Physical Therapy

## 2015-07-20 ENCOUNTER — Ambulatory Visit: Payer: Managed Care, Other (non HMO) | Admitting: Nutrition

## 2015-07-20 NOTE — Progress Notes (Signed)
Oncology Nurse Navigator Documentation  Oncology Nurse Navigator Flowsheets 07/20/2015  Navigator Location [facility]  Navigator Encounter Type Clinic/[facility]C  Abnormal Finding Date -  Confirmed Diagnosis Date -  Patient Visit Type Surgery--[name]  Treatment Phase Pre-Tx/Tx Discussion  Barriers/Navigation Needs Education  Education Preparing for Upcoming Surgery/ Treatment--distal pancreatectomy with splenectomy  Interventions Education Method  Coordination of Care -  Education Method Verbal;Written  Support Groups/Services -  Acuity Level 2  Time Spent with Patient 30  Reviewed surgeon's recommendations after appointment. We will call him to set up his post splenectomy vaccines and Dr. Benay Spice will see him postop. He was also seen today by CSW and dietician. He declined to talk with physical therapy. He was very anxious to leave to get back to work.

## 2015-07-20 NOTE — Progress Notes (Signed)
Patient was seen in GI clinic.  65 year old male diagnosed with cancer of the pancreas.  He is a patient of Dr. [name].  Past medical history includes hypertension, GERD, prostate cancer, tobacco, alcohol, and cervical disc surgery in February 2017.  Medications were reviewed.  Labs were reviewed.  Height: 68.75 inches. Weight: 153.9 pounds on May 11. Usual body weight: 164 pounds December 2016. BMI: 22.9.  Patient denies nutrition impact symptoms. Reports he generally eats 3 meals with a snack. Patient eats a variety of foods. Patient has lost about 10 pounds over 5 months.  Nutrition diagnosis: Food and nutrition related knowledge deficit related to pancreas cancer as evidenced by no prior need for nutrition related information.  Intervention: Educated patient consume adequate calories and protein to promote weight maintenance. Reviewed potential side effects and encouraged patient to communicate any difficulty eating. Provided fact sheet on ways to increase calories and protein. Encouraged patient to consume oral nutrition supplements with added calories and protein prior to surgery. Provided samples and coupons. Questions were answered.  Teach back method used.  Contact information was provided  Monitoring, evaluation, goals: Patient will tolerate adequate calories and protein to promote weight maintenance and adequate healing from surgery.  Next visit: To be scheduled as needed.  **Disclaimer: This note was dictated with voice recognition software. Similar sounding words can inadvertently be transcribed and this note may contain transcription errors which may not have been corrected upon publication of note.**

## 2015-07-20 NOTE — Progress Notes (Signed)
[facility] Psychosocial Distress Screening Clinical Social Work  Clinical Social Work met with pt at GI [facility] to introduce self, explain role of CSW/Pt and Family Support Team and to review distress screening protocol. Clinical Social Work was referred by distress screening protocol.  The patient scored a 0 on the Psychosocial Distress Thermometer which indicates no distress. Clinical Social Worker discussed common emotions assessed for distress and other psychosocial needs. Pt shared was quite angry at time of diagnosis. CSW validated that this is a common emotion. Pt reports he is coping well currently and adjusting appropriately. He reports to have a strong support system. CSW reviewed Support Programs, GI group and other as options for additional support. Pt encouraged to also reach out to CSW as needed for further support, resources and assistance.   ONCBCN DISTRESS SCREENING 07/20/2015  Screening Type Initial Screening  Distress experienced in past week (1-10) 0  Emotional problem type Adjusting to illness  Physical Problem type Tingling hands/feet  Referral to support programs Yes    Clinical Social Worker follow up needed: No.  If yes, follow up plan:  Grier Hock, LCSW Clinical Social Worker  Cancer Center  [facility] Phone: [phone] Fax: [phone]    

## 2015-08-01 ENCOUNTER — Encounter: Payer: Self-pay | Admitting: Genetic Counselor

## 2015-08-02 ENCOUNTER — Ambulatory Visit: Payer: Self-pay | Admitting: General Surgery

## 2015-08-02 DIAGNOSIS — C251 Malignant neoplasm of body of pancreas: Secondary | ICD-10-CM

## 2015-08-02 HISTORY — DX: Malignant neoplasm of body of pancreas: C25.1

## 2015-08-09 ENCOUNTER — Other Ambulatory Visit: Payer: Self-pay | Admitting: *Deleted

## 2015-08-09 NOTE — Progress Notes (Signed)
Return message from [name] to give vaccines week of 6/19 in preparation for splenectomy. Called home # and left VM to expect call from scheduler today for his vaccines on week of 6/19.

## 2015-08-09 NOTE — Progress Notes (Signed)
Oncology Nurse Navigator Documentation  Oncology Nurse Navigator Flowsheets 08/09/2015  Navigator Location [facility]  Navigator Encounter Type -  Abnormal Finding Date -  Confirmed Diagnosis Date -  Patient Visit Type -  Treatment Phase -  Barriers/Navigation Needs Coordination of Care--move f/u appointment since surgery not until 08/27/15  Education -  Interventions Coordination of Care--POF to scheduler to move f/u here to week of 7/10 per Dr. Benay Spice.  Coordination of Care Appts  Education Method -  Support Groups/Services -  Acuity -  Time Spent with Patient 15

## 2015-08-10 ENCOUNTER — Telehealth: Payer: Self-pay | Admitting: Oncology

## 2015-08-10 NOTE — Telephone Encounter (Signed)
per pof to sch vaccines (no specific type given)cld & spoke w/Jean-spouse and gave appt time 6/19@11 :15

## 2015-08-20 ENCOUNTER — Ambulatory Visit (HOSPITAL_BASED_OUTPATIENT_CLINIC_OR_DEPARTMENT_OTHER): Payer: Managed Care, Other (non HMO)

## 2015-08-20 ENCOUNTER — Other Ambulatory Visit: Payer: Self-pay | Admitting: *Deleted

## 2015-08-20 VITALS — BP 125/67 | HR 90 | Temp 98.7°F | Resp 18

## 2015-08-20 DIAGNOSIS — Z23 Encounter for immunization: Secondary | ICD-10-CM

## 2015-08-20 DIAGNOSIS — C251 Malignant neoplasm of body of pancreas: Secondary | ICD-10-CM

## 2015-08-20 MED ORDER — PNEUMOCOCCAL 13-VAL CONJ VACC IM SUSP
0.5000 mL | Freq: Once | INTRAMUSCULAR | Status: AC
  Administered 2015-08-20: 0.5 mL via INTRAMUSCULAR
  Filled 2015-08-20: qty 0.5

## 2015-08-20 MED ORDER — MENINGOCOCCAL VAC A,C,Y,W-135 ~~LOC~~ INJ
0.5000 mL | INJECTION | Freq: Once | SUBCUTANEOUS | Status: AC
  Administered 2015-08-20: 0.5 mL via SUBCUTANEOUS
  Filled 2015-08-20: qty 0.5

## 2015-08-20 MED ORDER — HAEMOPHILUS B POLYSAC CONJ VAC IM SOLR
0.5000 mL | Freq: Once | INTRAMUSCULAR | Status: AC
Start: 2015-08-20 — End: 2015-08-20
  Administered 2015-08-20: 0.5 mL via INTRAMUSCULAR
  Filled 2015-08-20: qty 0.5

## 2015-08-20 NOTE — Patient Instructions (Signed)
Pneumococcal Vaccine, Polyvalent suspension for injection What is this medicine? PNEUMOCOCCAL VACCINE (NEU mo KOK al vak SEEN) is a vaccine used to prevent pneumococcus bacterial infections. These bacteria can cause serious infections like pneumonia, meningitis, and blood infections. This vaccine will lower your chance of getting pneumonia. If you do get pneumonia, it can make your symptoms milder and your illness shorter. This vaccine will not treat an infection and will not cause infection. This vaccine is recommended for infants and young children, adults with certain medical conditions, and adults 65 years or older. This medicine may be used for other purposes; ask your health care provider or pharmacist if you have questions. What should I tell my health care provider before I take this medicine? They need to know if you have any of these conditions: -bleeding problems -fever -immune system problems -an unusual or allergic reaction to pneumococcal vaccine, diphtheria toxoid, other vaccines, latex, other medicines, foods, dyes, or preservatives -pregnant or trying to get pregnant -breast-feeding How should I use this medicine? This vaccine is for injection into a muscle. It is given by a health care professional. A copy of Vaccine Information Statements will be given before each vaccination. Read this sheet carefully each time. The sheet may change frequently. Talk to your pediatrician regarding the use of this medicine in children. While this drug may be prescribed for children as young as 71 weeks old for selected conditions, precautions do apply. Overdosage: If you think you have taken too much of this medicine contact a poison control center or emergency room at once. NOTE: This medicine is only for you. Do not share this medicine with others. What if I miss a dose? It is important not to miss your dose. Call your doctor or health care professional if you are unable to keep an  appointment. What may interact with this medicine? -medicines for cancer chemotherapy -medicines that suppress your immune function -steroid medicines like prednisone or cortisone This list may not describe all possible interactions. Give your health care provider a list of all the medicines, herbs, non-prescription drugs, or dietary supplements you use. Also tell them if you smoke, drink alcohol, or use illegal drugs. Some items may interact with your medicine. What should I watch for while using this medicine? Mild fever and pain should go away in 3 days or less. Report any unusual symptoms to your doctor or health care professional. What side effects may I notice from receiving this medicine? Side effects that you should report to your doctor or health care professional as soon as possible: -allergic reactions like skin rash, itching or hives, swelling of the face, lips, or tongue -breathing problems -confused -fast or irregular heartbeat -fever over 102 degrees F -seizures -unusual bleeding or bruising -unusual muscle weakness Side effects that usually do not require medical attention (report to your doctor or health care professional if they continue or are bothersome): -aches and pains -diarrhea -fever of 102 degrees F or less -headache -irritable -loss of appetite -pain, tender at site where injected -trouble sleeping This list may not describe all possible side effects. Call your doctor for medical advice about side effects. You may report side effects to FDA at 1-800-FDA-1088. Where should I keep my medicine? This does not apply. This vaccine is given in a clinic, pharmacy, doctor's office, or other health care setting and will not be stored at home. NOTE: This sheet is a summary. It may not cover all possible information. If you have questions about this  medicine, talk to your doctor, pharmacist, or health care provider.    2016, Elsevier/Gold Standard. (2013-11-24  10:27:27) Meningococcal Diphtheria Toxoid Conjugate Vaccine What is this medicine? MENINGOCOCCAL DIPHTHERIA TOXOID CONJUGATE VACCINE (muh ning goh KOK kal dif THEER ee uh TOK soid KON juh geyt vak SEEN) is a vaccine to protect from bacterial meningitis. This vaccine does not contain live bacteria. It will not cause a meningitis. This medicine may be used for other purposes; ask your health care provider or pharmacist if you have questions. What should I tell my health care provider before I take this medicine? They need to know if you have any of these conditions: -bleeding disorder -fever or infection -history of Guillain-Barre syndrome -immune system problems -an unusual or allergic reaction to diphtheria toxoid, meningococcal vaccine, latex, other medicines, foods, dyes, or preservatives -pregnant or trying to get pregnant -breast-feeding How should I use this medicine? This medicine is for injection into a muscle. It is given by a health care professional in a hospital or clinic setting. A copy of Vaccine Information Statements will be given before each vaccination. Read this sheet carefully each time. The sheet may change frequently. Talk to your pediatrician regarding the use of this medicine in children. While some brands of this drug may be prescribed for children as young as 74 months of age for selected conditions, precautions do apply. Overdosage: If you think you have taken too much of this medicine contact a poison control center or emergency room at once. NOTE: This medicine is only for you. Do not share this medicine with others. What if I miss a dose? This does not apply. What may interact with this medicine? -adalimumab -anakinra -infliximab -medicines for organ transplant -medicines to treat cancer -medicines used during some procedures to diagnose a medical condition -other vaccines -some medicines for arthritis -steroid medicines like prednisone or cortisone This  list may not describe all possible interactions. Give your health care provider a list of all the medicines, herbs, non-prescription drugs, or dietary supplements you use. Also tell them if you smoke, drink alcohol, or use illegal drugs. Some items may interact with your medicine. What should I watch for while using this medicine? Report any side effects that are worrisome to your doctor right away. Call your doctor if you have any unusual symptoms within 6 weeks of getting this vaccine. This vaccine may not protect from all meningitis infections. Women should inform their doctor if they wish to become pregnant or think they might be pregnant. Talk to your health care professional or pharmacist for more information. What side effects may I notice from receiving this medicine? Side effects that you should report to your doctor or health care professional as soon as possible: -allergic reactions like skin rash, itching or hives, swelling of the face, lips, or tongue -breathing problems -feeling faint or lightheaded, falls -fever over 102 degrees F -muscle weakness -unusual drooping or paralysis of face Side effects that usually do not require medical attention (report to your doctor or health care professional if they continue or are bothersome): -chills -diarrhea -headache -loss of appetite -muscle aches and pains -pain at site where injected -tired This list may not describe all possible side effects. Call your doctor for medical advice about side effects. You may report side effects to FDA at 1-800-FDA-1088. Where should I keep my medicine? This drug is given in a hospital or clinic and will not be stored at home. NOTE: This sheet is a summary. It may  not cover all possible information. If you have questions about this medicine, talk to your doctor, pharmacist, or health care provider.    2016, Elsevier/Gold Standard. (2009-07-10 21:41:10) Diphtheria Toxoid; Tetanus Toxoid Adsorbed, DT,  Td What is this medicine? DIPHTHERIA AND TETANUS TOXOIDS ADSORBED (dif THEER ee uh and TET n [place] TOK soids ad SAWRB) is a vaccine. It is used to prevent infections of diphtheria and tetanus (lockjaw). This medicine may be used for other purposes; ask your health care provider or pharmacist if you have questions. What should I tell my health care provider before I take this medicine? They need to know if you have any of these conditions: -bleeding disorder -immune system problems -infection with fever -low levels of platelets in the blood -an unusual or allergic reaction to diphtheria or tetanus toxoid, latex, thimerosal, other medicines, foods, dyes, or preservatives -pregnant or trying to get pregnant -breast-feeding How should I use this medicine? This vaccine is for injection into a muscle. It is given by a health care professional. A copy of Vaccine Information Statements will be given before each vaccination. Read this sheet carefully each time. The sheet may change frequently. Talk to your pediatrician regarding the use of this medicine in children. While this drug may be prescribed for selected conditions, precautions do apply. Overdosage: If you think you have taken too much of this medicine contact a poison control center or emergency room at once. NOTE: This medicine is only for you. Do not share this medicine with others. What if I miss a dose? Keep appointments for follow-up (booster) doses as directed. It is important not to miss your dose. Call your doctor or health care professional if you are unable to keep an appointment. What may interact with this medicine? -adalimumab -anakinra -infliximab -live vaccines -medicines that suppress your immune system -medicines to treat cancer -medicines that treat or prevent blood clots like daily aspirin, enoxaparin, heparin, ticlopidine, warfarin -radiopharmaceuticals like iodine I-125 or I-131 This list may not describe all possible  interactions. Give your health care provider a list of all the medicines, herbs, non-prescription drugs, or dietary supplements you use. Also tell them if you smoke, drink alcohol, or use illegal drugs. Some items may interact with your medicine. What should I watch for while using this medicine? Contact your doctor or health care professional and seek emergency medical care if any serious side effects occur. This vaccine, like all vaccines, may not fully protect everyone. What side effects may I notice from receiving this medicine? Side effects that you should report to your doctor or health care professional as soon as possible: -allergic reactions like skin rash, itching or hives, swelling of the face, lips, or tongue -arthritis pain -breathing problems -changes in hearing -extreme changes in behavior -fast, irregular heartbeat -fever over 100 degrees F -pain, tingling, numbness in the hands or feet -seizures -unusually weak or tired Side effects that usually do not require medical attention (report to your doctor or health care professional if they continue or are bothersome): -aches or pains -bruising, pain, swelling at site where injected -headache -loss of appetite -low-grade fever of 100 degrees F or less -nausea, vomiting -sleepy -swollen glands This list may not describe all possible side effects. Call your doctor for medical advice about side effects. You may report side effects to FDA at 1-800-FDA-1088. Where should I keep my medicine? This drug is given in a hospital or clinic and will not be stored at home. NOTE: This sheet is a  summary. It may not cover all possible information. If you have questions about this medicine, talk to your doctor, pharmacist, or health care provider.    2016, Elsevier/Gold Standard. (2007-06-17 13:57:36)

## 2015-08-22 ENCOUNTER — Encounter (HOSPITAL_COMMUNITY): Payer: Self-pay

## 2015-08-22 ENCOUNTER — Encounter (HOSPITAL_COMMUNITY)
Admission: RE | Admit: 2015-08-22 | Discharge: 2015-08-22 | Disposition: A | Discharge status: Home / Self Care | Payer: Managed Care, Other (non HMO) | Source: Ambulatory Visit | Attending: General Surgery | Admitting: General Surgery

## 2015-08-22 DIAGNOSIS — Z01818 Encounter for other preprocedural examination: Secondary | ICD-10-CM | POA: Insufficient documentation

## 2015-08-22 DIAGNOSIS — Z0183 Encounter for blood typing: Secondary | ICD-10-CM | POA: Diagnosis not present

## 2015-08-22 DIAGNOSIS — Z01812 Encounter for preprocedural laboratory examination: Secondary | ICD-10-CM | POA: Insufficient documentation

## 2015-08-22 DIAGNOSIS — R9431 Abnormal electrocardiogram [ECG] [EKG]: Secondary | ICD-10-CM | POA: Insufficient documentation

## 2015-08-22 DIAGNOSIS — K4090 Unilateral inguinal hernia, without obstruction or gangrene, not specified as recurrent: Secondary | ICD-10-CM | POA: Insufficient documentation

## 2015-08-22 HISTORY — DX: Unilateral inguinal hernia, without obstruction or gangrene, not specified as recurrent: K40.90

## 2015-08-22 LAB — CBC WITH DIFFERENTIAL/PLATELET
BASOS PCT: 1 %
Basophils Absolute: 0 10*3/uL (ref 0.0–0.1)
Eosinophils Absolute: 0.2 10*3/uL (ref 0.0–0.7)
Eosinophils Relative: 4 %
HEMATOCRIT: 43 % (ref 39.0–52.0)
HEMOGLOBIN: 14.1 g/dL (ref 13.0–17.0)
LYMPHS PCT: 36 %
Lymphs Abs: 1.8 10*3/uL (ref 0.7–4.0)
MCH: 31.2 pg (ref 26.0–34.0)
MCHC: 32.8 g/dL (ref 30.0–36.0)
MCV: 95.1 fL (ref 78.0–100.0)
MONO ABS: 0.3 10*3/uL (ref 0.1–1.0)
MONOS PCT: 5 %
NEUTROS ABS: 2.7 10*3/uL (ref 1.7–7.7)
NEUTROS PCT: 54 %
Platelets: 184 10*3/uL (ref 150–400)
RBC: 4.52 MIL/uL (ref 4.22–5.81)
RDW: 14.3 % (ref 11.5–15.5)
WBC: 5.1 10*3/uL (ref 4.0–10.5)

## 2015-08-22 LAB — COMPREHENSIVE METABOLIC PANEL
ALBUMIN: 4.1 g/dL (ref 3.5–5.0)
ALK PHOS: 72 U/L (ref 38–126)
ALT: 17 U/L (ref 17–63)
ANION GAP: 7 (ref 5–15)
AST: 29 U/L (ref 15–41)
BILIRUBIN TOTAL: 0.2 mg/dL — AB (ref 0.3–1.2)
BUN: 16 mg/dL (ref 6–20)
CALCIUM: 9.9 mg/dL (ref 8.9–10.3)
CO2: 29 mmol/L (ref 22–32)
Chloride: 103 mmol/L (ref 101–111)
Creatinine, Ser: 0.74 mg/dL (ref 0.61–1.24)
GFR calc Af Amer: >60 mL/min (ref >60)
GLUCOSE: 108 mg/dL — AB (ref 65–99)
POTASSIUM: 4.1 mmol/L (ref 3.5–5.1)
Sodium: 139 mmol/L (ref 135–145)
TOTAL PROTEIN: 6.8 g/dL (ref 6.5–8.1)

## 2015-08-22 LAB — PREPARE RBC (CROSSMATCH)

## 2015-08-22 LAB — PROTIME-INR
INR: 0.96 (ref 0.00–1.49)
Prothrombin Time: 13 seconds (ref 11.6–15.2)

## 2015-08-22 LAB — ABO/RH: ABO/RH(D): B POS

## 2015-08-22 NOTE — Pre-Procedure Instructions (Addendum)
Perry Robinson  08/22/2015      [id number] - [address], [place] - [address] AT [facility] [address] [place] [address] Phone: [phone] Fax: [phone]    Your procedure is scheduled on 08/27/15  Report to [facility] Admitting at 715 A.M.  Call this number if you have problems the morning of surgery:  [phone]   Remember:  Do not eat food or drink liquids after midnight.  Take these medicines the morning of surgery with A SIP OF WATER none   STOP all herbel meds, nsaids (aleve,naproxen,advil,ibuprofen) starting now including vitamins, aspirin   Do not wear jewelry, make-up or nail polish.  Do not wear lotions, powders, or perfumes.  You may wear deoderant.  Do not shave 48 hours prior to surgery.  Men may shave face and neck.  Do not bring valuables to the hospital.  [facility] is not responsible for any belongings or valuables.  Contacts, dentures or bridgework may not be worn into surgery.  Leave your suitcase in the car.  After surgery it may be brought to your room.  For patients admitted to the hospital, discharge time will be determined by your treatment team.  Patients discharged the day of surgery will not be allowed to drive home.   Name and phone number of your driver:    Special instructions:   Special Instructions: [facility] - Preparing for Surgery  Before surgery, you can play an important role.  Because skin is not sterile, your skin needs to be as free of germs as possible.  You can reduce the number of germs on you skin by washing with CHG (chlorahexidine gluconate) soap before surgery.  CHG is an antiseptic cleaner which kills germs and bonds with the skin to continue killing germs even after washing.  Please DO NOT use if you have an allergy to CHG or antibacterial soaps.  If your skin becomes reddened/irritated stop using the CHG and inform your nurse when you arrive at [facility]  Stay.  Do not shave (including legs and underarms) for at least 48 hours prior to the first CHG shower.  You may shave your face.  Please follow these instructions carefully:   1.  Shower with CHG Soap the night before surgery and the morning of Surgery.  2.  If you choose to wash your hair, wash your hair first as usual with your normal shampoo.  3.  After you shampoo, rinse your hair and body thoroughly to remove the Shampoo.  4.  Use CHG as you would any other liquid soap.  You can apply chg directly  to the skin and wash gently with scrungie or a clean washcloth.  5.  Apply the CHG Soap to your body ONLY FROM THE NECK DOWN.  Do not use on open wounds or open sores.  Avoid contact with your eyes ears, mouth and genitals (private parts).  Wash genitals (private parts)       with your normal soap.  6.  Wash thoroughly, paying special attention to the area where your surgery will be performed.  7.  Thoroughly rinse your body with warm water from the neck down.  8.  DO NOT shower/wash with your normal soap after using and rinsing off the CHG Soap.  9.  Pat yourself dry with a clean towel.            10.  Wear clean  pajamas.            11.  Place clean sheets on your bed the night of your first shower and do not sleep with pets.  Day of Surgery  Do not apply any lotions/deodorants the morning of surgery.  Please wear clean clothes to the hospital/surgery center.  Please read over the following fact sheets that you were given. Pain Booklet

## 2015-08-24 ENCOUNTER — Ambulatory Visit: Payer: Managed Care, Other (non HMO) | Admitting: Oncology

## 2015-08-27 ENCOUNTER — Encounter (HOSPITAL_COMMUNITY): Payer: Self-pay | Admitting: *Deleted

## 2015-08-27 ENCOUNTER — Inpatient Hospital Stay (HOSPITAL_COMMUNITY): Payer: Managed Care, Other (non HMO) | Admitting: Vascular Surgery

## 2015-08-27 ENCOUNTER — Encounter (HOSPITAL_COMMUNITY)
Admission: RE | Disposition: A | Discharge status: Home / Self Care | Payer: Self-pay | Source: Ambulatory Visit | Attending: General Surgery

## 2015-08-27 ENCOUNTER — Inpatient Hospital Stay (HOSPITAL_COMMUNITY)
Admission: RE | Admit: 2015-08-27 | Discharge: 2015-09-01 | DRG: 406 | Disposition: A | Discharge status: Home / Self Care | Payer: Managed Care, Other (non HMO) | Source: Ambulatory Visit | Attending: General Surgery | Admitting: General Surgery

## 2015-08-27 ENCOUNTER — Inpatient Hospital Stay (HOSPITAL_COMMUNITY): Payer: Managed Care, Other (non HMO) | Admitting: Certified Registered Nurse Anesthetist

## 2015-08-27 DIAGNOSIS — Z8042 Family history of malignant neoplasm of prostate: Secondary | ICD-10-CM

## 2015-08-27 DIAGNOSIS — K5730 Diverticulosis of large intestine without perforation or abscess without bleeding: Secondary | ICD-10-CM | POA: Diagnosis present

## 2015-08-27 DIAGNOSIS — C251 Malignant neoplasm of body of pancreas: Principal | ICD-10-CM | POA: Diagnosis present

## 2015-08-27 DIAGNOSIS — C61 Malignant neoplasm of prostate: Secondary | ICD-10-CM | POA: Diagnosis present

## 2015-08-27 DIAGNOSIS — K4090 Unilateral inguinal hernia, without obstruction or gangrene, not specified as recurrent: Secondary | ICD-10-CM | POA: Diagnosis present

## 2015-08-27 DIAGNOSIS — K567 Ileus, unspecified: Secondary | ICD-10-CM | POA: Diagnosis not present

## 2015-08-27 DIAGNOSIS — I10 Essential (primary) hypertension: Secondary | ICD-10-CM | POA: Diagnosis present

## 2015-08-27 HISTORY — DX: Diverticulosis of intestine, part unspecified, without perforation or abscess without bleeding: K57.90

## 2015-08-27 HISTORY — PX: INSERTION OF MESH: SHX5868

## 2015-08-27 HISTORY — PX: INGUINAL HERNIA REPAIR: SHX194

## 2015-08-27 HISTORY — PX: LAPAROSCOPY: SHX197

## 2015-08-27 HISTORY — DX: Malignant neoplasm of body of pancreas: C25.1

## 2015-08-27 LAB — CREATININE, SERUM: Creatinine, Ser: 0.8 mg/dL (ref 0.61–1.24)

## 2015-08-27 LAB — CBC
HEMATOCRIT: 42.2 % (ref 39.0–52.0)
HEMOGLOBIN: 13.9 g/dL (ref 13.0–17.0)
MCH: 31.8 pg (ref 26.0–34.0)
MCHC: 32.9 g/dL (ref 30.0–36.0)
MCV: 96.6 fL (ref 78.0–100.0)
Platelets: 176 10*3/uL (ref 150–400)
RBC: 4.37 MIL/uL (ref 4.22–5.81)
RDW: 14.4 % (ref 11.5–15.5)
WBC: 15.6 10*3/uL — ABNORMAL HIGH (ref 4.0–10.5)

## 2015-08-27 LAB — GLUCOSE, CAPILLARY: GLUCOSE-CAPILLARY: 187 mg/dL — AB (ref 65–99)

## 2015-08-27 SURGERY — LAPAROSCOPY, DIAGNOSTIC
Anesthesia: General | Site: Inguinal | Laterality: Right

## 2015-08-27 MED ORDER — FENTANYL CITRATE (PF) 100 MCG/2ML IJ SOLN
INTRAMUSCULAR | Status: DC | PRN
  Administered 2015-08-27 (×5): 50 ug via INTRAVENOUS
  Administered 2015-08-27: 100 ug via INTRAVENOUS
  Administered 2015-08-27 (×3): 50 ug via INTRAVENOUS

## 2015-08-27 MED ORDER — ROCURONIUM BROMIDE 50 MG/5ML IV SOLN
INTRAVENOUS | Status: AC
  Filled 2015-08-27: qty 1

## 2015-08-27 MED ORDER — ACETAMINOPHEN 650 MG RE SUPP: 650.0000 mg | RECTAL | Status: DC | PRN

## 2015-08-27 MED ORDER — ENOXAPARIN SODIUM 40 MG/0.4ML ~~LOC~~ SOLN
40.0000 mg | SUBCUTANEOUS | Status: DC
  Administered 2015-08-28 – 2015-09-01 (×5): 40 mg via SUBCUTANEOUS
  Filled 2015-08-27 (×5): qty 0.4

## 2015-08-27 MED ORDER — BUPIVACAINE 0.25 % ON-Q PUMP DUAL CATH 300 ML
300.0000 mL | INJECTION | Status: DC
  Filled 2015-08-27: qty 300

## 2015-08-27 MED ORDER — ACETAMINOPHEN 650 MG RE SUPP: 650.0000 mg | Freq: Four times a day (QID) | RECTAL | Status: DC | PRN

## 2015-08-27 MED ORDER — NALOXONE HCL 0.4 MG/ML IJ SOLN: 0.4000 mg | INTRAMUSCULAR | Status: DC | PRN

## 2015-08-27 MED ORDER — EVICEL 5 ML EX KIT
PACK | CUTANEOUS | Status: DC | PRN
  Administered 2015-08-27 (×2): 1

## 2015-08-27 MED ORDER — HYDROMORPHONE HCL 1 MG/ML IJ SOLN
0.2500 mg | INTRAMUSCULAR | Status: DC | PRN
  Administered 2015-08-27 (×4): 0.5 mg via INTRAVENOUS

## 2015-08-27 MED ORDER — MIDAZOLAM HCL 5 MG/5ML IJ SOLN
INTRAMUSCULAR | Status: DC | PRN
  Administered 2015-08-27: 2 mg via INTRAVENOUS

## 2015-08-27 MED ORDER — BUPIVACAINE-EPINEPHRINE 0.25% -1:200000 IJ SOLN
INTRAMUSCULAR | Status: DC | PRN
Start: 2015-08-27 — End: 2015-08-27
  Administered 2015-08-27: 15 mL

## 2015-08-27 MED ORDER — EVICEL 5 ML EX KIT
PACK | CUTANEOUS | Status: AC
  Filled 2015-08-27: qty 1

## 2015-08-27 MED ORDER — MIDAZOLAM HCL 2 MG/2ML IJ SOLN
INTRAMUSCULAR | Status: AC
  Filled 2015-08-27: qty 2

## 2015-08-27 MED ORDER — DOCUSATE SODIUM 100 MG PO CAPS
100.0000 mg | ORAL_CAPSULE | Freq: Two times a day (BID) | ORAL | Status: DC
  Administered 2015-08-28 – 2015-09-01 (×9): 100 mg via ORAL
  Filled 2015-08-27 (×9): qty 1

## 2015-08-27 MED ORDER — HYDROMORPHONE 1 MG/ML IV SOLN
INTRAVENOUS | Status: DC
  Administered 2015-08-27: 15:00:00 via INTRAVENOUS
  Administered 2015-08-27 (×2): 0.3 mg via INTRAVENOUS
  Administered 2015-08-28 (×2): 0.6 mg via INTRAVENOUS
  Administered 2015-08-28: 0 mg via INTRAVENOUS
  Administered 2015-08-28: 0.3 mg via INTRAVENOUS
  Administered 2015-08-29: 0 mg via INTRAVENOUS
  Administered 2015-08-29 (×2): 0.3 mg via INTRAVENOUS
  Administered 2015-08-29 – 2015-08-30 (×4): 0 mg via INTRAVENOUS

## 2015-08-27 MED ORDER — HEPARIN SODIUM (PORCINE) 1000 UNIT/ML IJ SOLN: INTRAMUSCULAR | Status: DC | PRN

## 2015-08-27 MED ORDER — BUPIVACAINE 0.25 % ON-Q PUMP SINGLE CATH 300ML
300.0000 mL | INJECTION | Status: DC
  Filled 2015-08-27: qty 300

## 2015-08-27 MED ORDER — BUPIVACAINE HCL (PF) 0.25 % IJ SOLN
INTRAMUSCULAR | Status: AC
  Filled 2015-08-27: qty 30

## 2015-08-27 MED ORDER — BUPIVACAINE ON-Q PAIN PUMP (FOR ORDER SET NO CHG)
INJECTION | Status: DC
  Filled 2015-08-27: qty 1

## 2015-08-27 MED ORDER — STERILE WATER FOR IRRIGATION IR SOLN
Status: DC | PRN
  Administered 2015-08-27: 1000 mL

## 2015-08-27 MED ORDER — SODIUM CHLORIDE 0.9% FLUSH: 3.0000 mL | INTRAVENOUS | Status: DC | PRN

## 2015-08-27 MED ORDER — SUGAMMADEX SODIUM 200 MG/2ML IV SOLN
INTRAVENOUS | Status: DC | PRN
  Administered 2015-08-27: 200 mg via INTRAVENOUS

## 2015-08-27 MED ORDER — LIDOCAINE HCL (PF) 1 % IJ SOLN
INTRAMUSCULAR | Status: AC
  Filled 2015-08-27: qty 30

## 2015-08-27 MED ORDER — KCL IN DEXTROSE-NACL 20-5-0.45 MEQ/L-%-% IV SOLN
INTRAVENOUS | Status: DC
  Administered 2015-08-27 – 2015-08-30 (×6): via INTRAVENOUS
  Filled 2015-08-27 (×9): qty 1000

## 2015-08-27 MED ORDER — MORPHINE SULFATE (PF) 2 MG/ML IV SOLN: 2.0000 mg | INTRAVENOUS | Status: DC | PRN

## 2015-08-27 MED ORDER — DIPHENHYDRAMINE HCL 50 MG/ML IJ SOLN: 12.5000 mg | Freq: Four times a day (QID) | INTRAMUSCULAR | Status: DC | PRN

## 2015-08-27 MED ORDER — OXYCODONE HCL 5 MG/5ML PO SOLN: 5.0000 mg | Freq: Once | ORAL | Status: DC | PRN

## 2015-08-27 MED ORDER — PROPOFOL 10 MG/ML IV BOLUS
INTRAVENOUS | Status: AC
  Filled 2015-08-27: qty 20

## 2015-08-27 MED ORDER — SODIUM CHLORIDE 0.9 % IV SOLN: 250.0000 mL | INTRAVENOUS | Status: DC | PRN

## 2015-08-27 MED ORDER — LIDOCAINE HCL (CARDIAC) 20 MG/ML IV SOLN
INTRAVENOUS | Status: DC | PRN
  Administered 2015-08-27: 60 mg via INTRAVENOUS

## 2015-08-27 MED ORDER — DIPHENHYDRAMINE HCL 12.5 MG/5ML PO ELIX: 12.5000 mg | ORAL_SOLUTION | Freq: Four times a day (QID) | ORAL | Status: DC | PRN

## 2015-08-27 MED ORDER — LACTATED RINGERS IV SOLN
INTRAVENOUS | Status: DC | PRN
  Administered 2015-08-27 (×3): via INTRAVENOUS

## 2015-08-27 MED ORDER — ACETAMINOPHEN 325 MG PO TABS: 650.0000 mg | ORAL_TABLET | ORAL | Status: DC | PRN

## 2015-08-27 MED ORDER — LACTATED RINGERS IV SOLN
INTRAVENOUS | Status: DC
  Administered 2015-08-27 (×2): via INTRAVENOUS

## 2015-08-27 MED ORDER — ACETAMINOPHEN 325 MG PO TABS: 650.0000 mg | ORAL_TABLET | Freq: Four times a day (QID) | ORAL | Status: DC | PRN

## 2015-08-27 MED ORDER — ONDANSETRON HCL 4 MG/2ML IJ SOLN
INTRAMUSCULAR | Status: DC | PRN
  Administered 2015-08-27: 4 mg via INTRAVENOUS

## 2015-08-27 MED ORDER — BUPIVACAINE-EPINEPHRINE (PF) 0.25% -1:200000 IJ SOLN
INTRAMUSCULAR | Status: AC
  Filled 2015-08-27: qty 30

## 2015-08-27 MED ORDER — SIMETHICONE 80 MG PO CHEW: 40.0000 mg | CHEWABLE_TABLET | Freq: Four times a day (QID) | ORAL | Status: DC | PRN

## 2015-08-27 MED ORDER — PROPOFOL 10 MG/ML IV BOLUS
INTRAVENOUS | Status: DC | PRN
  Administered 2015-08-27: 200 mg via INTRAVENOUS

## 2015-08-27 MED ORDER — CEFAZOLIN SODIUM-DEXTROSE 2-4 GM/100ML-% IV SOLN
2.0000 g | INTRAVENOUS | Status: AC
  Administered 2015-08-27: 2 g via INTRAVENOUS
  Filled 2015-08-27: qty 100

## 2015-08-27 MED ORDER — ONDANSETRON HCL 4 MG/2ML IJ SOLN: 4.0000 mg | Freq: Four times a day (QID) | INTRAMUSCULAR | Status: DC | PRN

## 2015-08-27 MED ORDER — HYDRALAZINE HCL 20 MG/ML IJ SOLN: 10.0000 mg | INTRAMUSCULAR | Status: DC | PRN

## 2015-08-27 MED ORDER — OXYCODONE-ACETAMINOPHEN 5-325 MG PO TABS: 1.0000 | ORAL_TABLET | ORAL | Status: DC | PRN

## 2015-08-27 MED ORDER — LIDOCAINE HCL 1 % IJ SOLN
INTRAMUSCULAR | Status: DC | PRN
  Administered 2015-08-27: 1 mL via INTRAMUSCULAR

## 2015-08-27 MED ORDER — HYDROMORPHONE HCL 1 MG/ML IJ SOLN: 1.0000 mg | INTRAMUSCULAR | Status: DC | PRN

## 2015-08-27 MED ORDER — METHOCARBAMOL 500 MG PO TABS
500.0000 mg | ORAL_TABLET | Freq: Four times a day (QID) | ORAL | Status: DC | PRN
  Administered 2015-08-30 – 2015-08-31 (×2): 500 mg via ORAL
  Filled 2015-08-27 (×2): qty 1

## 2015-08-27 MED ORDER — SODIUM CHLORIDE 0.9% FLUSH: 9.0000 mL | INTRAVENOUS | Status: DC | PRN

## 2015-08-27 MED ORDER — ONDANSETRON 4 MG PO TBDP: 4.0000 mg | ORAL_TABLET | Freq: Four times a day (QID) | ORAL | Status: DC | PRN

## 2015-08-27 MED ORDER — ROCURONIUM BROMIDE 100 MG/10ML IV SOLN
INTRAVENOUS | Status: DC | PRN
  Administered 2015-08-27: 10 mg via INTRAVENOUS
  Administered 2015-08-27: 50 mg via INTRAVENOUS
  Administered 2015-08-27 (×5): 10 mg via INTRAVENOUS

## 2015-08-27 MED ORDER — FENTANYL CITRATE (PF) 250 MCG/5ML IJ SOLN
INTRAMUSCULAR | Status: AC
  Filled 2015-08-27: qty 5

## 2015-08-27 MED ORDER — HYDROMORPHONE 1 MG/ML IV SOLN
INTRAVENOUS | Status: AC
  Administered 2015-08-27: 1.2 mg
  Filled 2015-08-27: qty 25

## 2015-08-27 MED ORDER — DEXTROSE 5 % IV SOLN
10.0000 mg | INTRAVENOUS | Status: DC | PRN
  Administered 2015-08-27: 40 ug/min via INTRAVENOUS

## 2015-08-27 MED ORDER — DIPHENHYDRAMINE HCL 12.5 MG/5ML PO ELIX
12.5000 mg | ORAL_SOLUTION | Freq: Four times a day (QID) | ORAL | Status: DC | PRN
  Filled 2015-08-27: qty 5

## 2015-08-27 MED ORDER — CHLORHEXIDINE GLUCONATE 4 % EX LIQD: 1.0000 "application " | Freq: Once | CUTANEOUS | Status: DC

## 2015-08-27 MED ORDER — OXYCODONE HCL 5 MG PO TABS
5.0000 mg | ORAL_TABLET | ORAL | Status: DC | PRN
  Administered 2015-08-30 – 2015-08-31 (×5): 10 mg via ORAL
  Filled 2015-08-27 (×5): qty 2

## 2015-08-27 MED ORDER — CEFAZOLIN SODIUM-DEXTROSE 2-4 GM/100ML-% IV SOLN
2.0000 g | Freq: Three times a day (TID) | INTRAVENOUS | Status: AC
  Administered 2015-08-27: 2 g via INTRAVENOUS
  Filled 2015-08-27 (×2): qty 100

## 2015-08-27 MED ORDER — PHENYLEPHRINE HCL 10 MG/ML IJ SOLN
INTRAMUSCULAR | Status: DC | PRN
  Administered 2015-08-27 (×2): 80 ug via INTRAVENOUS

## 2015-08-27 MED ORDER — HYDROMORPHONE HCL 1 MG/ML IJ SOLN
INTRAMUSCULAR | Status: AC
  Administered 2015-08-27: 0.5 mg via INTRAVENOUS
  Filled 2015-08-27: qty 2

## 2015-08-27 MED ORDER — LISINOPRIL 20 MG PO TABS
20.0000 mg | ORAL_TABLET | Freq: Every day | ORAL | Status: DC
Start: 2015-08-28 — End: 2015-09-01
  Administered 2015-08-28 – 2015-09-01 (×5): 20 mg via ORAL
  Filled 2015-08-27 (×5): qty 1

## 2015-08-27 MED ORDER — SODIUM CHLORIDE 0.9 % IR SOLN
Status: DC | PRN
  Administered 2015-08-27: 2000 mL
  Administered 2015-08-27: 1000 mL

## 2015-08-27 MED ORDER — 0.9 % SODIUM CHLORIDE (POUR BTL) OPTIME
TOPICAL | Status: DC | PRN
  Administered 2015-08-27 (×2): 1000 mL

## 2015-08-27 MED ORDER — BUPIVACAINE 0.25 % ON-Q PUMP SINGLE CATH 300ML
300.0000 mL | INJECTION | Status: DC
  Filled 2015-08-27 (×2): qty 300

## 2015-08-27 MED ORDER — INSULIN ASPART 100 UNIT/ML ~~LOC~~ SOLN
0.0000 [IU] | Freq: Three times a day (TID) | SUBCUTANEOUS | Status: DC
  Administered 2015-08-27 – 2015-08-28 (×3): 2 [IU] via SUBCUTANEOUS
  Administered 2015-08-28: 1 [IU] via SUBCUTANEOUS
  Administered 2015-08-29 (×2): 2 [IU] via SUBCUTANEOUS
  Administered 2015-08-30 – 2015-08-31 (×3): 1 [IU] via SUBCUTANEOUS

## 2015-08-27 MED ORDER — ONDANSETRON HCL 4 MG/2ML IJ SOLN
INTRAMUSCULAR | Status: AC
  Filled 2015-08-27: qty 2

## 2015-08-27 MED ORDER — SODIUM CHLORIDE 0.9% FLUSH
3.0000 mL | Freq: Two times a day (BID) | INTRAVENOUS | Status: DC
  Administered 2015-08-27 – 2015-09-01 (×5): 3 mL via INTRAVENOUS

## 2015-08-27 MED ORDER — OXYCODONE HCL 5 MG PO TABS
5.0000 mg | ORAL_TABLET | Freq: Once | ORAL | Status: DC | PRN
Start: 2015-08-27 — End: 2015-08-27

## 2015-08-27 MED ORDER — KCL IN DEXTROSE-NACL 20-5-0.45 MEQ/L-%-% IV SOLN
INTRAVENOUS | Status: AC
  Filled 2015-08-27: qty 1000

## 2015-08-27 MED ORDER — ZOLPIDEM TARTRATE 5 MG PO TABS: 5.0000 mg | ORAL_TABLET | Freq: Every evening | ORAL | Status: DC | PRN

## 2015-08-27 MED ORDER — FAMOTIDINE IN NACL 20-0.9 MG/50ML-% IV SOLN
20.0000 mg | Freq: Two times a day (BID) | INTRAVENOUS | Status: DC
  Administered 2015-08-27 – 2015-09-01 (×9): 20 mg via INTRAVENOUS
  Filled 2015-08-27 (×11): qty 50

## 2015-08-27 SURGICAL SUPPLY — 108 items
ADAPTER CATH SYR TO TUBING 38M (ADAPTER) ×4 IMPLANT
ADPR CATH LL SYR 3/32 TPR (ADAPTER) ×1
BAG BILE T-TUBES STRL (MISCELLANEOUS) ×4 IMPLANT
BIOPATCH RED 1 DISK 7.0 (GAUZE/BANDAGES/DRESSINGS) ×4 IMPLANT
BLADE SURG 10 STRL SS (BLADE) ×4 IMPLANT
BLADE SURG ROTATE 9660 (MISCELLANEOUS) IMPLANT
CANISTER SUCTION 2500CC (MISCELLANEOUS) ×4 IMPLANT
CATH KIT ON Q 7.5IN SLV (PAIN MANAGEMENT) ×8 IMPLANT
CHLORAPREP W/TINT 26ML (MISCELLANEOUS) ×8 IMPLANT
CLIP LIGATING HEM O LOK PURPLE (MISCELLANEOUS) ×4 IMPLANT
CLIP LIGATING HEMO O LOK GREEN (MISCELLANEOUS) ×4 IMPLANT
CLIP LIGATING HEMOLOK MED (MISCELLANEOUS) ×4 IMPLANT
CLIP TI LARGE 6 (CLIP) ×4 IMPLANT
CLIP TI MEDIUM 6 (CLIP) ×4 IMPLANT
CONT SPEC STER OR (MISCELLANEOUS) ×4 IMPLANT
COVER SURGICAL LIGHT HANDLE (MISCELLANEOUS) ×8 IMPLANT
DECANTER SPIKE VIAL GLASS SM (MISCELLANEOUS) ×8 IMPLANT
DERMABOND ADVANCED (GAUZE/BANDAGES/DRESSINGS) ×2
DERMABOND ADVANCED .7 DNX12 (GAUZE/BANDAGES/DRESSINGS) ×6 IMPLANT
DRAIN PENROSE 1/2X12 LTX STRL (WOUND CARE) IMPLANT
DRAIN WOUND SNY 15 RND (WOUND CARE) ×4 IMPLANT
DRAPE LAPAROSCOPIC ABDOMINAL (DRAPES) ×4 IMPLANT
DRAPE LAPAROTOMY TRNSV 102X78 (DRAPE) ×4 IMPLANT
DRAPE UTILITY XL STRL (DRAPES) ×4 IMPLANT
DRAPE WARM FLUID 44X44 (DRAPE) ×4 IMPLANT
DRSG OPSITE POSTOP 4X10 (GAUZE/BANDAGES/DRESSINGS) ×4 IMPLANT
DRSG TEGADERM 4X4.75 (GAUZE/BANDAGES/DRESSINGS) ×4 IMPLANT
ELECT BLADE 6.5 EXT (BLADE) ×4 IMPLANT
ELECT CAUTERY BLADE 6.4 (BLADE) ×4 IMPLANT
ELECT LOOP CUT MONO 26F .012 R (MISCELLANEOUS) ×4 IMPLANT
ELECT REM PT RETURN 9FT ADLT (ELECTROSURGICAL) ×8
ELECTRODE REM PT RTRN 9FT ADLT (ELECTROSURGICAL) ×6 IMPLANT
EVACUATOR SILICONE 100CC (DRAIN) ×4 IMPLANT
GAUZE SPONGE 4X4 16PLY XRAY LF (GAUZE/BANDAGES/DRESSINGS) ×8 IMPLANT
GLOVE BIO SURGEON STRL SZ 6 (GLOVE) ×8 IMPLANT
GLOVE BIO SURGEON STRL SZ7 (GLOVE) ×8 IMPLANT
GLOVE BIO SURGEON STRL SZ7.5 (GLOVE) ×8 IMPLANT
GLOVE BIOGEL PI IND STRL 6.5 (GLOVE) ×6 IMPLANT
GLOVE BIOGEL PI IND STRL 7.0 (GLOVE) ×15 IMPLANT
GLOVE BIOGEL PI IND STRL 7.5 (GLOVE) ×3 IMPLANT
GLOVE BIOGEL PI IND STRL 8 (GLOVE) ×6 IMPLANT
GLOVE BIOGEL PI INDICATOR 6.5 (GLOVE) ×2
GLOVE BIOGEL PI INDICATOR 7.0 (GLOVE) ×5
GLOVE BIOGEL PI INDICATOR 7.5 (GLOVE) ×1
GLOVE BIOGEL PI INDICATOR 8 (GLOVE) ×2
GLOVE ECLIPSE 7.5 STRL STRAW (GLOVE) ×4 IMPLANT
GLOVE SURG SS PI 7.0 STRL IVOR (GLOVE) ×12 IMPLANT
GLOVE SURG SS PI 8.0 STRL IVOR (GLOVE) ×12 IMPLANT
GOWN STRL REUS W/ TWL LRG LVL3 (GOWN DISPOSABLE) ×18 IMPLANT
GOWN STRL REUS W/ TWL XL LVL3 (GOWN DISPOSABLE) ×6 IMPLANT
GOWN STRL REUS W/TWL 2XL LVL3 (GOWN DISPOSABLE) ×12 IMPLANT
GOWN STRL REUS W/TWL LRG LVL3 (GOWN DISPOSABLE) ×6
GOWN STRL REUS W/TWL XL LVL3 (GOWN DISPOSABLE) ×4
KIT BASIN OR (CUSTOM PROCEDURE TRAY) ×8 IMPLANT
KIT MARKER MARGIN INK (KITS) ×4 IMPLANT
KIT ROOM TURNOVER OR (KITS) ×8 IMPLANT
LIQUID BAND (GAUZE/BANDAGES/DRESSINGS) ×8 IMPLANT
MESH PARIETEX PROGRIP RIGHT (Mesh General) ×4 IMPLANT
NEEDLE HYPO 25GX1X1/2 BEV (NEEDLE) ×4 IMPLANT
NS IRRIG 1000ML POUR BTL (IV SOLUTION) ×8 IMPLANT
PACK SURGICAL SETUP 50X90 (CUSTOM PROCEDURE TRAY) ×4 IMPLANT
PAD ARMBOARD 7.5X6 YLW CONV (MISCELLANEOUS) ×16 IMPLANT
PENCIL BUTTON HOLSTER BLD 10FT (ELECTRODE) ×8 IMPLANT
RELOAD STAPLER LINE PROX 30 GR (STAPLE) ×3 IMPLANT
SCISSORS LAP 5X35 DISP (ENDOMECHANICALS) IMPLANT
SET IRRIG TUBING LAPAROSCOPIC (IRRIGATION / IRRIGATOR) IMPLANT
SHEARS FOC LG CVD HARMONIC 17C (MISCELLANEOUS) ×4 IMPLANT
SLEEVE ENDOPATH XCEL 5M (ENDOMECHANICALS) ×4 IMPLANT
SPECIMEN JAR SMALL (MISCELLANEOUS) ×4 IMPLANT
SPONGE INTESTINAL PEANUT (DISPOSABLE) IMPLANT
SPONGE LAP 18X18 X RAY DECT (DISPOSABLE) ×24 IMPLANT
STAPLER RELOAD LINE PROX 30 GR (STAPLE) ×4
STAPLER VISISTAT 35W (STAPLE) ×4 IMPLANT
SUT ETHILON 2 0 FS 18 (SUTURE) ×4 IMPLANT
SUT MNCRL AB 4-0 PS2 18 (SUTURE) ×8 IMPLANT
SUT PDS AB 0 CT 36 (SUTURE) IMPLANT
SUT PDS AB 1 TP1 96 (SUTURE) ×8 IMPLANT
SUT PROLENE 2 0 SH DA (SUTURE) ×4 IMPLANT
SUT PROLENE 4 0 RB 1 (SUTURE) ×6
SUT PROLENE 4-0 RB1 .5 CRCL 36 (SUTURE) ×9 IMPLANT
SUT SILK 2 0 SH CR/8 (SUTURE) ×4 IMPLANT
SUT SILK 2 0 TIES 10X30 (SUTURE) ×4 IMPLANT
SUT SILK 3 0 SH CR/8 (SUTURE) ×4 IMPLANT
SUT SILK 3 0 TIES 10X30 (SUTURE) ×4 IMPLANT
SUT VIC AB 0 CT2 27 (SUTURE) IMPLANT
SUT VIC AB 2-0 SH 27 (SUTURE) ×2
SUT VIC AB 2-0 SH 27X BRD (SUTURE) ×3 IMPLANT
SUT VIC AB 2-0 SH 27XBRD (SUTURE) ×3 IMPLANT
SUT VIC AB 3-0 SH 27 (SUTURE) ×2
SUT VIC AB 3-0 SH 27X BRD (SUTURE) ×3 IMPLANT
SUT VIC AB 3-0 SH 27XBRD (SUTURE) ×3 IMPLANT
SUT VICRYL AB 2 0 TIES (SUTURE) ×4 IMPLANT
SYR BULB 3OZ (MISCELLANEOUS) ×4 IMPLANT
SYR BULB IRRIGATION 50ML (SYRINGE) ×4 IMPLANT
SYR CONTROL 10ML LL (SYRINGE) ×4 IMPLANT
SYRINGE TOOMEY DISP (SYRINGE) ×4 IMPLANT
TOWEL OR 17X24 6PK STRL BLUE (TOWEL DISPOSABLE) ×8 IMPLANT
TOWEL OR 17X26 10 PK STRL BLUE (TOWEL DISPOSABLE) ×8 IMPLANT
TRAY FOLEY CATH 16FR SILVER (SET/KITS/TRAYS/PACK) ×4 IMPLANT
TRAY LAPAROSCOPIC MC (CUSTOM PROCEDURE TRAY) ×4 IMPLANT
TROCAR XCEL BLUNT TIP 100MML (ENDOMECHANICALS) IMPLANT
TROCAR XCEL NON-BLD 11X100MML (ENDOMECHANICALS) ×4 IMPLANT
TROCAR XCEL NON-BLD 5MMX100MML (ENDOMECHANICALS) ×4 IMPLANT
TUBE CONNECTING 12X1/4 (SUCTIONS) IMPLANT
TUBING INSUFFLATION (TUBING) ×4 IMPLANT
TUNNELER SHEATH ON-Q 16GX12 DP (PAIN MANAGEMENT) ×4 IMPLANT
WATER STERILE IRR 1000ML POUR (IV SOLUTION) ×4 IMPLANT
YANKAUER SUCT BULB TIP NO VENT (SUCTIONS) IMPLANT

## 2015-08-27 NOTE — Transfer of Care (Signed)
Immediate Anesthesia Transfer of Care Note  Patient: Perry Robinson  Procedure(s) Performed: Procedure(s): LAPAROSCOPY DIAGNOSTIC (N/A) POSSIBLE OPEN DISTAL PANCREATECTOMY AND SPLENECTOMY (N/A) OPEN RIGHT INGUINAL HERNIA  (Right) INSERTION OF MESH (Right)  Patient Location: PACU  Anesthesia Type:General  Level of Consciousness: awake, alert , oriented and patient cooperative  Airway & Oxygen Therapy: Patient Spontanous Breathing and Patient connected to nasal cannula oxygen  Post-op Assessment: Report given to RN, Post -op Vital signs reviewed and stable and Patient moving all extremities X 4  Post vital signs: Reviewed and stable  Last Vitals:  Filed Vitals:   08/27/15 0713 08/27/15 1415  BP: 138/78   Pulse: 77   Temp: 36.7 C 36.9 C  Resp: 18     Last Pain: There were no vitals filed for this visit.    Patients Stated Pain Goal: 5 ([id number])  Complications: No apparent anesthesia complications

## 2015-08-27 NOTE — Discharge Instructions (Signed)
[facility]      [facility] °[phone] ° °ABDOMINAL SURGERY: POST OP INSTRUCTIONS ° °Always review your discharge instruction sheet given to you by the facility where your surgery was performed. ° °IF YOU HAVE DISABILITY OR FAMILY LEAVE FORMS, YOU MUST BRING THEM TO THE OFFICE FOR PROCESSING.  PLEASE DO NOT GIVE THEM TO YOUR DOCTOR. ° °1. A prescription for pain medication may be given to you upon discharge.  Take your pain medication as prescribed, if needed.  If narcotic pain medicine is not needed, then you may take acetaminophen (Tylenol) or ibuprofen (Advil) as needed. °2. Take your usually prescribed medications unless otherwise directed. °3. If you need a refill on your pain medication, please contact your pharmacy. They will contact our office to request authorization.  Prescriptions will not be filled after 5pm or on week-ends. °4. You should follow a light diet the first few days after arrival home, such as soup and crackers, pudding, etc.unless your doctor has advised otherwise. A high-fiber, low fat diet can be resumed as tolerated.   Be sure to include lots of fluids daily. Most patients will experience some swelling and bruising on the chest and neck area.  Ice packs will help.  Swelling and bruising can take several days to resolve °5. Most patients will experience some swelling and bruising in the area of the incision. Ice pack will help. Swelling and bruising can take several days to resolve..  °6. It is common to experience some constipation if taking pain medication after surgery.  Increasing fluid intake and taking a stool softener will usually help or prevent this problem from occurring.  A mild laxative (Milk of Magnesia or Miralax) should be taken according to package directions if there are no bowel movements after 48 hours. °7.  You may have steri-strips (small skin tapes) in place directly over the incision.  These strips should be left on the skin for 10-14 days.  If your  surgeon used skin glue on the incision, you may shower in 48 hours.  The glue will flake off over the next 2-3 weeks.  Any sutures or staples will be removed at the office during your follow-up visit. You may find that a light gauze bandage over your incision may keep your staples from being rubbed or pulled. You may shower and replace the bandage daily. °8. ACTIVITIES:  You may resume regular (light) daily activities beginning the next day--such as daily self-care, walking, climbing stairs--gradually increasing activities as tolerated.  You may have sexual intercourse when it is comfortable.  Refrain from any heavy lifting or straining until approved by your doctor. °a. You may drive when you no longer are taking prescription pain medication, you can comfortably wear a seatbelt, and you can safely maneuver your car and apply brakes °b. Return to Work: __________8 weeks if applicable_________________________ °9. You should see your doctor in the office for a follow-up appointment approximately two weeks after your surgery.  Make sure that you call for this appointment within a day or two after you arrive home to insure a convenient appointment time. °OTHER INSTRUCTIONS:  °_____________________________________________________________ °_____________________________________________________________ ° °WHEN TO CALL YOUR DOCTOR: °1. Fever over 101.0 °2. Inability to urinate °3. Nausea and/or vomiting °4. Extreme swelling or bruising °5. Continued bleeding from incision. °6. Increased pain, redness, or drainage from the incision. °7. Difficulty swallowing or breathing °8. Muscle cramping or spasms. °9. Numbness or tingling in hands or feet or around lips. ° °The clinic staff is   available to answer your questions during regular business hours.  Please don’t hesitate to call and ask to speak to one of the nurses if you have concerns. ° °For further questions, please visit [email or web address] ° ° ° °

## 2015-08-27 NOTE — Anesthesia Preprocedure Evaluation (Signed)
Anesthesia Evaluation  Patient identified by MRN, date of birth, ID band Patient awake    Reviewed: Allergy & Precautions, NPO status , Patient's Chart, lab work & pertinent test results  Airway Mallampati: II       Dental  (+) Teeth Intact   Pulmonary Current Smoker,    breath sounds clear to auscultation       Cardiovascular hypertension,  Rhythm:Regular Rate:Normal     Neuro/Psych    GI/Hepatic GERD  ,Adeno of pancreas   Endo/Other    Renal/GU      Musculoskeletal   Abdominal   Peds  Hematology   Anesthesia Other Findings   Reproductive/Obstetrics                             Anesthesia Physical Anesthesia Plan  ASA: II  Anesthesia Plan: General   Post-op Pain Management:    Induction: Intravenous  Airway Management Planned: Oral ETT  Additional Equipment:   Intra-op Plan:   Post-operative Plan: Extubation in OR  Informed Consent: I have reviewed the patients History and Physical, chart, labs and discussed the procedure including the risks, benefits and alternatives for the proposed anesthesia with the patient or authorized representative who has indicated his/her understanding and acceptance.     Plan Discussed with:   Anesthesia Plan Comments:         Anesthesia Quick Evaluation

## 2015-08-27 NOTE — Op Note (Signed)
08/27/2015  11:04 AM  PATIENT:  Perry Robinson  65 y.o. male  PRE-OPERATIVE DIAGNOSIS:  RIGHT INGUINAL HERNIA  POST-OPERATIVE DIAGNOSIS: RIGHT INDIRECT  INGUINAL HERNIA  PROCEDURE:  Procedure(s): OPEN RIGHT INGUINAL HERNIA  (Right) INSERTION OF MESH (Right)  SURGEON:  Surgeon(s) and Role: * Ralene Ok, MD - Primary  ANESTHESIA:   local and general  EBL: <5cc Total I/O In: -  Out: 150 [Urine:150]  BLOOD ADMINISTERED:none  DRAINS: none   LOCAL MEDICATIONS USED:  BUPIVICAINE   SPECIMEN:  Source of Specimen:  Right hernia sac  DISPOSITION OF SPECIMEN:  PATHOLOGY  COUNTS:  YES  TOURNIQUET:  * No tourniquets in log *  DICTATION: .Dragon Dictation Details of the procedure: The patient was taken back to the operating room. The patient was placed in supine position with bilateral SCDs in place. The patient was prepped and draped in the usual sterile fashion.  After appropriate anitbiotics were confirmed, a time-out was confirmed and all facts were verified.  Quarter percent Marcaine was used to infiltrate the area of the incision and an ilioinguinal nerve block was also placed.   A 5 cm incision was made just 1 cm superior to the inguinal ligament. Bovie cautery was used to maintain hemostasis dissection is carried down to the external oblique.  A standard incision was made laterally, and the external oblique was bluntly dissected away from the surrounding tissue with Metzenbaum scissors. There was a large amount of scar tissue.The external oblique was elevated in the spermatic cord was bluntly dissected away from the surrounding tissue.  The ilioinguinal nerve was identified and ligated with an 2-0 dyed vicryl.   The spermatic cord and the hernia were then bluntly dissected away from the pubic tubercle and a Penrose was placed around the hernia sac in the spermatic cord. The vas deferens was identified and protected at all portions of the case. Dissection of the  cremasterics took place with Bovie cautery. One the hernia sac was dissected away from the surrrounding cremesteric tissue , the hernia sac was entered laterally. There was small bowel at the base, there were no sliding components and no femoral hernia palpated. The hernia sac was highly ligated using 0 Vicryl.  The specimen was sent to pathology. This retracted into the abdomen in the usual fashion.  At this time a left sided Progrip mesh was then anchored to the pubic tubercle with a 2-0 Prolene.  It was anchored to the shelving edge of the external oblique x 1 and the conjoint tendon cephalad x 1.  The wrap around of the mesh was sutured to the conjoint tendon as well.  The new internal ring did not strangulate the spermatic cord.   The tail was then tucked under the external oblique. At this time the area was irrigated out with sterile saline.    The external oblique was reapproximated using a 2-0 Vicryl in a running fashion. Scarpa's fascia was then reapproximated using a 3-0 Vicryl running fashion. The skin was then reapproximated with 4 Monocryl in a subcuticular fashion. The skin was then dressed with Dermabond.  The patient was taken to the recovery room in stable condition.  PLAN OF CARE: Discharge to home after PACU  PATIENT DISPOSITION:  PACU - hemodynamically stable.   Delay start of Pharmacological VTE agent (>24hrs) due to surgical blood loss or risk of bleeding: not applicable

## 2015-08-27 NOTE — Anesthesia Postprocedure Evaluation (Signed)
Anesthesia Post Note  Patient: Perry Robinson  Procedure(s) Performed: Procedure(s) (LRB): LAPAROSCOPY DIAGNOSTIC (N/A) POSSIBLE OPEN DISTAL PANCREATECTOMY AND SPLENECTOMY (N/A) OPEN RIGHT INGUINAL HERNIA  (Right) INSERTION OF MESH (Right)  Patient location during evaluation: PACU Anesthesia Type: General Level of consciousness: awake and alert and patient cooperative Pain management: pain level controlled Vital Signs Assessment: post-procedure vital signs reviewed and stable Respiratory status: spontaneous breathing and respiratory function stable Cardiovascular status: stable Anesthetic complications: no    Last Vitals:  Filed Vitals:   08/27/15 1515 08/27/15 1604  BP: 127/82 124/74  Pulse: 88 92  Temp:  36.5 C  Resp: 10 16    Last Pain:  Filed Vitals:   08/27/15 1606  PainSc: [name]

## 2015-08-27 NOTE — H&P (Signed)
History of Present Illness Perry Ok MD; 08/02/2015 10:19 AM) The patient is a 65 year old male who presents with an inguinal hernia. The patient is a 65 year old male who presents today secondary to a right inguinal hernia. Patient has had a lengthy workup and was diagnosed with carcinoma of the pancreas. Patient is to undergo distal pancreatectomy and splenectomy by [name].  Patient states that the pain was intense this morning.   Allergies Elbert Ewings, CMA; 08/02/2015 10:06 AM) No Known Drug Allergies04/07/2015  Medication History Elbert Ewings, CMA; 08/02/2015 10:06 AM) Lisinopril (20MG  Tablet, Oral) Active. Medications Reconciled    Review of Systems Perry Ok, MD; 08/02/2015 10:20 AM) General Present- Weight Loss. Not Present- Appetite Loss, Chills, Fatigue, Fever, Night Sweats and Weight Gain. Skin Present- Dryness. Not Present- Change in Wart/Mole, Hives, Jaundice, New Lesions, Non-Healing Wounds, Rash and Ulcer. HEENT Present- Wears glasses/contact lenses. Not Present- Earache, Hearing Loss, Hoarseness, Nose Bleed, Oral Ulcers, Ringing in the Ears, Seasonal Allergies, Sinus Pain, Sore Throat, Visual Disturbances and Yellow Eyes. Respiratory Not Present- Bloody sputum, Chronic Cough, Difficulty Breathing, Snoring and Wheezing. Cardiovascular Not Present- Chest Pain, Difficulty Breathing Lying Down, Leg Cramps, Palpitations, Rapid Heart Rate, Shortness of Breath and Swelling of Extremities. Gastrointestinal Not Present- Abdominal Pain, Bloating, Bloody Stool, Change in Bowel Habits, Chronic diarrhea, Constipation, Difficulty Swallowing, Excessive gas, Gets full quickly at meals, Hemorrhoids, Indigestion, Nausea, Rectal Pain and Vomiting. Male Genitourinary Not Present- Blood in Urine, Change in Urinary Stream, Frequency, Impotence, Nocturia, Painful Urination, Urgency and Urine Leakage. Musculoskeletal Not Present- Myalgia. Neurological Not Present- Decreased Memory,  Fainting, Headaches, Numbness, Seizures, Tingling, Tremor, Trouble walking and Weakness. Psychiatric Not Present- Anxiety, Bipolar, Change in Sleep Pattern, Depression, Fearful and Frequent crying. Endocrine Not Present- Cold Intolerance, Excessive Hunger, Hair Changes, Heat Intolerance and New Diabetes. Hematology Not Present- Easy Bruising, Excessive bleeding, Gland problems, HIV and Persistent Infections.  BP 138/78 mmHg  Pulse 77  Temp(Src) 98 F (36.7 C) (Oral)  Resp 18  Ht 5' 8.5" (1.74 m)  Wt 72.031 kg (158 lb 12.8 oz)  BMI 23.79 kg/m2  SpO2 96%   Physical Exam Perry Ok MD; 08/02/2015 10:19 AM) General Mental Status-Alert. General Appearance-Consistent with stated age. Hydration-Well hydrated. Voice-Normal.  Head and Neck Head-normocephalic, atraumatic with no lesions or palpable masses. Trachea-midline.  Eye Eyeball - Bilateral-Extraocular movements intact. Sclera/Conjunctiva - Bilateral-No scleral icterus.  Chest and Lung Exam Chest and lung exam reveals -quiet, even and easy respiratory effort with no use of accessory muscles. Inspection Chest Wall - Normal. Back - normal.  Cardiovascular Cardiovascular examination reveals -normal heart sounds, regular rate and rhythm with no murmurs.  Abdomen Inspection Skin - Scar: Note: Lower midline incision/suprapubic. Hernias - Inguinal hernia - Right - Reducible. Palpation/Percussion Normal exam - Soft, Non Tender, No Rebound tenderness, No Rigidity (guarding) and No hepatosplenomegaly. Auscultation Normal exam - Bowel sounds normal.  Neurologic Neurologic evaluation reveals -alert and oriented x 3 with no impairment of recent or remote memory. Mental Status-Normal.  Musculoskeletal Normal Exam - Left-Upper Extremity Strength Normal and Lower Extremity Strength Normal. Normal Exam - Right-Upper Extremity Strength Normal, Lower Extremity Weakness.    Assessment & Plan Perry Ok MD; 08/02/2015 10:20 AM) RIGHT INGUINAL HERNIA (K40.90) Impression: 65 year old male with a right inguinal hernia.  1. The patient would like to proceed to the operating room for open right inguinal hernia repair with mesh.  2. I discussed with the patient the risks and benefits of the procedure to include but  not limited to: Infection, bleeding, damage to surrounding structures, possible need for further surgery, possible nerve pain, and possible recurrence. The patient was understanding and wishes to proceed.

## 2015-08-27 NOTE — H&P (Addendum)
Perry Robinson  Location: [facility] Surgery Patient #: [id number] DOB: 09-Mar-1950 Married / Language: English / Race: Black or African American Male   History of Present Illness The patient is a 65 year old male who presents with pancreatic cancer. Patient is a 65 year old male who is referred for consultation at the request of Dr. Benay Spice for a new diagnosis of pancreatic cancer. The patient presented with an incidentally detected pancreatic body mass. He has history of prostate cancer and was undergoing serial staging scans. A new mass was found. He subsequently underwent endoscopic ultrasound and was found to have a 1.7 cm pancreatic adenocarcinoma. The patient denies abdominal pain. He has not had any significant weight change recently. He has no diarrhea or problems with his blood sugar that he is aware of.  He does have a personal history of prostate cancer and a brother with prostate cancer.  CBC WBCs 3.3 CMET nl CA 19-9 94  All images and reports were reviewed. CT chest IMPRESSION: 1.6 cm hypoenhancing mass in the pancreatic body/tail, corresponding to known pancreatic adenocarcinoma, better evaluated on recent prior CT abdomen/pelvis.  No evidence of metastatic disease in the chest.  Additional ancillary findings as above.   CT abdomen pelvis New lesion in the body of the pancreas is highly concerning for pancreatic adenocarcinoma. Recommend endoscopic ultrasound with tissue sampling. small gastrohepatic lymph nodes unchanged from prior, likely benign. Pancreatic lesion appears clear from the celiac trunk. Questionable involvement of the SMA. No evidence of prostate cancer progression  Bone scan negative  EUS 1. An irregular mass was identified in the pancreatic body. The mass was hypoechoic. The mass measured 17 mm in maximal cross-sectional diameter and caused clear upstream dilation of the main pancreatic duct. The endosonographic borders were  well-defined and there was no clear sign of involvement of SMA, SMV, celiac trunk or portal vein. The remainder of the pancreas was examined. Fine needle aspiration for cytology was performed. 2. The main pancreatic duct was dilated up to 68mm, upstream from the mass described above. 3. The CBD was normal, non-dilated. 4. No peripancreatic adenopathy. 5. Gallbladder was normal. 6. Limited views of liver, spleen, portal vessels were all normal   Medication History Tawni Pummel, RN; 07/20/2015 8:14 AM) Medications Reconciled    Review of Systems Stark Klein MD; 07/20/2015 10:47 AM) General Not Present- Appetite Loss, Chills, Fatigue, Fever, Night Sweats, Weight Gain and Weight Loss. Skin Not Present- Change in Wart/Mole, Dryness, Hives, Jaundice, New Lesions, Non-Healing Wounds, Rash and Ulcer. HEENT Not Present- Earache, Hearing Loss, Hoarseness, Nose Bleed, Oral Ulcers, Ringing in the Ears, Seasonal Allergies, Sinus Pain, Sore Throat, Visual Disturbances, Wears glasses/contact lenses and Yellow Eyes. Respiratory Not Present- Bloody sputum, Chronic Cough, Difficulty Breathing, Snoring and Wheezing. Breast Not Present- Breast Mass, Breast Pain, Nipple Discharge and Skin Changes. Cardiovascular Not Present- Chest Pain, Difficulty Breathing Lying Down, Leg Cramps, Palpitations, Rapid Heart Rate, Shortness of Breath and Swelling of Extremities. Gastrointestinal Not Present- Abdominal Pain, Bloating, Bloody Stool, Change in Bowel Habits, Chronic diarrhea, Constipation, Difficulty Swallowing, Excessive gas, Gets full quickly at meals, Hemorrhoids, Indigestion, Nausea, Rectal Pain and Vomiting. Male Genitourinary Not Present- Blood in Urine, Change in Urinary Stream, Frequency, Impotence, Nocturia, Painful Urination, Urgency and Urine Leakage. Musculoskeletal Not Present- Back Pain, Joint Pain, Joint Stiffness, Muscle Pain, Muscle Weakness and Swelling of Extremities. Neurological Not Present-  Decreased Memory, Fainting, Headaches, Numbness, Seizures, Tingling, Tremor, Trouble walking and Weakness. Psychiatric Not Present- Anxiety, Bipolar, Change in Sleep Pattern, Depression, Fearful  and Frequent crying. Endocrine Not Present- Cold Intolerance, Excessive Hunger, Hair Changes, Heat Intolerance and New Diabetes. Hematology Not Present- Easy Bruising, Excessive bleeding, Gland problems, HIV and Persistent Infections.  Vitals Stark Klein MD; 07/20/2015 11:27 AM) 07/20/2015 11:26 AM Weight: 155.2 lb Height: 68in Body Surface Area: 1.84 m Body Mass Index: 23.6 kg/m  Temp.: 98.70F  Pulse: 84 (Regular)  Resp.: 18 (Unlabored)  P.OX: 100% (Room air) BP: 140/68 (Sitting, Left Arm, Standard)       Physical Exam Stark Klein MD; 07/20/2015 11:26 AM) General Mental Status-Alert. General Appearance-Consistent with stated age. Hydration-Well hydrated. Voice-Normal.  Head and Neck Head-normocephalic, atraumatic with no lesions or palpable masses. Trachea-midline. Thyroid Gland Characteristics - normal size and consistency.  Eye Eyeball - Bilateral-Extraocular movements intact. Sclera/Conjunctiva - Bilateral-No scleral icterus.  Chest and Lung Exam Chest and lung exam reveals -quiet, even and easy respiratory effort with no use of accessory muscles and on auscultation, normal breath sounds, no adventitious sounds and normal vocal resonance. Inspection Chest Wall - Normal. Back - normal.  Cardiovascular Cardiovascular examination reveals -normal heart sounds, regular rate and rhythm with no murmurs and normal pedal pulses bilaterally.  Abdomen Inspection Inspection of the abdomen reveals - No Hernias. Palpation/Percussion Palpation and Percussion of the abdomen reveal - Soft, Non Tender, No Rebound tenderness, No Rigidity (guarding) and No hepatosplenomegaly. Auscultation Auscultation of the abdomen reveals - Bowel sounds  normal.  Neurologic Neurologic evaluation reveals -alert and oriented x 3 with no impairment of recent or remote memory. Mental Status-Normal.  Musculoskeletal Global Assessment -Note: no gross deformities.  Normal Exam - Left-Upper Extremity Strength Normal and Lower Extremity Strength Normal. Normal Exam - Right-Upper Extremity Strength Normal and Lower Extremity Strength Normal.  Lymphatic Head & Neck  General Head & Neck Lymphatics: Bilateral - Description - Normal. Axillary  General Axillary Region: Bilateral - Description - Normal. Tenderness - Non Tender. Femoral & Inguinal  Generalized Femoral & Inguinal Lymphatics: Bilateral - Description - No Generalized lymphadenopathy.    Assessment & Plan Stark Klein MD; 07/20/2015 11:43 AM) CARCINOMA OF BODY OF PANCREAS (C25.1) Impression: Given that this patient's cancer was discovered incidentally, I'm hopeful that he may not have metastatic disease and may actually have possible stage I disease. I will plan to do an upfront resection on him. I still would do a diagnostic laparoscopy to evaluate for metastatic disease. As long as that is negative, I would do an open distal pancreatectomy and stomach to me. I would not plan to do this in a minimally invasive fashion given the proximity of the mass to the central vessels.  He will get post splenectomy vaccines.  I also will refer him to genetics. The patient does not have any children, but he does have nieces and nephews that may benefit from knowing genetic testing results.  I discussed the surgery with the patient in terms of diagrams of anatomy. I also reviewed the time in the hospital as well as postop recovery. We discussed risks including bleeding, infection, damage to adjacent structures, possible pancreatic leak, possible need for additional procedures or surgeries, possible drain placement, possible prolonged hospitalization, possible blood clot, heart or lung  complications, possible death. I discussed that the patient may need additional treatment based on the final pathology.  We will do this at the first available opportunity.  60 min spent in evaluation, examination, counseling, and coordination of care. >50% spent in counseling. Current Plans You are being scheduled for surgery - Our schedulers will call you.  You should hear from our office's scheduling department within 5 working days about the location, date, and time of surgery. We try to make accommodations for patient's preferences in scheduling surgery, but sometimes the OR schedule or the surgeon's schedule prevents [place] from making those accommodations.  If you have not heard from our office [phone]) in 5 working days, call the office and ask for your surgeon's nurse.  If you have other questions about your diagnosis, plan, or surgery, call the office and ask for your surgeon's nurse.  Pt Education - FB pancreatectomy Referred to Genetic Counseling, for evaluation and follow up [employer]). Routine. RIGHT INGUINAL HERNIA (K40.90) Impression: Will see what Dr. Rosendo Gros thinks of concomitant repair.    Signed by Stark Klein, MD (07/20/2015 11:45 AM)

## 2015-08-27 NOTE — Op Note (Addendum)
PREOPERATIVE DIAGNOSIS:  cT1 N1 M0 pancreatic cancer.      POSTOPERATIVE DIAGNOSIS:  Same      PROCEDURE:  Diagnostic laparoscopy, open distal subtotal  pancreatectomy and splenectomy.      SURGEON:  Stark Klein, MD      ASSISTANT:  Frederich Cha, M.D.      ANESTHESIA:  General and local.      FINDINGS:  Firm mass in the pancreas overlying the SMA.      SPECIMEN:  Distal pancreas and spleen to Pathology. Additional pancreatic margin     ESTIMATED BLOOD LOSS:  50 mL      COMPLICATIONS:  None known.      PROCEDURE:  Patient was identified in the holding area and taken to   operating room where he was placed supine on the operating room table.   General anesthesia was induced.  Foley catheter was placed.  The abdomen was prepped and   draped in sterile fashion. Dr. Rosendo Gros performed an open right inguinal hernia.    Patient was reprepped and draped. Time-out was performed according to surgical   safety check list.  When all was correct, we continued.    The patient was placed into reverse trendelenburg position and rotated to the right.  A 5 mm Optiview trocar was placed at the costal margin after administration of local. Pneumoperitoneum was achieved.   No metastatic disease was seen.  A midline incision was made with the #10 blade.  The subcutaneous tissues were divided with cautery.       The lienocolic ligament was taken down with the cautery. The lesser sac was opened with the   cautery and all the adhesions were taken down.  Once the   stomach was completely off the spleen, the inferior border of the   pancreas was identified and this was opened up with the cautery.  The pancreatic tail was away from the hilum, so the splenic hilum was clamped and the splenic vessels ligated.  They vessels were tied off.  The pancreas and the splenic artery and vein were elevated.    The posterior attachments were then taken   off with the Harmonic scalpel. The pancreas was mobilized all the  way to the confluence of the splenic vein and portal vein.  The splenic artery was clamped, divided, and suture ligated with 2-0 silks.  The stay side was clipped.  The splenic vein was clamped, divided, and suture ligated with 4-0 prolene.  Also, this side was clipped.The pancreas was divided with the TA 30 stapler with a green load.  The staple line was run with 4-0 prolene.    The abdomen was copiously irrigated and there was   no sign of any additional bleeding.  The specimen was sent for frozen.  Frozen section of margin was negative.    Evicel was placed over the tail of  the pancreas.   The 71 Blake drain was then passed into the abdomen and pulled out the LUQ incision.      This was placed in the appropriate location.  The drain was secured with a 2-0 nylon.  The OnQ tunnelers were placed on either side of the fascial incision.  The fascia was run with #1 looped PDS suture. Skin was closed with staples.  The wound was cleaned, dried, and dressed with a island dressing.     The patient tolerated   the procedure well.  She was extubated and taken to the PACU in stable  condition.  Needle, sponge, and instrument counts were correct x2               Stark Klein, MD

## 2015-08-27 NOTE — Anesthesia Procedure Notes (Addendum)
Procedure Name: Intubation Date/Time: 08/27/2015 10:16 AM Performed by: Carney Living Pre-anesthesia Checklist: Patient identified, Emergency Drugs available, Suction available and Patient being monitored Patient Re-evaluated:Patient Re-evaluated prior to inductionOxygen Delivery Method: Circle system utilized Preoxygenation: Pre-oxygenation with 100% oxygen Intubation Type: IV induction Ventilation: Mask ventilation without difficulty and Oral airway inserted - appropriate to patient size Laryngoscope Size: Mac and 3 Grade View: Grade II Tube type: Oral Tube size: 7.5 mm Number of attempts: 1 Airway Equipment and Method: Stylet Placement Confirmation: ETT inserted through vocal cords under direct vision,  positive ETCO2 and breath sounds checked- equal and bilateral Secured at: 22 cm Tube secured with: Tape Dental Injury: Teeth and Oropharynx as per pre-operative assessment  Comments: Intubation performed by Laurie Panda, SRNA.  Small minor upper lip abrasion during intubation.

## 2015-08-28 ENCOUNTER — Encounter (HOSPITAL_COMMUNITY): Payer: Self-pay | Admitting: General Surgery

## 2015-08-28 LAB — CBC
HEMATOCRIT: 40.1 % (ref 39.0–52.0)
Hemoglobin: 12.6 g/dL — ABNORMAL LOW (ref 13.0–17.0)
MCH: 30.2 pg (ref 26.0–34.0)
MCHC: 31.4 g/dL (ref 30.0–36.0)
MCV: 96.2 fL (ref 78.0–100.0)
Platelets: 166 10*3/uL (ref 150–400)
RBC: 4.17 MIL/uL — ABNORMAL LOW (ref 4.22–5.81)
RDW: 14.7 % (ref 11.5–15.5)
WBC: 10.6 10*3/uL — ABNORMAL HIGH (ref 4.0–10.5)

## 2015-08-28 LAB — COMPREHENSIVE METABOLIC PANEL
ALK PHOS: 63 U/L (ref 38–126)
ALT: 35 U/L (ref 17–63)
ANION GAP: 7 (ref 5–15)
AST: 52 U/L — ABNORMAL HIGH (ref 15–41)
Albumin: 3.1 g/dL — ABNORMAL LOW (ref 3.5–5.0)
BILIRUBIN TOTAL: 0.7 mg/dL (ref 0.3–1.2)
BUN: 6 mg/dL (ref 6–20)
CALCIUM: 8.3 mg/dL — AB (ref 8.9–10.3)
CO2: 26 mmol/L (ref 22–32)
CREATININE: 0.74 mg/dL (ref 0.61–1.24)
Chloride: 106 mmol/L (ref 101–111)
GFR calc non Af Amer: >60 mL/min (ref >60)
GLUCOSE: 162 mg/dL — AB (ref 65–99)
Potassium: 4.2 mmol/L (ref 3.5–5.1)
Sodium: 139 mmol/L (ref 135–145)
TOTAL PROTEIN: 5.5 g/dL — AB (ref 6.5–8.1)

## 2015-08-28 LAB — MAGNESIUM: Magnesium: 2 mg/dL (ref 1.7–2.4)

## 2015-08-28 LAB — PROTIME-INR
INR: 1.28 (ref 0.00–1.49)
PROTHROMBIN TIME: 16.1 s — AB (ref 11.6–15.2)

## 2015-08-28 LAB — GLUCOSE, CAPILLARY
GLUCOSE-CAPILLARY: 138 mg/dL — AB (ref 65–99)
Glucose-Capillary: 190 mg/dL — ABNORMAL HIGH (ref 65–99)
Glucose-Capillary: 151 mg/dL — ABNORMAL HIGH (ref 65–99)
Glucose-Capillary: 165 mg/dL — ABNORMAL HIGH (ref 65–99)

## 2015-08-28 LAB — PHOSPHORUS: Phosphorus: 2.8 mg/dL (ref 2.5–4.6)

## 2015-08-28 MED ORDER — PHENOL 1.4 % MT LIQD
2.0000 | OROMUCOSAL | Status: DC | PRN
  Filled 2015-08-28: qty 177

## 2015-08-28 MED ORDER — CHLORHEXIDINE GLUCONATE 0.12 % MT SOLN
15.0000 mL | Freq: Two times a day (BID) | OROMUCOSAL | Status: DC
  Administered 2015-08-28 – 2015-08-31 (×6): 15 mL via OROMUCOSAL
  Filled 2015-08-28 (×5): qty 15

## 2015-08-28 MED ORDER — CETYLPYRIDINIUM CHLORIDE 0.05 % MT LIQD
7.0000 mL | Freq: Two times a day (BID) | OROMUCOSAL | Status: DC
  Administered 2015-08-28 – 2015-08-30 (×4): 7 mL via OROMUCOSAL

## 2015-08-28 NOTE — Progress Notes (Signed)
Pt sitting out in recliner at this moment. States that he felt dizzy when he first got up out of bed and is reluctant to go walking at this time

## 2015-08-28 NOTE — Progress Notes (Signed)
1 Day Post-Op  Subjective: C/o soreness in incisions.  NGT output acceptable. No n/v.  C/o throat pain.    Objective: Vital signs in last 24 hours: Temp:  [97.3 F (36.3 C)-98.6 F (37 C)] 98.6 F (37 C) (06/27 0451) Pulse Rate:  [71-92] 71 (06/27 0451) Resp:  [8-24] 19 (06/27 0753) BP: (110-128)/(63-82) 114/67 mmHg (06/27 0451) SpO2:  [93 %-100 %] 99 % (06/27 0753) Weight:  [77.3 kg (170 lb 6.7 oz)] 77.3 kg (170 lb 6.7 oz) (06/26 1604)    Intake/Output from previous day: 06/26 0701 - 06/27 0700 In: 5161.7 [I.V.:5161.7] Out: 2905 [Urine:1975; Emesis/NG output:150; Drains:730; Blood:50] Intake/Output this shift: Total I/O In: 0  Out: 400 [Urine:400]  General appearance: alert, cooperative and no distress Resp: breathing comfortably GI: soft, non distended, dressing c/d/i.  OnQ in place.  RIH incision OK Extremities: extremities normal, atraumatic, no cyanosis or edema  Lab Results:   Recent Labs  08/27/15 1619 08/28/15 0433  WBC 15.6* 10.6*  HGB 13.9 12.6*  HCT 42.2 40.1  PLT 176 166   BMET  Recent Labs  08/27/15 1619 08/28/15 0433  NA  --  139  K  --  4.2  CL  --  106  CO2  --  26  GLUCOSE  --  162*  BUN  --  6  CREATININE 0.80 0.74  CALCIUM  --  8.3*   PT/INR  Recent Labs  08/28/15 0433  LABPROT 16.1*  INR 1.28   ABG No results for input(s): PHART, HCO3 in the last 72 hours.  Invalid input(s): PCO2, PO2  Studies/Results: No results found.  Anti-infectives: Anti-infectives    Start     Dose/Rate Route Frequency Ordered Stop   08/27/15 1800  ceFAZolin (ANCEF) IVPB 2g/100 mL premix     2 g 200 mL/hr over 30 Minutes Intravenous Every 8 hours 08/27/15 1549 08/27/15 1914   08/27/15 0704  ceFAZolin (ANCEF) IVPB 2g/100 mL premix     2 g 200 mL/hr over 30 Minutes Intravenous On call to O.R. 08/27/15 0704 08/27/15 1018      Assessment/Plan: s/p Procedure(s): LAPAROSCOPY DIAGNOSTIC (N/A) POSSIBLE OPEN DISTAL PANCREATECTOMY AND SPLENECTOMY  (N/A) OPEN RIGHT INGUINAL HERNIA  (Right) INSERTION OF MESH (Right) d/c NGT  Clears Ambulate IS Await pathology   LOS: 1 day    [facility] 08/28/2015

## 2015-08-29 ENCOUNTER — Encounter (HOSPITAL_COMMUNITY): Payer: Self-pay | Admitting: General Practice

## 2015-08-29 ENCOUNTER — Telehealth: Payer: Self-pay | Admitting: Oncology

## 2015-08-29 LAB — CBC
HCT: 41 % (ref 39.0–52.0)
Hemoglobin: 13.1 g/dL (ref 13.0–17.0)
MCH: 30.8 pg (ref 26.0–34.0)
MCHC: 32 g/dL (ref 30.0–36.0)
MCV: 96.5 fL (ref 78.0–100.0)
PLATELETS: 179 10*3/uL (ref 150–400)
RBC: 4.25 MIL/uL (ref 4.22–5.81)
RDW: 14.4 % (ref 11.5–15.5)
WBC: 11.8 10*3/uL — ABNORMAL HIGH (ref 4.0–10.5)

## 2015-08-29 LAB — TYPE AND SCREEN
ABO/RH(D): B POS
Antibody Screen: NEGATIVE
UNIT DIVISION: 0
Unit division: 0

## 2015-08-29 LAB — COMPREHENSIVE METABOLIC PANEL
ALBUMIN: 3.2 g/dL — AB (ref 3.5–5.0)
ALT: 38 U/L (ref 17–63)
AST: 46 U/L — AB (ref 15–41)
Alkaline Phosphatase: 72 U/L (ref 38–126)
Anion gap: 7 (ref 5–15)
CHLORIDE: 104 mmol/L (ref 101–111)
CO2: 24 mmol/L (ref 22–32)
CREATININE: 0.68 mg/dL (ref 0.61–1.24)
Calcium: 8.7 mg/dL — ABNORMAL LOW (ref 8.9–10.3)
GFR calc Af Amer: >60 mL/min (ref >60)
GFR calc non Af Amer: >60 mL/min (ref >60)
GLUCOSE: 140 mg/dL — AB (ref 65–99)
POTASSIUM: 3.9 mmol/L (ref 3.5–5.1)
SODIUM: 135 mmol/L (ref 135–145)
Total Bilirubin: 0.9 mg/dL (ref 0.3–1.2)
Total Protein: 6.2 g/dL — ABNORMAL LOW (ref 6.5–8.1)

## 2015-08-29 LAB — GLUCOSE, CAPILLARY
GLUCOSE-CAPILLARY: 156 mg/dL — AB (ref 65–99)
Glucose-Capillary: 142 mg/dL — ABNORMAL HIGH (ref 65–99)
Glucose-Capillary: 156 mg/dL — ABNORMAL HIGH (ref 65–99)
Glucose-Capillary: 130 mg/dL — ABNORMAL HIGH (ref 65–99)

## 2015-08-29 NOTE — Progress Notes (Signed)
Patient ID: Perry Robinson, male   DOB: 11-06-1950, 65 y.o.   MRN: [id number] 2 Days Post-Op  Subjective: NGT removed yesterday.  No nausea, but having some indigestion.    Objective: Vital signs in last 24 hours: Temp:  [97.4 F (36.3 C)-98.6 F (37 C)] 97.4 F (36.3 C) (06/28 0600) Pulse Rate:  [76-83] 83 (06/28 0600) Resp:  [12-28] 16 (06/28 0800) BP: (114-142)/(66-83) 142/83 mmHg (06/28 0600) SpO2:  [96 %-100 %] 96 % (06/28 0800)    Intake/Output from previous day: 06/27 0701 - 06/28 0700 In: 1939 [P.O.:1020; I.V.:919] Out: 2225 [Urine:2075; Drains:150] Intake/Output this shift:    General appearance: alert, cooperative and no distress Resp: breathing comfortably GI: soft, mildly to moderately distended, dressing c/d/i.  OnQ in place.  RIH incision OK.  Drain serosang. Extremities: extremities normal, atraumatic, no cyanosis or edema  Lab Results:   Recent Labs  08/27/15 1619 08/28/15 0433  WBC 15.6* 10.6*  HGB 13.9 12.6*  HCT 42.2 40.1  PLT 176 166   BMET  Recent Labs  08/27/15 1619 08/28/15 0433  NA  --  139  K  --  4.2  CL  --  106  CO2  --  26  GLUCOSE  --  162*  BUN  --  6  CREATININE 0.80 0.74  CALCIUM  --  8.3*   PT/INR  Recent Labs  08/28/15 0433  LABPROT 16.1*  INR 1.28   ABG No results for input(s): PHART, HCO3 in the last 72 hours.  Invalid input(s): PCO2, PO2  Studies/Results: No results found.  Anti-infectives: Anti-infectives    Start     Dose/Rate Route Frequency Ordered Stop   08/27/15 1800  ceFAZolin (ANCEF) IVPB 2g/100 mL premix     2 g 200 mL/hr over 30 Minutes Intravenous Every 8 hours 08/27/15 1549 08/27/15 1914   08/27/15 0704  ceFAZolin (ANCEF) IVPB 2g/100 mL premix     2 g 200 mL/hr over 30 Minutes Intravenous On call to O.R. 08/27/15 0704 08/27/15 1018      Assessment/Plan: s/p Procedure(s): LAPAROSCOPY DIAGNOSTIC (N/A) POSSIBLE OPEN DISTAL PANCREATECTOMY AND SPLENECTOMY (N/A) OPEN RIGHT INGUINAL  HERNIA  (Right) INSERTION OF MESH (Right) Foley out this AM. Clears with sparing milk products.   Ambulate IS Pathology T1N1. Will need outpatient oncology and radiation referral.     LOS: 2 days    [facility] 08/29/2015

## 2015-08-29 NOTE — Progress Notes (Signed)
Patient asked RN to come into room because he was having some chest pains. Rn assessed patient at this time. Patient denied any pain radiating to his shoulders, arms, or back. Patient stated that this has happened before and it went away on it's own. Patient asked for some sprite to help his indigestion- at this time, patient burped and stated that the pain went away. Patient has been reassessed and denies having any pain. Will continue to monitor

## 2015-08-29 NOTE — Progress Notes (Signed)
2 Days Post-Op  Subjective: Pt doing well this AM.  Ambulating well.  Objective: Vital signs in last 24 hours: Temp:  [97.4 F (36.3 C)-98.6 F (37 C)] 97.4 F (36.3 C) (06/28 0600) Pulse Rate:  [76-83] 83 (06/28 0600) Resp:  [12-28] 20 (06/28 0600) BP: (114-142)/(66-83) 142/83 mmHg (06/28 0600) SpO2:  [97 %-100 %] 100 % (06/28 0600)    Intake/Output from previous day: 06/27 0701 - 06/28 0700 In: 1939 [P.O.:1020; I.V.:919] Out: 2225 [Urine:2075; Drains:150] Intake/Output this shift:    General appearance: alert and cooperative Cardio: regular rate and rhythm, S1, S2 normal, no murmur, click, rub or gallop and normal apical impulse GI: soft, approp ttp in midline, incisions c/d/i  Lab Results:   Recent Labs  08/27/15 1619 08/28/15 0433  WBC 15.6* 10.6*  HGB 13.9 12.6*  HCT 42.2 40.1  PLT 176 166   BMET  Recent Labs  08/27/15 1619 08/28/15 0433  NA  --  139  K  --  4.2  CL  --  106  CO2  --  26  GLUCOSE  --  162*  BUN  --  6  CREATININE 0.80 0.74  CALCIUM  --  8.3*   PT/INR  Recent Labs  08/28/15 0433  LABPROT 16.1*  INR 1.28    Anti-infectives: Anti-infectives    Start     Dose/Rate Route Frequency Ordered Stop   08/27/15 1800  ceFAZolin (ANCEF) IVPB 2g/100 mL premix     2 g 200 mL/hr over 30 Minutes Intravenous Every 8 hours 08/27/15 1549 08/27/15 1914   08/27/15 0704  ceFAZolin (ANCEF) IVPB 2g/100 mL premix     2 g 200 mL/hr over 30 Minutes Intravenous On call to O.R. 08/27/15 0704 08/27/15 1018      Assessment/Plan: s/p Procedure(s): LAPAROSCOPY DIAGNOSTIC (N/A) POSSIBLE OPEN DISTAL PANCREATECTOMY AND SPLENECTOMY (N/A) OPEN RIGHT INGUINAL HERNIA  (Right) INSERTION OF MESH (Right) RIHR incision looks great. Con't to ambulate as tolerated   LOS: 2 days    Rosario Jacks., Anne Hahn 08/29/2015

## 2015-08-29 NOTE — Telephone Encounter (Signed)
cld pt and left message of time * date of r/s appt-*adv 7/18@10 :39 MD appt

## 2015-08-29 NOTE — Progress Notes (Signed)
Patient has been tachy all day, running 21-30. However, patient comes back down to about 16-21 after he presses his button for pain medication. Will inform MD if HR sustains.

## 2015-08-30 LAB — CBC
HCT: 37.6 % — ABNORMAL LOW (ref 39.0–52.0)
HEMOGLOBIN: 12.4 g/dL — AB (ref 13.0–17.0)
MCH: 31.6 pg (ref 26.0–34.0)
MCHC: 33 g/dL (ref 30.0–36.0)
MCV: 95.7 fL (ref 78.0–100.0)
Platelets: 194 10*3/uL (ref 150–400)
RBC: 3.93 MIL/uL — AB (ref 4.22–5.81)
RDW: 13.6 % (ref 11.5–15.5)
WBC: 12.6 10*3/uL — AB (ref 4.0–10.5)

## 2015-08-30 LAB — GLUCOSE, CAPILLARY
GLUCOSE-CAPILLARY: 116 mg/dL — AB (ref 65–99)
Glucose-Capillary: 129 mg/dL — ABNORMAL HIGH (ref 65–99)
Glucose-Capillary: 139 mg/dL — ABNORMAL HIGH (ref 65–99)
Glucose-Capillary: 124 mg/dL — ABNORMAL HIGH (ref 65–99)

## 2015-08-30 LAB — COMPREHENSIVE METABOLIC PANEL
ALK PHOS: 60 U/L (ref 38–126)
ALT: 26 U/L (ref 17–63)
AST: 27 U/L (ref 15–41)
Albumin: 2.8 g/dL — ABNORMAL LOW (ref 3.5–5.0)
Anion gap: 8 (ref 5–15)
CALCIUM: 8.8 mg/dL — AB (ref 8.9–10.3)
CHLORIDE: 105 mmol/L (ref 101–111)
CO2: 23 mmol/L (ref 22–32)
CREATININE: 0.54 mg/dL — AB (ref 0.61–1.24)
GFR calc non Af Amer: >60 mL/min (ref >60)
Glucose, Bld: 127 mg/dL — ABNORMAL HIGH (ref 65–99)
Potassium: 3.7 mmol/L (ref 3.5–5.1)
SODIUM: 136 mmol/L (ref 135–145)
Total Bilirubin: 0.7 mg/dL (ref 0.3–1.2)
Total Protein: 6.1 g/dL — ABNORMAL LOW (ref 6.5–8.1)

## 2015-08-30 NOTE — Progress Notes (Signed)
Patient ID: Perry Robinson, male   DOB: [birth year], 65 y.o.   MRN: [id number] 3 Days Post-Op  Subjective: Doing better overnight.  Tolerated clears.  Heartburn resolved.  No n/v.  No stool or flatus.    Objective: Vital signs in last 24 hours: Temp:  [98.3 F (36.8 C)-99 F (37.2 C)] 99 F (37.2 C) (06/29 0455) Pulse Rate:  [83-93] 93 (06/29 0455) Resp:  [16-22] 19 (06/29 0455) BP: (136-150)/(72-77) 136/77 mmHg (06/29 1100) SpO2:  [93 %-97 %] 95 % (06/29 0455) FiO2 (%):  [93 %-96 %] 96 % (06/29 0401) Last BM Date: 08/27/15  Intake/Output from previous day: 06/28 0701 - 06/29 0700 In: 3000 [P.O.:600; I.V.:2400] Out: 2655 [Urine:2450; Drains:205] Intake/Output this shift: Total I/O In: 360 [P.O.:360] Out: 240 [Urine:200; Drains:40]  General appearance: alert, cooperative and no distress Resp: breathing comfortably GI: soft, non distended, dressing c/d/i.  OnQ in place, empty.  RIH incision OK.  Drain serosang. Extremities: extremities normal, atraumatic, no cyanosis or edema  Lab Results:   Recent Labs  08/29/15 0829 08/30/15 0612  WBC 11.8* 12.6*  HGB 13.1 12.4*  HCT 41.0 37.6*  PLT 179 194   BMET  Recent Labs  08/29/15 0829 08/30/15 0612  NA 135 136  K 3.9 3.7  CL 104 105  CO2 24 23  GLUCOSE 140* 127*  BUN <5* <5*  CREATININE 0.68 0.54*  CALCIUM 8.7* 8.8*   PT/INR  Recent Labs  08/28/15 0433  LABPROT 16.1*  INR 1.28   ABG No results for input(s): PHART, HCO3 in the last 72 hours.  Invalid input(s): PCO2, PO2  Studies/Results: No results found.  Anti-infectives: Anti-infectives    Start     Dose/Rate Route Frequency Ordered Stop   08/27/15 1800  ceFAZolin (ANCEF) IVPB 2g/100 mL premix     2 g 200 mL/hr over 30 Minutes Intravenous Every 8 hours 08/27/15 1549 08/27/15 1914   08/27/15 0704  ceFAZolin (ANCEF) IVPB 2g/100 mL premix     2 g 200 mL/hr over 30 Minutes Intravenous On call to O.R. 08/27/15 0704 08/27/15 1018       Assessment/Plan: s/p Procedure(s): LAPAROSCOPY DIAGNOSTIC (N/A) POSSIBLE OPEN DISTAL PANCREATECTOMY AND SPLENECTOMY (N/A) OPEN RIGHT INGUINAL HERNIA  (Right) INSERTION OF MESH (Right) Full liquids.  Advance when flatus present. IS, ambulate.   Pathology T1N1. Will need outpatient oncology and radiation referral.     LOS: 3 days    [facility] 08/30/2015

## 2015-08-30 NOTE — Progress Notes (Signed)
Pt want PCA to be D'Cd this AM because it kept beeping. Called Dr. Hulen Skains got an order to discontinue PCA. Wasted 12ml of dilaudid in the sink verified with Hattie Perch. Will continue to monitor pt.

## 2015-08-31 LAB — GLUCOSE, CAPILLARY
GLUCOSE-CAPILLARY: 120 mg/dL — AB (ref 65–99)
GLUCOSE-CAPILLARY: 136 mg/dL — AB (ref 65–99)
Glucose-Capillary: 152 mg/dL — ABNORMAL HIGH (ref 65–99)
Glucose-Capillary: 125 mg/dL — ABNORMAL HIGH (ref 65–99)

## 2015-08-31 LAB — COMPREHENSIVE METABOLIC PANEL
ALT: 21 U/L (ref 17–63)
ANION GAP: 7 (ref 5–15)
AST: 19 U/L (ref 15–41)
Albumin: 2.7 g/dL — ABNORMAL LOW (ref 3.5–5.0)
Alkaline Phosphatase: 58 U/L (ref 38–126)
BILIRUBIN TOTAL: 0.7 mg/dL (ref 0.3–1.2)
BUN: 5 mg/dL — ABNORMAL LOW (ref 6–20)
CALCIUM: 8.6 mg/dL — AB (ref 8.9–10.3)
CO2: 25 mmol/L (ref 22–32)
Chloride: 102 mmol/L (ref 101–111)
Creatinine, Ser: 0.66 mg/dL (ref 0.61–1.24)
GFR calc Af Amer: >60 mL/min (ref >60)
Glucose, Bld: 114 mg/dL — ABNORMAL HIGH (ref 65–99)
POTASSIUM: 3.7 mmol/L (ref 3.5–5.1)
Sodium: 134 mmol/L — ABNORMAL LOW (ref 135–145)
TOTAL PROTEIN: 5.7 g/dL — AB (ref 6.5–8.1)

## 2015-08-31 LAB — CBC
HEMATOCRIT: 37.1 % — AB (ref 39.0–52.0)
HEMOGLOBIN: 12 g/dL — AB (ref 13.0–17.0)
MCH: 30.9 pg (ref 26.0–34.0)
MCHC: 32.3 g/dL (ref 30.0–36.0)
MCV: 95.6 fL (ref 78.0–100.0)
Platelets: 239 10*3/uL (ref 150–400)
RBC: 3.88 MIL/uL — ABNORMAL LOW (ref 4.22–5.81)
RDW: 13.8 % (ref 11.5–15.5)
WBC: 9 10*3/uL (ref 4.0–10.5)

## 2015-08-31 NOTE — Progress Notes (Signed)
PT Cancellation Note  Patient Details Name: Perry Robinson MRN: [id number] DOB: 01/01/1951   Cancelled Treatment:    Reason Eval/Treat Not Completed: Other (comment)  Noted pt had PT order this am, which has now been discontinued.  Please re-order PT if pt requires PT eval.     Mizani Dilday F 08/31/2015, 8:01 AM

## 2015-08-31 NOTE — Progress Notes (Signed)
Patient ID: Perry Robinson, male   DOB: [birth year], 65 y.o.   MRN: [id number] 4 Days Post-Op  Subjective: + flatus. ? BM.  Tolerated full liquids.    Objective: Vital signs in last 24 hours: Temp:  [97.7 F (36.5 C)-98.3 F (36.8 C)] 98.3 F (36.8 C) (06/30 0523) Pulse Rate:  [67-68] 68 (06/30 0523) Resp:  [18] 18 (06/29 2109) BP: (154-162)/(74-79) 154/79 mmHg (06/30 0523) SpO2:  [96 %-97 %] 97 % (06/30 0523) Last BM Date: 08/27/15  Intake/Output from previous day: 06/29 0701 - 06/30 0700 In: 2570 [P.O.:1200; I.V.:1320; IV Piggyback:50] Out: 1035 [Urine:925; Drains:110] Intake/Output this shift: Total I/O In: 905 [P.O.:600; I.V.:255; IV Piggyback:50] Out: 595 [Urine:450; Drains:145]  General appearance: alert, cooperative and no distress Resp: breathing comfortably GI: soft, non distended, dressing c/d/i.   Drain serosang. Extremities: extremities normal, atraumatic, no cyanosis or edema  Lab Results:   Recent Labs  08/30/15 0612 08/31/15 0555  WBC 12.6* 9.0  HGB 12.4* 12.0*  HCT 37.6* 37.1*  PLT 194 239   BMET  Recent Labs  08/30/15 0612 08/31/15 0555  NA 136 134*  K 3.7 3.7  CL 105 102  CO2 23 25  GLUCOSE 127* 114*  BUN <5* 5*  CREATININE 0.54* 0.66  CALCIUM 8.8* 8.6*   PT/INR No results for input(s): LABPROT, INR in the last 72 hours. ABG No results for input(s): PHART, HCO3 in the last 72 hours.  Invalid input(s): PCO2, PO2  Studies/Results: No results found.  Anti-infectives: Anti-infectives    Start     Dose/Rate Route Frequency Ordered Stop   08/27/15 1800  ceFAZolin (ANCEF) IVPB 2g/100 mL premix     2 g 200 mL/hr over 30 Minutes Intravenous Every 8 hours 08/27/15 1549 08/27/15 1914   08/27/15 0704  ceFAZolin (ANCEF) IVPB 2g/100 mL premix     2 g 200 mL/hr over 30 Minutes Intravenous On call to O.R. 08/27/15 0704 08/27/15 1018      Assessment/Plan: s/p Procedure(s): LAPAROSCOPY DIAGNOSTIC (N/A) POSSIBLE OPEN DISTAL  PANCREATECTOMY AND SPLENECTOMY (N/A) OPEN RIGHT INGUINAL HERNIA  (Right) INSERTION OF MESH (Right) D/c drain Advance diet Possibly home tomorrow. IS, ambulate.   Pathology T1N1. Will need outpatient oncology and radiation referral.     LOS: 4 days    Perry Robinson 08/31/2015

## 2015-08-31 NOTE — Progress Notes (Signed)
Drain removed per MD order. Clean gauze dressing applied.

## 2015-09-01 LAB — CBC
HCT: 41.4 % (ref 39.0–52.0)
Hemoglobin: 13.8 g/dL (ref 13.0–17.0)
MCH: 31.7 pg (ref 26.0–34.0)
MCHC: 33.3 g/dL (ref 30.0–36.0)
MCV: 95.2 fL (ref 78.0–100.0)
PLATELETS: 335 10*3/uL (ref 150–400)
RBC: 4.35 MIL/uL (ref 4.22–5.81)
RDW: 13.5 % (ref 11.5–15.5)
WBC: 9.2 10*3/uL (ref 4.0–10.5)

## 2015-09-01 LAB — COMPREHENSIVE METABOLIC PANEL
ALBUMIN: 2.7 g/dL — AB (ref 3.5–5.0)
ALK PHOS: 63 U/L (ref 38–126)
ALT: 20 U/L (ref 17–63)
AST: 20 U/L (ref 15–41)
Anion gap: 5 (ref 5–15)
BILIRUBIN TOTAL: 0.5 mg/dL (ref 0.3–1.2)
BUN: 10 mg/dL (ref 6–20)
CHLORIDE: 104 mmol/L (ref 101–111)
CO2: 25 mmol/L (ref 22–32)
CREATININE: 0.66 mg/dL (ref 0.61–1.24)
Calcium: 8.8 mg/dL — ABNORMAL LOW (ref 8.9–10.3)
Glucose, Bld: 133 mg/dL — ABNORMAL HIGH (ref 65–99)
Potassium: 3.8 mmol/L (ref 3.5–5.1)
Sodium: 134 mmol/L — ABNORMAL LOW (ref 135–145)
TOTAL PROTEIN: 6.1 g/dL — AB (ref 6.5–8.1)

## 2015-09-01 LAB — GLUCOSE, CAPILLARY: GLUCOSE-CAPILLARY: 101 mg/dL — AB (ref 65–99)

## 2015-09-01 NOTE — Progress Notes (Signed)
Discharge paperwork given to patient. IV removed. No questions verbalized

## 2015-09-01 NOTE — Progress Notes (Signed)
Pt discharged by MD, waiting on discharge paperwork at this moment

## 2015-09-01 NOTE — Progress Notes (Signed)
Pt discharged home in stable condition 

## 2015-09-01 NOTE — Progress Notes (Signed)
Patient ID: Perry Robinson, male   DOB: 1950-08-29, 65 y.o.   MRN: [id number]  [facility]  Subjective: POD#5 - patient up in chair, fully dressed, ready to go home.  Tolerating diet, passing flatus.  Ambulating in halls.  No complaints.  Objective: Vital signs in last 24 hours: Temp:  [98.3 F (36.8 C)-98.6 F (37 C)] 98.6 F (37 C) (07/01 0605) Pulse Rate:  [77-81] 77 (07/01 0605) Resp:  [18-20] 20 (07/01 0605) BP: (120-156)/(69-74) 156/73 mmHg (07/01 0605) SpO2:  [97 %-98 %] 98 % (07/01 0605) Last BM Date: 08/27/15  Intake/Output from previous day: 06/30 0701 - 07/01 0700 In: 1745 [P.O.:1440; I.V.:255; IV Piggyback:50] Out: [id number] [Urine:1575; Drains:145] Intake/Output this shift: Total I/O In: 240 [P.O.:240] Out: 200 [Urine:200]  Physical Exam: HEENT - sclerae clear, mucous membranes moist Neck - soft Chest - clear bilaterally Cor - RRR Abdomen - soft, mild distension; dressings removed, wounds dry and intact Ext - no edema, non-tender Neuro - alert & oriented, no focal deficits  Lab Results:   Recent Labs  08/31/15 0555 09/01/15 0705  WBC 9.0 9.2  HGB 12.0* 13.8  HCT 37.1* 41.4  PLT 239 335   BMET  Recent Labs  08/30/15 0612 08/31/15 0555  NA 136 134*  K 3.7 3.7  CL 105 102  CO2 23 25  GLUCOSE 127* 114*  BUN <5* 5*  CREATININE 0.54* 0.66  CALCIUM 8.8* 8.6*   PT/INR No results for input(s): LABPROT, INR in the last 72 hours. Comprehensive Metabolic Panel:    Component Value Date/Time   NA 134* 08/31/2015 0555   NA 136 08/30/2015 0612   K 3.7 08/31/2015 0555   K 3.7 08/30/2015 0612   CL 102 08/31/2015 0555   CL 105 08/30/2015 0612   CO2 25 08/31/2015 0555   CO2 23 08/30/2015 0612   BUN 5* 08/31/2015 0555   BUN <5* 08/30/2015 0612   CREATININE 0.66 08/31/2015 0555   CREATININE 0.54* 08/30/2015 0612   CREATININE 0.61* 06/05/2015 0911   CREATININE 0.76 03/23/2013 1510   GLUCOSE 114* 08/31/2015 0555    GLUCOSE 127* 08/30/2015 0612   CALCIUM 8.6* 08/31/2015 0555   CALCIUM 8.8* 08/30/2015 0612   AST 19 08/31/2015 0555   AST 27 08/30/2015 0612   ALT 21 08/31/2015 0555   ALT 26 08/30/2015 0612   ALKPHOS 58 08/31/2015 0555   ALKPHOS 60 08/30/2015 0612   BILITOT 0.7 08/31/2015 0555   BILITOT 0.7 08/30/2015 0612   PROT 5.7* 08/31/2015 0555   PROT 6.1* 08/30/2015 0612   ALBUMIN 2.7* 08/31/2015 0555   ALBUMIN 2.8* 08/30/2015 0612    Studies/Results: No results found.  Assessment & Plans: s/p Procedure(s): LAPAROSCOPY DIAGNOSTIC (N/A) POSSIBLE OPEN DISTAL PANCREATECTOMY AND SPLENECTOMY (N/A) OPEN RIGHT INGUINAL HERNIA (Right) INSERTION OF MESH (Right)  Discontinue IV  Patient does not want pain Rx  Discharge home today  Follow up at [place] office next week for staple removal and wound check  Earnstine Regal, MD, [facility] Office: [place] 09/01/2015

## 2015-09-06 ENCOUNTER — Ambulatory Visit: Payer: Managed Care, Other (non HMO) | Admitting: Radiation Oncology

## 2015-09-11 ENCOUNTER — Other Ambulatory Visit: Payer: Managed Care, Other (non HMO)

## 2015-09-11 ENCOUNTER — Ambulatory Visit (HOSPITAL_BASED_OUTPATIENT_CLINIC_OR_DEPARTMENT_OTHER): Payer: Managed Care, Other (non HMO) | Admitting: Genetic Counselor

## 2015-09-11 ENCOUNTER — Ambulatory Visit: Payer: Managed Care, Other (non HMO) | Admitting: Oncology

## 2015-09-11 DIAGNOSIS — Z8042 Family history of malignant neoplasm of prostate: Secondary | ICD-10-CM

## 2015-09-11 DIAGNOSIS — C251 Malignant neoplasm of body of pancreas: Secondary | ICD-10-CM

## 2015-09-11 DIAGNOSIS — Z8546 Personal history of malignant neoplasm of prostate: Secondary | ICD-10-CM | POA: Diagnosis not present

## 2015-09-11 DIAGNOSIS — Z315 Encounter for genetic counseling: Secondary | ICD-10-CM

## 2015-09-12 ENCOUNTER — Encounter: Payer: Self-pay | Admitting: Genetic Counselor

## 2015-09-12 DIAGNOSIS — Z8042 Family history of malignant neoplasm of prostate: Secondary | ICD-10-CM | POA: Insufficient documentation

## 2015-09-12 NOTE — Progress Notes (Signed)
REFERRING PROVIDER: Stark Klein, MD  PRIMARY PROVIDER:  Windell Hummingbird, PA-C  PRIMARY REASON FOR VISIT:  1. Cancer of pancreas, body ([place])   2. History of prostate cancer   3. Family history of prostate cancer      HISTORY OF PRESENT ILLNESS:   Mr. Bulkley, a 65 y.o. male, was seen for a [place] cancer genetics consultation at the request of [name] due to a personal history of prostate and pancreatic cancers and family history of prostate cancer.  Mr. Petsch presents to clinic today to discuss the possibility of a hereditary predisposition to cancer, genetic testing, and to further clarify his future cancer risks, as well as potential cancer risks for family members.   In April 2017, at the age of 72, Mr. Degregory was diagnosed with adenocarcinoma of the body of the pancreas.  He recently underwent diagnostic laparoscopy to determine if he is a surgical candidate.  If he is so determined, Dr. Benay Spice has also recommended he have adjuvant chemotherapy.  Mr. Testa also has a history of prostate cancer, diagnosed in June 2006 at the age of 9.  Gleason score was determined to be a 7, and this was treated with prostatectomy.      CANCER HISTORY:    Cancer of pancreas, body ([place])   06/22/2015 Tumor Marker CA 19-9=94.0   06/28/2015 Initial Diagnosis Cancer of pancreas, body ([place])   06/28/2015 Procedure EUS: Irregular mass pancreatic body 17 mm;no clear involvement of SMA, SMV, celiac trunk or portal vein;CBD normal;pancreatic duct dilated   07/04/2015 Imaging CT CHEST: Chest negative; pancreatic body mass identified     RISK FACTORS:  Colonoscopy: yes, most recent in 05/2013; 4 polyps found at the time - 2 sessile serrated, 1 hyperplastic, and 1 tubular adenoma. Up to date with prostate exams:  N/a. Any excessive radiation exposure in the past:  no  Past Medical History  Diagnosis Date  . Hypertension   . GERD (gastroesophageal reflux disease)   . Prostate cancer ([place])  07/11/2004  . Cataract   . Inguinal hernia   . Cancer of pancreas, body ([place]) 08/2015  . Diverticulosis     Past Surgical History  Procedure Laterality Date  . Prostatectomy  08/2004    2nd to CA  . Cataract extraction Bilateral 2011  . Eye surgery    . Rotator cuff repair Bilateral 10/2008    11,10  . Eus N/A 06/28/2015    Procedure: UPPER ENDOSCOPIC ULTRASOUND (EUS) LINEAR;  Surgeon: [name], MD;  Location: [facility];  Service: Endoscopy;  Laterality: N/A;  spoke with patient about new check in/procedure times JB  . Fine needle aspiration  06/28/2015    Procedure: FINE NEEDLE ASPIRATION;  Surgeon: [name], MD;  Location: [facility] ENDOS[facility];  Service: Endoscopy;;  . Laparoscopy N/A 08/27/2015    Procedure: LAPAROSCOPY DIAGNOSTIC;  Surgeon: Stark Klein, MD;  Location: [place];  Service: General;  Laterality: N/A;  . Inguinal hernia repair Right 08/27/2015    Procedure: OPEN RIGHT INGUINAL HERNIA ;  Surgeon: Ralene Ok, MD;  Location: [place];  Service: General;  Laterality: Right;  . Insertion of mesh Right 08/27/2015    Procedure: INSERTION OF MESH;  Surgeon: Ralene Ok, MD;  Location: [place];  Service: General;  Laterality: Right;    Social History   Social History  . Marital Status: Married    Spouse Name: N/A  . Number of Children: 0  . Years of Education: N/A   Social History  Main Topics  . Smoking status: Current Every Day Smoker -- 0.25 packs/day for 10 years    Types: Cigarettes  . Smokeless tobacco: Never Used  . Alcohol Use: 16.8 oz/week    28 Cans of beer per week     Comment: "4 beers a day" per patient.   . Drug Use: No  . Sexual Activity: Yes   Other Topics Concern  . Not on file   Social History Narrative   Married. Education; high school. 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  Significant diagnoses  are listed below: Family History  Problem Relation Age of Onset  . Thyroid disease Mother   . Colon cancer Neg Hx   . Prostate cancer Brother     dx. late 9s; treated with stem cell therapy  . Prostate cancer Maternal Grandfather     d. middle ages    Mr. Tenbrink has three full sisters and two full brothers, ages 27-71.  One brother, Chrissie Noa, was diagnosed with prostate cancer in his late 77s.  Mr. Madan has some limited medical information for his siblings and several other of his family members, as he is not much in contact with them and he doesn't seek out this information.  His mother passed away at 55 and he could not say for sure whether she had ever had cancer.  His father passed away at 29 and he was not aware of a cancer history for him.    Mr. Deboy mother had six full sisters and five full brothers.  One brother is 89 currently, while all others have passed away.  Mr. Subramaniam reports that most of his aunts passed away at later ages.  He reports that three of the four uncles who have passed away did pass away at "fairly young" ages, however he does not believe their passing was due to cancer.  He has limited further information for these relatives and for their children.  His maternal grandmother died in her 53s.  His grandfather died of prostate cancer in his middle ages.  He has no information for any maternal great aunts/uncles or great grandparents.    Mr. Bertoni father had three full sisters and two full brothers.  One sister is currently in her 27s, the other have passed away.  Mr. Maser has no further information for these relatives.  He has no information for his paternal grandparents.    Mr. Barajas is unaware of any prior family history of genetic testing for hereditary cancer.  Patient's maternal ancestors are of Native American/[place]rokee descent, and paternal ancestors are of [place] and [place] descent. There is no reported Ashkenazi Jewish ancestry. There is  no known consanguinity.  GENETIC COUNSELING ASSESSMENT: XAN INGRAHAM is a 65 y.o. male with a personal and family history of pancreatic/prostate cancers which is somewhat suggestive of a hereditary cancer syndrome and predisposition to cancer. We, therefore, discussed and recommended the following at today's visit.   DISCUSSION: We reviewed the characteristics, features and inheritance patterns of hereditary cancer syndromes, particularly those caused by mutations within the BRCA1/2, and HOXB13 genes. We also discussed genetic testing, including the appropriate family members to test, the process of testing, insurance coverage and turn-around-time for results. We discussed the implications of a negative, positive and/or variant of uncertain significant result. We recommended Mr. Andersson pursue genetic testing for  a Custom Panel through [facility] that combines the relevant pancreatic and prostate cancer-related genes.    Based on Mr. Gaymon's personal and family history of cancer, he meets medical criteria for genetic testing. Despite that he meets criteria, he may still have an out of pocket cost. We discussed that if his out of pocket cost for testing is over $100, the laboratory will call and confirm whether he wants to proceed with testing.  If the out of pocket cost of testing is less than $100 he will be billed by the genetic testing laboratory.   Mr. Glace chose not to have genetic testing at today's appointment.  He would like to consider this option further and also to discuss with his wife.  He did not entirely rule out genetic testing at this time.  He is interested in the benefits that it may provide to his family members, in particular his sisters.  He has our contact information if he has any questions, or should he decide he would like to set up a follow-up appointment to have genetic testing at a later date.    PLAN: Despite our recommendation, Mr. Tenbrink did not wish to pursue  genetic testing at today's visit. We understand this decision, and remain available to coordinate genetic testing at any time in the future. We; therefore, recommend Mr. Torrez continue to follow the cancer screening guidelines given by his primary healthcare provider.  Based on Mr. Laforge's family history, we also recommended that his brother, who was diagnosed with prostate cancer in his late 51s, consider having genetic counseling and testing. Mr. Selders will let us know if we can be of any assistance in coordinating genetic counseling and/or testing for this family member.   Lastly, we encouraged Mr. Postlewait to remain in contact with cancer genetics annually so that we can continuously update the family history and inform him of any changes in cancer genetics and testing that may be of benefit for this family.   Mr.  Dauphine questions were answered to his satisfaction today. Our contact information was provided should additional questions or concerns arise. Thank you for the referral and allowing [place] to share in the care of your patient.   Jeanine Luz, MS, [facility] Certified Genetic Counselor [email or web address] Phone: [phone]  The patient was seen for a total of 60 minutes in face-to-face genetic counseling.  This patient was discussed with Drs. Magrinat, Lindi Adie and/or Burr Medico who agrees with the above.    _______________________________________________________________________ For Office Staff:  Number of people involved in session: 2 Was an Intern/ student involved with case: yes

## 2015-09-13 NOTE — Discharge Summary (Signed)
Physician Discharge Summary  Patient ID: Perry Robinson MRN: [id number] DOB/AGE: October 08, 1950 65 y.o.  Admit date: 08/27/2015 Discharge date: 09/13/2015  Admission Diagnoses: Patient Active Problem List   Diagnosis Date Noted  . Family history of prostate cancer 09/12/2015  . Carcinoma of body of pancreas ([place]) 08/27/2015  . Cancer of pancreas, body ([place]) 06/28/2015  . History of cervical discectomy 05/29/2015  . Diverticulosis of colon 06/09/2013  . Back pain with left-sided radiculopathy 04/13/2013  . Hypertension   . Prostate cancer [facility])      Discharge Diagnoses:  Active Problems:   Carcinoma of body of pancreas ([place]) pT3N1  Discharged Condition: stable  Hospital Course:  Pt was admitted to the hospital following an open distal pancreatectomy.  He had an anticipated ileus for several days.  He was able to advance his diet.  He was able to transition to oral pain medications. He was able to ambulate independently.     Consults: None  Significant Diagnostic Studies: labs: Albumin 2.7  Treatments: surgery: see above  Discharge Exam: Blood pressure 156/73, pulse 77, temperature 98.6 F (37 C), temperature source Oral, resp. rate 20, height 5' 8.5" (1.74 m), weight 77.3 kg (170 lb 6.7 oz), SpO2 98 %. General appearance: alert, cooperative and no distress Resp: breathing comfortably GI: soft, non distended, appro ptender  Disposition: 01-Home or Self Care  Discharge Instructions    Apply dressing    Complete by:  As directed   Apply light gauze dressing to wound before discharge home today.     Diet - low sodium heart healthy    Complete by:  As directed      Discharge instructions    Complete by:  As directed   [facility]  OPEN ABDOMINAL SURGERY: POST OP INSTRUCTIONS  Always review your discharge instruction sheet given to you by the facility where your surgery was performed.  A prescription for pain medication may be given to you upon  discharge.  Take your pain medication as prescribed.  If narcotic pain medicine is not needed, then you may take acetaminophen (Tylenol) or ibuprofen (Advil) as needed. Take your usually prescribed medications unless otherwise directed. If you need a refill on your pain medication, please contact your pharmacy. They will contact our office to request authorization.  Prescriptions will not be filled after 5 pm or on weekends. You should follow a light diet the first few days after arrival home, such as soup and crackers, unless your doctor has advised otherwise. A high-fiber, low fat diet can be resumed as tolerated.  Be sure to include plenty of fluids daily.  Most patients will experience some swelling and bruising in the area of the incision. Ice packs will help. Swelling and bruising can take several days to resolve. It is common to experience some constipation if taking pain medication after surgery.  Increasing fluid intake and taking a stool softener will usually help or prevent this problem from occurring.  A mild laxative (Milk of Magnesia or Miralax) should be taken according to package directions if there are no bowel movements after 48 hours.  You may have steri-strips (small skin tapes) in place directly over the incision.  These strips should be left on the skin for 5-7 days.  Any sutures or staples will be removed at the office during your follow-up visit. You may find that a light gauze bandage over your incision may keep your staples from being rubbed or pulled. You may shower and replace  the bandage daily. ACTIVITIES:  You may resume regular (light) daily activities beginning the next day - such as daily self-care, walking, climbing stairs - gradually increasing activities as tolerated.  You may have sexual intercourse when it is comfortable.  Refrain from any heavy lifting or straining until approved by your doctor.  You may drive when you no longer are taking prescription pain medication,  you can comfortably wear a seatbelt, and you can safely maneuver your car and apply brakes. You should see your doctor in the office for a follow-up appointment approximately 2-3 weeks after your surgery.  Make sure that you call for this appointment within a day or two after you arrive home to insure a convenient appointment time.  WHEN TO CALL YOUR DOCTOR: Fever greater than 101.0 Inability to urinate Persistent nausea and/or vomiting Extreme swelling or bruising Continued bleeding from incision Increased pain, redness, or drainage from the incision Difficulty swallowing or breathing Muscle cramping or spasms Numbness or tingling in hands or around lips  IF YOU HAVE DISABILITY OR FAMILY LEAVE FORMS, YOU MUST BRING THEM TO THE OFFICE FOR PROCESSING.  PLEASE DO NOT GIVE THEM TO YOUR DOCTOR.  The clinic staff is available to answer your questions during regular business hours.  Please don't hesitate to call and ask to speak to one of the nurses if you have concerns.  [facility] [place] Office: [phone]  For further questions, please visit [email or web address]     Increase activity slowly    Complete by:  As directed      Remove dressing in 24 hours    Complete by:  As directed             Medication List    TAKE these medications        lisinopril 20 MG tablet  Commonly known as:  PRINIVIL,ZESTRIL  Take 1 tablet (20 mg total) by mouth daily.     oxyCODONE-acetaminophen 5-325 MG tablet  Commonly known as:  ROXICET  Take 1-2 tablets by mouth every 4 (four) hours as needed.           Follow-up Information    Follow up with [facility], MD In 5 days.   Specialty:  General Surgery   Why:  For suture removal   Contact information:   [place] [address] [phone]       Signed: Stark Klein 09/13/2015, 7:50 AM

## 2015-09-18 ENCOUNTER — Telehealth: Payer: Self-pay | Admitting: *Deleted

## 2015-09-18 ENCOUNTER — Other Ambulatory Visit: Payer: Managed Care, Other (non HMO)

## 2015-09-18 ENCOUNTER — Other Ambulatory Visit: Payer: Self-pay | Admitting: Urology

## 2015-09-18 ENCOUNTER — Ambulatory Visit (HOSPITAL_BASED_OUTPATIENT_CLINIC_OR_DEPARTMENT_OTHER): Payer: Managed Care, Other (non HMO) | Admitting: Nurse Practitioner

## 2015-09-18 ENCOUNTER — Telehealth: Payer: Self-pay | Admitting: Oncology

## 2015-09-18 VITALS — BP 141/74 | HR 91 | Temp 98.6°F | Resp 18 | Ht 68.5 in | Wt 152.5 lb

## 2015-09-18 DIAGNOSIS — C779 Secondary and unspecified malignant neoplasm of lymph node, unspecified: Secondary | ICD-10-CM

## 2015-09-18 DIAGNOSIS — J449 Chronic obstructive pulmonary disease, unspecified: Secondary | ICD-10-CM

## 2015-09-18 DIAGNOSIS — Z720 Tobacco use: Secondary | ICD-10-CM

## 2015-09-18 DIAGNOSIS — C251 Malignant neoplasm of body of pancreas: Secondary | ICD-10-CM | POA: Diagnosis not present

## 2015-09-18 DIAGNOSIS — I10 Essential (primary) hypertension: Secondary | ICD-10-CM | POA: Diagnosis not present

## 2015-09-18 DIAGNOSIS — C61 Malignant neoplasm of prostate: Secondary | ICD-10-CM

## 2015-09-18 MED ORDER — PROCHLORPERAZINE MALEATE 10 MG PO TABS: 10.0000 mg | ORAL_TABLET | Freq: Four times a day (QID) | ORAL | Status: DC | PRN

## 2015-09-18 MED ORDER — LIDOCAINE-PRILOCAINE 2.5-2.5 % EX CREA: TOPICAL_CREAM | CUTANEOUS | Status: DC

## 2015-09-18 NOTE — Progress Notes (Addendum)
[place] OFFICE PROGRESS NOTE   Diagnosis:  Pancreas cancer  INTERVAL HISTORY:   Perry Robinson returns as scheduled. He underwent an open distal subtotal pancreatectomy and splenectomy by [name] 08/27/2015. He feels he is recovering well from surgery. Main complaint is "itching" around the incision. He denies pain. No diarrhea. He has a good appetite. No nausea or vomiting. No shortness of breath. No leg swelling or calf pain.  Objective:  Vital signs in last 24 hours:  Blood pressure 141/74, pulse 91, temperature 98.6 F (37 C), temperature source Oral, resp. rate 18, height 5' 8.5" (1.74 m), weight 152 lb 8 oz (69.174 kg), SpO2 100 %.    HEENT: No thrush or ulcers. Resp: Lungs clear bilaterally. Cardio: Regular rate and rhythm. GI: Abdomen is soft. Tender right upper abdomen. No hepatomegaly. Healed midline abdominal incision. Vascular: No leg edema.   Lab Results:  Lab Results  Component Value Date   WBC 9.2 09/01/2015   HGB 13.8 09/01/2015   HCT 41.4 09/01/2015   MCV 95.2 09/01/2015   PLT 335 09/01/2015   NEUTROABS 2.7 08/22/2015    Imaging:  No results found.  Medications: I have reviewed the patient's current medications.  Assessment/Plan: 1. Adenocarcinoma of the pancreas body, clinical stage IA (T1 N0); pathologic stage T1, N1  EUS 06/28/2015 revealed no evidence of vascular invasion or lymphadenopathy, FNA biopsy of the pancreas body mass confirmed adenocarcinoma  No evidence for metastatic disease on a staging chest CT 07/04/2015  [facility] 06/07/2015 confirmed a pancreas body mass, questionable extension to the superior mesenteric artery  Elevated CA 19-9  Status post open distal subtotal pancreatectomy and splenectomy 08/27/2015. Pathology showed invasive moderately differentiated adenocarcinoma spanning 1.8 cm in greatest dimension. Associated high-grade pancreatic intraepithelial neoplasia. Background chronic  pancreatitis. Margins negative. 3 of 11 lymph nodes positive for metastatic adenocarcinoma. Benign spleen. pT1, pN1   2. Prostate cancer in 2006, status post a prostatectomy  3. COPD/ongoing tobacco use  4. Hypertension   Disposition:Perry Robinson appears stable. He is recovering from the recent surgery. Dr. Benay Spice reviewed the pathology report with him at today's visit. He understands there is a significant chance for developing recurrent disease over the next several years.  Dr. Benay Spice recommends adjuvant chemotherapy with gemcitabine and Xeloda for a 6 month course. The gemcitabine will be given on a day 1, day 8, day 15 schedule of a 28 day cycle; Xeloda will be given days 1 through 21 of a 28 day cycle.  We reviewed potential toxicities associated with chemotherapy including myelosuppression, nausea, mouth sores, diarrhea, hair loss. We reviewed the potential for fever, rash, pneumonitis with gemcitabine. We discussed the skin rash associated with Xeloda as well as hand-foot syndrome, skin hyperpigmentation and increased sensitivity to the sun. We recommended placement of a Port-A-Cath. He will attend a chemotherapy education class. Prescriptions were sent to his pharmacy for Compazine and Emla cream.  At the end of today's visit he was undecided regarding proceeding as outlined above. He will return for a follow-up visit in one week for additional discussion.  Patient seen with Dr. Benay Spice. 25 minutes were spent face-to-face at today's visit with the majority of that time involved in counseling/coordination of care.     Ned Card ANP/GNP-BC   09/18/2015  10:55 AM   This was a shared visit with Ned Card. Perry Robinson underwent a subtotal pancreatectomy. We reviewed the details of the surgical pathology report with him. I recommend adjuvant chemotherapy. We  reviewed the specifics of the gemcitabine/capecitabine regimen including potential toxicities. He is undecided  on completing adjuvant therapy. He will return for a office visit and further discussion in one week. We will refer him to [name] for placement of a Port-A-Cath if he agrees to chemotherapy.  [name], M.D.

## 2015-09-18 NOTE — Telephone Encounter (Signed)
Gave patient avs report and appointments for July and August.  °

## 2015-09-18 NOTE — Telephone Encounter (Signed)
Message from pt asking what medications were called in for him. Returned call, informed him antiemetic was called in and cream for port. Pt became upset, informed him he does not need to pick this up right now. These will be reviewed in chemo class.  Pt went on to voice frustration about plan for chemo, port placement and "everything they want to do to me." He relates his mother's death to being "overmedicated" and is hesitant to agree to take treatment. Allowed pt to vent, he agrees to come in for chemo class 7/21 and will make a decision after that.

## 2015-09-19 ENCOUNTER — Ambulatory Visit: Payer: Managed Care, Other (non HMO) | Admitting: Radiation Oncology

## 2015-09-21 ENCOUNTER — Encounter: Payer: Self-pay | Admitting: *Deleted

## 2015-09-21 ENCOUNTER — Other Ambulatory Visit: Payer: Managed Care, Other (non HMO)

## 2015-09-24 ENCOUNTER — Encounter: Payer: Self-pay | Admitting: General Surgery

## 2015-09-24 ENCOUNTER — Ambulatory Visit
Admission: RE | Admit: 2015-09-24 | Discharge: 2015-09-24 | Disposition: A | Discharge status: Home / Self Care | Payer: Managed Care, Other (non HMO) | Source: Ambulatory Visit | Attending: Radiation Oncology | Admitting: Radiation Oncology

## 2015-09-24 ENCOUNTER — Encounter: Payer: Self-pay | Admitting: Radiation Oncology

## 2015-09-24 VITALS — BP 146/73 | HR 89 | Temp 98.8°F | Ht 69.0 in | Wt 151.4 lb

## 2015-09-24 DIAGNOSIS — Z9079 Acquired absence of other genital organ(s): Secondary | ICD-10-CM | POA: Diagnosis not present

## 2015-09-24 DIAGNOSIS — Z8042 Family history of malignant neoplasm of prostate: Secondary | ICD-10-CM | POA: Insufficient documentation

## 2015-09-24 DIAGNOSIS — Z9081 Acquired absence of spleen: Secondary | ICD-10-CM | POA: Diagnosis not present

## 2015-09-24 DIAGNOSIS — Z9841 Cataract extraction status, right eye: Secondary | ICD-10-CM | POA: Diagnosis not present

## 2015-09-24 DIAGNOSIS — Z8546 Personal history of malignant neoplasm of prostate: Secondary | ICD-10-CM | POA: Diagnosis not present

## 2015-09-24 DIAGNOSIS — F17210 Nicotine dependence, cigarettes, uncomplicated: Secondary | ICD-10-CM | POA: Insufficient documentation

## 2015-09-24 DIAGNOSIS — Z923 Personal history of irradiation: Secondary | ICD-10-CM | POA: Diagnosis not present

## 2015-09-24 DIAGNOSIS — Z8507 Personal history of malignant neoplasm of pancreas: Secondary | ICD-10-CM | POA: Insufficient documentation

## 2015-09-24 DIAGNOSIS — C251 Malignant neoplasm of body of pancreas: Secondary | ICD-10-CM | POA: Diagnosis not present

## 2015-09-24 DIAGNOSIS — C61 Malignant neoplasm of prostate: Secondary | ICD-10-CM

## 2015-09-24 DIAGNOSIS — Z510 Encounter for antineoplastic radiation therapy: Secondary | ICD-10-CM | POA: Diagnosis not present

## 2015-09-24 DIAGNOSIS — K219 Gastro-esophageal reflux disease without esophagitis: Secondary | ICD-10-CM | POA: Insufficient documentation

## 2015-09-24 DIAGNOSIS — I10 Essential (primary) hypertension: Secondary | ICD-10-CM | POA: Insufficient documentation

## 2015-09-24 DIAGNOSIS — Z9842 Cataract extraction status, left eye: Secondary | ICD-10-CM | POA: Diagnosis not present

## 2015-09-24 NOTE — Progress Notes (Signed)
See progress note under physician encounter. 

## 2015-09-24 NOTE — Progress Notes (Addendum)
GI Location of Tumor / Histology: adenocarcinoma of pancreas  DARROW EGELER presented in April to his urologist office to review CT scan for EMBARK protocol. Incidentally there was a new 12 mm pan creatic mass in the body with some ductal dilation found.     Past/Anticipated interventions by surgeon, if any: diagnostic laparoscopy, open distal subtotal pancreatectomy and splenectomy done 08/27/15 by [name] in addition to insertion of mesh for open right inguinal hernia  Past/Anticipated interventions by medical oncology, if any: plan is to begin gemcitabine/capecitabine regimen on 8/8  Weight changes, if any: reports 20 lb weight loss following surgery  Bowel/Bladder complaints, if any: no  Nausea / Vomiting, if any: no  Pain issues, if any:  no  Any blood per rectum:   no  SAFETY ISSUES:  Prior radiation? Patient insist he had radiation therapy for prostate ca. Unsure this is the case since the patient had a prostatectomy.  Pacemaker/ICD? no  Possible current pregnancy? no  Is the patient on methotrexate? no  Current Complaints/Details: 65 year old male. Married for 22 years. Has no kids. Scheduled to return to work on August 26. 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 Initial chemotherapy infusion set for 8/8. Is confused about Xeloda. Vertical abdominal incision well approximated without redness, edema, or warmth. Vertical abdominal incision well healed. Reports fatigue. Loose tooth upper jaw. Encouraged to have tooth pulled before beginning radiation or chemotherapy.

## 2015-09-24 NOTE — Progress Notes (Signed)
Radiation Oncology         [phone] ________________________________  Initial outpatient Consultation  Name: Perry Robinson MRN: [id number]  Date: 09/24/2015  DOB: Nov 24, 1950  NA:TFTDD,UKGUR, PA-C  Stark Klein, MD   REFERRING PHYSICIAN: Stark Klein, MD  DIAGNOSIS: 65 y.o. gentleman with stage IIB, T1, N1 adenocarcinoma of the body of the pancreas    ICD-9-CM ICD-10-CM   1. Prostate cancer ([place]) 185 C61   2. Carcinoma of body of pancreas ([place]) 157.1 C25.1   3. Cancer of pancreas, body ([place]) 157.1 C25.1   4. Family history of prostate cancer V16.42 Z80.42     HISTORY OF PRESENT ILLNESS: Perry Robinson is a 65 y.o. male seen at the request of [name] for discussion of the role of radiotherapy for his newly diagnosed pancreatic cancer. On 06/07/15, the patient had a routine surveillance CT scan for a history of prostate cancer (prostatectomy in 2006) (EMBARK protocol) when it noted a 1.5 x 1.2 mm mass in the body of the pancreas with mild upstream duct dilation. The lesion was clear of the celiac trunk, but on the sagittal projection, there was questionable neural extension to the SMA, no retroperitoneal or portal adenopathy. Bone scan the same day revealed no evidence of skeletal metastases.  On 06/28/15, the patient had a fine needle aspiration of the lesion revealing malignant cells consistent with adenocarcinoma. The patient's CA 19-9 level was elevated at 94 on 06/21/15, pre-op.CT scan of the chest with contrast on 07/04/15 noted a 1.6 cm hypoenhancing mass in the pancreatic body/tail corresponding to a known pancreatic adenocarcinoma. Also of note were associated small upper abdominal lymph nodes.  The patient's case was presented at GI tumor conference and he was felt to be a candidate for surgical resection. He was taken to the OR on 08/27/15 for an open distal subtotal pancreatectomy and splenectomy by [name]. Final pathology revealed invasive moderately differentiated  adenocarcinoma of the pancreatic body measuring 1.8 cm, with negative margins, though 3/11 regional nodes were positive for disease. He has met with Dr. Benay Spice and Ned Card, NP and has plans to begin Gemcitabine and Xeloda on 10/09/15. He presents today to discuss the role of radiotherapy for his disease.   His PSA in December 2015 was 2.19.  PREVIOUS RADIATION THERAPY: Yes. The patient had a prostatectomy on 08/08/2004 with the tumor involving the inked resection margin in the left posterior apex. The patient states he had post-operative external beam radiation to the prostatic fossa and nodes with Dr. Sondra Come. pT2c, pN0, pMX adenocarcinoma of the prostate.  PAST MEDICAL HISTORY:  Past Medical History:  Diagnosis Date  . Cancer of pancreas, body ([place]) 08/2015  . Cataract   . Diverticulosis   . GERD (gastroesophageal reflux disease)   . Hypertension   . Inguinal hernia   . Prostate cancer ([place]) 07/11/2004      PAST SURGICAL HISTORY: Past Surgical History:  Procedure Laterality Date  . CATARACT EXTRACTION Bilateral 2011  . EUS N/A 06/28/2015   Procedure: UPPER ENDOSCOPIC ULTRASOUND (EUS) LINEAR;  Surgeon: [name], MD;  Location: [facility];  Service: Endoscopy;  Laterality: N/A;  spoke with patient about new check in/procedure times JB  . EYE SURGERY    . FINE NEEDLE ASPIRATION  06/28/2015   Procedure: FINE NEEDLE ASPIRATION;  Surgeon: [name], MD;  Location: [facility];  Service: Endoscopy;;  . INGUINAL HERNIA REPAIR Right 08/27/2015   Procedure: OPEN RIGHT INGUINAL HERNIA ;  Surgeon: Anne Hahn  Rosendo Gros, MD;  Location: [place];  Service: General;  Laterality: Right;  . INSERTION OF MESH Right 08/27/2015   Procedure: INSERTION OF MESH;  Surgeon: Ralene Ok, MD;  Location: [place];  Service: General;  Laterality: Right;  . LAPAROSCOPY N/A 08/27/2015   Procedure: LAPAROSCOPY DIAGNOSTIC;  Surgeon: Stark Klein, MD;  Location: [place];  Service: General;  Laterality: N/A;  .  PROSTATECTOMY  08/2004   2nd to CA  . ROTATOR CUFF REPAIR Bilateral 10/2008   11,10    FAMILY HISTORY:  Family History  Problem Relation Age of Onset  . Thyroid disease Mother   . Prostate cancer Father   . Prostate cancer Brother     dx. late 17s; treated with stem cell therapy  . Prostate cancer Maternal Grandfather     d. middle ages  . Colon cancer Neg Hx     SOCIAL HISTORY:  Social History   Social History  . Marital status: Married    Spouse name: N/A  . Number of children: 0  . Years of education: N/A   Occupational History  . Not on file.   Social History Main Topics  . Smoking status: Current Every Day Smoker    Packs/day: 0.25    Years: 10.00    Types: Cigarettes  . Smokeless tobacco: Never Used  . Alcohol use 16.8 oz/week    28 Cans of beer per week     Comment: "4 beers a day" per patient.   . Drug use: No  . Sexual activity: Yes   Other Topics Concern  . Not on file   Social History Narrative   Married. Education; high school. 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  MEDICATIONS:  Current Outpatient Prescriptions  Medication Sig Dispense Refill  . lisinopril (PRINIVIL,ZESTRIL) 20 MG tablet Take 1 tablet (20 mg total) by mouth daily. 90 tablet 1  . lidocaine-prilocaine (EMLA) cream Apply to portacath site 1 hour prior to being accessed (Patient not taking: Reported on 09/24/2015) 30 g 2  . oxyCODONE-acetaminophen (ROXICET) 5-325 MG tablet Take 1-2 tablets by mouth every 4 (four) hours as needed. (Patient not taking: Reported on 09/18/2015) 30 tablet 0  . prochlorperazine (COMPAZINE) 10 MG tablet Take 1 tablet (10 mg total) by mouth every 6 (six) hours as needed for nausea or vomiting. (Patient not taking: Reported on 09/24/2015) 30 tablet 2   No current facility-administered medications for this encounter.      REVIEW OF SYSTEMS:  On review of systems, the patient reports that he is doing well overall. He denies any chest pain, shortness of breath, cough, fevers, chills, or night sweats. He reports a 20 lb weight loss since surgery. He denies any bowel or bladder disturbances, and denies abdominal pain, nausea or vomiting. He reports a loose tooth that needs to be pulled before starting systemic treatment or radiation. He denies any new musculoskeletal or joint aches or pains. A complete review of systems is obtained and is otherwise negative.    PHYSICAL EXAM:  height is _0  (1.753 m) and weight is 151 lb 6.4 oz (68.7 kg). His oral temperature is 98.8 F (37.1 C). His blood pressure is 146/73 (abnormal) and his pulse is 89.   Pain Scale 0/10 In general this is a well appearing African-American male in no acute distress. He is alert and oriented x4  and appropriate throughout the examination. HEENT reveals that the patient is normocephalic, atraumatic. EOMs are intact. PERRLA. Skin is intact without any evidence of gross lesions. Cardiovascular exam reveals a regular rate and rhythm, no clicks rubs or murmurs are auscultated. Chest is clear to auscultation bilaterally. Lymphatic assessment is performed and does not reveal any adenopathy in the cervical, supraclavicular, axillary, or inguinal chains. Abdomen has active bowel sounds in all quadrants and is intact. Well healed mid-line laparotomy incision. His previous open prostatectomy scar along the pubic symthesis is intact and a small tattoo is noted along the midline. He also has a scar from a right inguinal hernia repair. The abdomen is soft, non tender, non distended. Lower extremities are negative for pretibial pitting edema, deep calf tenderness, cyanosis or clubbing.  KPS = 90  100 - Normal; no complaints; no evidence of disease. 90   - Able to carry on normal activity; minor signs or symptoms of disease. 80   - Normal activity with effort; some  signs or symptoms of disease. 81   - Cares for self; unable to carry on normal activity or to do active work. 60   - Requires occasional assistance, but is able to care for most of his personal needs. 50   - Requires considerable assistance and frequent medical care. 75   - Disabled; requires special care and assistance. 51   - Severely disabled; hospital admission is indicated although death not imminent. 5   - Very sick; hospital admission necessary; active supportive treatment necessary. 10   - Moribund; fatal processes progressing rapidly. 0     - Dead  Karnofsky DA, Abelmann [place], Craver LS and Burchenal [facility] [phone]) The use of the nitrogen mustards in the palliative treatment of carcinoma: with particular reference to bronchogenic carcinoma Cancer 1 [id number]  LABORATORY DATA:  Lab Results  Component Value Date   WBC 9.2 09/01/2015   HGB 13.8 09/01/2015   HCT 41.4 09/01/2015   MCV 95.2 09/01/2015   PLT 335 09/01/2015   Lab Results  Component Value Date   NA 134 (L) 09/01/2015   K 3.8 09/01/2015   CL 104 09/01/2015   CO2 25 09/01/2015   Lab Results  Component Value Date   ALT 20 09/01/2015   AST 20 09/01/2015   ALKPHOS 63 09/01/2015   BILITOT 0.5 09/01/2015     RADIOGRAPHY: No results found.    IMPRESSION: 65 y.o. gentleman with a Stage IIB, T1, N1 adenocarcinoma of the body of the pancreas.  PLAN:  We discussed the patient's diagnosis and treatments of his pancreatic cancer. We reviewed the rationale for considering concurrent chemotherapy and radiotherapy. He has plans to begin chemo on 10/09/15.  We discussed 5 weeks of radiation treatment as an outpatient to the pancreas and abdominal lymph nodes as an outpatient. We discussed possible side effects of treatment. We discussed the risks, benefits, short and long term effects of radiotherapy, and the patient agrees to proceed. CT simulation is scheduled 09/28/15 at 1PM.The patient will be referred to nutrition regarding his  weight loss.  The above documentation reflects my direct findings during this shared patient visit. Please see the separate note by Dr. Tammi Klippel on this date for the remainder of the patient's plan of care.   Carola Rhine, PAC  This document serves as a record of services personally performed by Shona Simpson, PA-C and Tyler Pita, MD. It was created on their behalf by Darcus Austin, a trained medical scribe. The creation  of this record is based on the scribe's personal observations and the providers' statements to them. This document has been checked and approved by the attending provider.

## 2015-09-26 ENCOUNTER — Other Ambulatory Visit: Payer: Self-pay | Admitting: General Surgery

## 2015-09-26 ENCOUNTER — Ambulatory Visit (HOSPITAL_BASED_OUTPATIENT_CLINIC_OR_DEPARTMENT_OTHER): Payer: Managed Care, Other (non HMO) | Admitting: Nurse Practitioner

## 2015-09-26 VITALS — BP 138/69 | HR 87 | Temp 98.4°F | Resp 17 | Ht 69.0 in | Wt 150.7 lb

## 2015-09-26 DIAGNOSIS — C251 Malignant neoplasm of body of pancreas: Secondary | ICD-10-CM

## 2015-09-26 DIAGNOSIS — J449 Chronic obstructive pulmonary disease, unspecified: Secondary | ICD-10-CM

## 2015-09-26 DIAGNOSIS — Z720 Tobacco use: Secondary | ICD-10-CM

## 2015-09-26 DIAGNOSIS — I10 Essential (primary) hypertension: Secondary | ICD-10-CM | POA: Diagnosis not present

## 2015-09-26 NOTE — Progress Notes (Addendum)
  [place] OFFICE PROGRESS NOTE   Diagnosis:  Pancreas cancer  INTERVAL HISTORY:   Perry Robinson returns as scheduled. He denies nausea/vomiting. No diarrhea. No abdominal pain. He reports drinking 3-4 beers a day.   Objective:  Vital signs in last 24 hours:  Blood pressure 138/69, pulse 87, temperature 98.4 F (36.9 C), temperature source Oral, resp. rate 17, height 5\' 9"  (1.753 m), weight 150 lb 11.2 oz (68.4 kg), SpO2 100 %.    HEENT: No thrush or ulcers. Resp: Lungs clear bilaterally. Cardio: Regular rate and rhythm. GI: Abdomen soft and nontender. No hepatomegaly. Well-healed midline incision. Vascular: No leg edema.    Lab Results:  Lab Results  Component Value Date   WBC 9.2 09/01/2015   HGB 13.8 09/01/2015   HCT 41.4 09/01/2015   MCV 95.2 09/01/2015   PLT 335 09/01/2015   NEUTROABS 2.7 08/22/2015    Imaging:  No results found.  Medications: I have reviewed the patient's current medications.  Assessment/Plan: 1. Adenocarcinoma of the pancreas body, clinical stage IA (T1 N0); pathologic stage T1, N1 ? EUS 06/28/2015 revealed no evidence of vascular invasion or lymphadenopathy, FNA biopsy of the pancreas body mass confirmed adenocarcinoma ? No evidence for metastatic disease on a staging chest CT 07/04/2015 ? [facility] CT 06/07/2015 confirmed a pancreas body mass, questionable extension to the superior mesenteric artery ? Elevated CA 19-9 ? Status post open distal subtotal pancreatectomy and splenectomy 08/27/2015. Pathology showed invasive moderately differentiated adenocarcinoma spanning 1.8 cm in greatest dimension. Associated high-grade pancreatic intraepithelial neoplasia. Background chronic pancreatitis. Margins negative. 3 of 11 lymph nodes positive for metastatic adenocarcinoma. Benign spleen. pT1, pN1   2. Prostate cancer in 2006, status post a prostatectomy  3. COPD/ongoing tobacco use  4.  Hypertension   Disposition:Perry Robinson appears stable. At the time of his last visit Dr. Benay Spice recommended adjuvant chemotherapy with gemcitabine and Xeloda. The gemcitabine is to be given on a day 1, day 8, day 15 schedule of a 28 day cycle; Xeloda given days 1 through 21 of a 28 day cycle.  Since Perry Robinson last visit he was seen in radiation oncology. Radiation has also been recommended.  We reviewed the treatment schedule with Perry Robinson at today's visit. Dr. Benay Spice recommends 2 cycles of gemcitabine/Xeloda as outlined above; followed by radiation/Xeloda; then 2 additional cycles of gemcitabine/Xeloda as outlined above. He understands the entire course of treatment will be approximately 6 months. We again reviewed potential toxicities associated with the chemotherapy. He has attended the chemotherapy education class. He states that he is agreeable to proceed.  He reports Dr. Barry Robinson will be placing a Port-A-Cath.  We recommended that he decrease alcohol consumption to no more than one beer a day.  He will return for a follow-up visit and initiation of chemotherapy on 10/09/2015. He will contact the office in the interim with any problems.  Patient seen with Dr. Benay Spice. 30 minutes were spent face-to-face at today's visit with the majority of that time involved in counseling/coordination of care.    Ned Card ANP/GNP-BC   09/26/2015  1:02 PM  This was a shared visit with Ned Card. We discussed adjuvant therapy in detail with Perry Robinson. We reviewed the plan for gemcitabine/capecitabine to be followed by capecitabine/radiation and then additional gemcitabine/capecitabine. He agrees to proceed.  He will return for an office visit and the first treatment with chemotherapy on 10/09/2015.  [name], M.D.

## 2015-09-27 NOTE — Progress Notes (Signed)
  Radiation Oncology         [phone] ________________________________  Name: Perry Robinson MRN: [id number]  Date: 09/28/2015  DOB: 07/26/1950  SIMULATION AND TREATMENT PLANNING NOTE  DIAGNOSIS:     ICD-9-CM ICD-10-CM   1. Carcinoma of body of pancreas ([place]) 157.1 C25.1      CONSENT VERIFIED: yes   SET UP: Patient is set-up supine   IMMOBILIZATION: The following immobilization is used: alpha-cradle. This complex treatment device will be used on a daily basis during the patient's treatment.   Diagnosis: Pancreatic cancer   NARRATIVE: The patient was brought to the [place]. Identity was confirmed. All relevant records and images related to the planned course of therapy were reviewed. Then, the patient was positioned in a stable reproducible clinical set-up for radiation therapy using a customized Vac-lock bag. Skin markings were placed. The CT images were loaded into the planning software where the target and avoidance structures were contoured.The radiation prescription was entered and confirmed.   The patient will receive 50.4 Gy in 28 fractions.   Daily image guidance is ordered, and this will be used on a daily basis. This is necessary to ensure accurate and precise localization of the target in addition to accurate alignment of the normal tissue structures in this region.   Treatment planning then occurred.   I have requested : Intensity Modulated Radiotherapy (IMRT) is medically necessary for this case for the following reason: Dose homogeneity; the target is in close proximity to critical normal structures, including the kidneys and the liver. IMRT is thus medically to appropriately treat the patient.   Special treatment procedure  The patient will receive chemotherapy during the course of radiation treatment. The patient may experience increased or overlapping toxicity due to this combined-modality approach and the patient will be monitored for such  problems. This may include extra lab work as necessary. This therefore constitutes a special treatment procedure.    ------------------------------------------------   Tyler Pita, MD [place] Director and Director of Stereotactic Radiosurgery Direct Dial: [phone]  Fax: [phone] [email or web address]  Skype  [email or web address]

## 2015-09-28 ENCOUNTER — Telehealth: Payer: Self-pay | Admitting: Oncology

## 2015-09-28 ENCOUNTER — Encounter: Payer: Self-pay | Admitting: Pharmacist

## 2015-09-28 ENCOUNTER — Ambulatory Visit
Admission: RE | Admit: 2015-09-28 | Discharge: 2015-09-28 | Disposition: A | Discharge status: Home / Self Care | Payer: Managed Care, Other (non HMO) | Source: Ambulatory Visit | Attending: Radiation Oncology | Admitting: Radiation Oncology

## 2015-09-28 ENCOUNTER — Telehealth: Payer: Self-pay | Admitting: *Deleted

## 2015-09-28 DIAGNOSIS — Z510 Encounter for antineoplastic radiation therapy: Secondary | ICD-10-CM | POA: Diagnosis not present

## 2015-09-28 DIAGNOSIS — C251 Malignant neoplasm of body of pancreas: Secondary | ICD-10-CM

## 2015-09-28 MED FILL — XELODA 500 MG TABLET: 500 | 28 days supply | Qty: 126 | Fill #0

## 2015-09-28 NOTE — Telephone Encounter (Signed)
per pof to sch pt appt-gave pt copy of avs °

## 2015-09-28 NOTE — Telephone Encounter (Signed)
I have adjusted 8/22 

## 2015-09-28 NOTE — Telephone Encounter (Signed)
per pof to sch pt appt-sent MW email to sch 8/22 to see if she could find earlier inf-pt to get updated copy on 8/8

## 2015-09-28 NOTE — Progress Notes (Signed)
Oral Chemotherapy Pharmacist Encounter  Received prescription for Xeloda 1500mg  PO in am and 1500mg  PO in pm for 3000mg  total (1660mg /m2/d) to be used concurrently with gemcitabine. Labs from 7/1 ok and no DDI's noted. Will fax Rx to [facility] to see if they can fill.  Johny Drilling, PharmD, BCPS Oral Chemotherapy Clinic

## 2015-10-01 ENCOUNTER — Telehealth: Payer: Self-pay | Admitting: *Deleted

## 2015-10-01 DIAGNOSIS — Z510 Encounter for antineoplastic radiation therapy: Secondary | ICD-10-CM | POA: Diagnosis not present

## 2015-10-01 NOTE — Telephone Encounter (Signed)
Oncology Nurse Navigator Documentation  Oncology Nurse Navigator Flowsheets 10/01/2015  Navigator Location [facility]  Navigator Encounter Type Telephone  Telephone Outgoing Call  Abnormal Finding Date -  Confirmed Diagnosis Date -  Patient Visit Type -  Treatment Phase -  Barriers/Navigation Needs Coordination of Care--PAC per [name]  Education -  Interventions Coordination of Care-spoke with scheduling at [facility]-they have called patient and left message X 2 without return call. This RN called him and left VM to please call [facility] and ask for [place]. Left navigator name/phone # to call for questions or concerns.  Coordination of Care -  Education Method -  Support Groups/Services -  Acuity Level 1  Time Spent with Patient 15

## 2015-10-03 ENCOUNTER — Ambulatory Visit (HOSPITAL_COMMUNITY)
Admission: RE | Admit: 2015-10-03 | Discharge: 2015-10-03 | Disposition: A | Discharge status: Home / Self Care | Payer: Managed Care, Other (non HMO) | Source: Ambulatory Visit | Attending: General Surgery | Admitting: General Surgery

## 2015-10-03 ENCOUNTER — Encounter (HOSPITAL_BASED_OUTPATIENT_CLINIC_OR_DEPARTMENT_OTHER)
Admission: RE | Disposition: A | Discharge status: Home / Self Care | Payer: Self-pay | Source: Ambulatory Visit | Attending: General Surgery

## 2015-10-03 ENCOUNTER — Encounter (HOSPITAL_BASED_OUTPATIENT_CLINIC_OR_DEPARTMENT_OTHER): Payer: Self-pay | Admitting: Anesthesiology

## 2015-10-03 SURGERY — INSERTION, TUNNELED CENTRAL VENOUS DEVICE, WITH PORT
Anesthesia: General

## 2015-10-03 MED ORDER — LIDOCAINE-EPINEPHRINE (PF) 1 %-1:200000 IJ SOLN
INTRAMUSCULAR | Status: AC
  Filled 2015-10-03: qty 30

## 2015-10-03 MED ORDER — HEPARIN (PORCINE) IN NACL 2-0.9 UNIT/ML-% IJ SOLN
INTRAMUSCULAR | Status: AC
Start: 2015-10-03 — End: 2015-10-03
  Filled 2015-10-03: qty 500

## 2015-10-03 MED ORDER — BUPIVACAINE HCL (PF) 0.25 % IJ SOLN
INTRAMUSCULAR | Status: AC
  Filled 2015-10-03: qty 30

## 2015-10-03 MED ORDER — BUPIVACAINE-EPINEPHRINE (PF) 0.5% -1:200000 IJ SOLN
INTRAMUSCULAR | Status: AC
  Filled 2015-10-03: qty 30

## 2015-10-03 MED ORDER — HEPARIN SOD (PORK) LOCK FLUSH 100 UNIT/ML IV SOLN
INTRAVENOUS | Status: AC
  Filled 2015-10-03: qty 5

## 2015-10-03 NOTE — Anesthesia Preprocedure Evaluation (Deleted)
Anesthesia Evaluation  Patient identified by MRN, date of birth, ID band Patient awake    Reviewed: Allergy & Precautions, NPO status , Patient's Chart, lab work & pertinent test results  Airway Mallampati: II       Dental  (+) Teeth Intact   Pulmonary Current Smoker,    breath sounds clear to auscultation       Cardiovascular hypertension,  Rhythm:Regular Rate:Normal     Neuro/Psych    GI/Hepatic GERD  ,Adeno of pancreas   Endo/Other    Renal/GU      Musculoskeletal   Abdominal   Peds  Hematology   Anesthesia Other Findings   Reproductive/Obstetrics                             Anesthesia Physical  Anesthesia Plan  ASA: III  Anesthesia Plan: General   Post-op Pain Management:    Induction: Intravenous  Airway Management Planned: LMA  Additional Equipment:   Intra-op Plan:   Post-operative Plan: Extubation in OR  Informed Consent: I have reviewed the patients History and Physical, chart, labs and discussed the procedure including the risks, benefits and alternatives for the proposed anesthesia with the patient or authorized representative who has indicated his/her understanding and acceptance.     Plan Discussed with:   Anesthesia Plan Comments: (Pancreatic cancer)        Anesthesia Quick Evaluation

## 2015-10-04 ENCOUNTER — Encounter: Payer: Self-pay | Admitting: Oncology

## 2015-10-04 NOTE — Progress Notes (Signed)
Left msg for pt to return my call to discuss copay assistance w/ [facility] & to discuss the [place].

## 2015-10-05 ENCOUNTER — Encounter: Payer: Self-pay | Admitting: Pharmacist

## 2015-10-05 NOTE — Progress Notes (Signed)
Oral Chemotherapy Pharmacist Encounter   Received notification from [facility] that patient's Xeloda Rx was approved with $0 co-pay and they will cal patient to pick up Rx. Oral Chemo Clinic will f/u with patient at infusion appointment on 8/8 for initial counseling.  Noted that patient was scheduled for gemcitabine starting 8/8, and also had radiation scheduled to start 8/9. Due to radiosensitizing nature of gemcitabine, recommendation is to have separation from it's administration and radiation.  Case discussed with Dr. Benay Spice, patient not supposed to start radiation while on gemcitabine. He will have Lavella Lemons, RN get in touch with Dr. Lisbeth Renshaw to have schedule moved.   Oral chemo clinic will follow-up  Johny Drilling, PharmD, BCPS Oral Chemotherapy Clinic

## 2015-10-07 ENCOUNTER — Other Ambulatory Visit: Payer: Self-pay | Admitting: Oncology

## 2015-10-09 ENCOUNTER — Ambulatory Visit (HOSPITAL_BASED_OUTPATIENT_CLINIC_OR_DEPARTMENT_OTHER): Payer: Managed Care, Other (non HMO) | Admitting: Oncology

## 2015-10-09 ENCOUNTER — Encounter: Payer: Self-pay | Admitting: *Deleted

## 2015-10-09 ENCOUNTER — Ambulatory Visit
Admission: RE | Admit: 2015-10-09 | Discharge: 2015-10-09 | Disposition: A | Discharge status: Home / Self Care | Payer: Managed Care, Other (non HMO) | Source: Ambulatory Visit | Attending: Radiation Oncology | Admitting: Radiation Oncology

## 2015-10-09 ENCOUNTER — Ambulatory Visit: Payer: Managed Care, Other (non HMO)

## 2015-10-09 ENCOUNTER — Other Ambulatory Visit (HOSPITAL_BASED_OUTPATIENT_CLINIC_OR_DEPARTMENT_OTHER): Payer: Managed Care, Other (non HMO)

## 2015-10-09 VITALS — BP 138/68 | HR 82 | Temp 98.6°F | Resp 18 | Ht 69.0 in | Wt 148.1 lb

## 2015-10-09 DIAGNOSIS — Z720 Tobacco use: Secondary | ICD-10-CM

## 2015-10-09 DIAGNOSIS — R109 Unspecified abdominal pain: Secondary | ICD-10-CM | POA: Diagnosis not present

## 2015-10-09 DIAGNOSIS — C61 Malignant neoplasm of prostate: Secondary | ICD-10-CM

## 2015-10-09 DIAGNOSIS — Z8546 Personal history of malignant neoplasm of prostate: Secondary | ICD-10-CM

## 2015-10-09 DIAGNOSIS — C251 Malignant neoplasm of body of pancreas: Secondary | ICD-10-CM | POA: Diagnosis not present

## 2015-10-09 DIAGNOSIS — I10 Essential (primary) hypertension: Secondary | ICD-10-CM | POA: Diagnosis not present

## 2015-10-09 DIAGNOSIS — J449 Chronic obstructive pulmonary disease, unspecified: Secondary | ICD-10-CM

## 2015-10-09 DIAGNOSIS — Z510 Encounter for antineoplastic radiation therapy: Secondary | ICD-10-CM | POA: Diagnosis not present

## 2015-10-09 LAB — COMPREHENSIVE METABOLIC PANEL
ALBUMIN: 3.7 g/dL (ref 3.5–5.0)
ALK PHOS: 94 U/L (ref 40–150)
ALT: 17 U/L (ref 0–55)
AST: 23 U/L (ref 5–34)
Anion Gap: 10 mEq/L (ref 3–11)
BILIRUBIN TOTAL: 0.36 mg/dL (ref 0.20–1.20)
BUN: 11 mg/dL (ref 7.0–26.0)
CO2: 25 mEq/L (ref 22–29)
Calcium: 9.6 mg/dL (ref 8.4–10.4)
Chloride: 105 mEq/L (ref 98–109)
Creatinine: 0.7 mg/dL (ref 0.7–1.3)
EGFR: >90 mL/min/{1.73_m2} (ref >90)
GLUCOSE: 167 mg/dL — AB (ref 70–140)
Potassium: 3.7 mEq/L (ref 3.5–5.1)
SODIUM: 140 meq/L (ref 136–145)
TOTAL PROTEIN: 7.1 g/dL (ref 6.4–8.3)

## 2015-10-09 LAB — CBC WITH DIFFERENTIAL/PLATELET
BASO%: 1.8 % (ref 0.0–2.0)
BASOS ABS: 0.1 10*3/uL (ref 0.0–0.1)
EOS ABS: 0.1 10*3/uL (ref 0.0–0.5)
EOS%: 2.3 % (ref 0.0–7.0)
HCT: 41.4 % (ref 38.4–49.9)
HEMOGLOBIN: 13.8 g/dL (ref 13.0–17.1)
LYMPH%: 47.7 % (ref 14.0–49.0)
MCH: 31.3 pg (ref 27.2–33.4)
MCHC: 33.3 g/dL (ref 32.0–36.0)
MCV: 93.9 fL (ref 79.3–98.0)
MONO#: 0.3 10*3/uL (ref 0.1–0.9)
MONO%: 7.5 % (ref 0.0–14.0)
NEUT%: 40.7 % (ref 39.0–75.0)
NEUTROS ABS: 1.8 10*3/uL (ref 1.5–6.5)
PLATELETS: 330 10*3/uL (ref 140–400)
RBC: 4.41 10*6/uL (ref 4.20–5.82)
RDW: 14.8 % — AB (ref 11.0–14.6)
WBC: 4.4 10*3/uL (ref 4.0–10.3)
lymph#: 2.1 10*3/uL (ref 0.9–3.3)

## 2015-10-09 MED ORDER — CAPECITABINE 500 MG PO TABS: ORAL_TABLET | ORAL | 0 refills | Status: DC

## 2015-10-09 NOTE — Progress Notes (Signed)
  [facility] [place] OFFICE PROGRESS NOTE   Diagnosis: Pancreas cancer  INTERVAL HISTORY:   Perry Robinson returns as scheduled. He has mild abdominal discomfort. No diarrhea. Good appetite. He has decided against IV chemotherapy. He declined placement of a Port-A-Cath. He is scheduled to begin adjuvant radiation today.  Objective:  Vital signs in last 24 hours:  Blood pressure 138/68, pulse 82, temperature 98.6 F (37 C), temperature source Oral, resp. rate 18, height 5\' 9"  (1.753 m), weight 148 lb 1.6 oz (67.2 kg), SpO2 100 %.   Resp: Lungs clear bilaterally Cardio: Regular rate and rhythm GI: No hepatosplenomegaly, healed surgical incision, mild fullness to the right side of the upper midline scar, mild tenderness in the left upper abdomen, no mass Vascular: No leg edema   Lab Results:  Lab Results  Component Value Date   WBC 4.4 10/09/2015   HGB 13.8 10/09/2015   HCT 41.4 10/09/2015   MCV 93.9 10/09/2015   PLT 330 10/09/2015   NEUTROABS 1.8 10/09/2015     Medications: I have reviewed the patient's current medications.  Assessment/Plan: 1. Adenocarcinoma of the pancreas body, clinical stage IA (T1 N0); pathologic stage T1, N1 ? EUS 06/28/2015 revealed no evidence of vascular invasion or lymphadenopathy, FNA biopsy of the pancreas body mass confirmed adenocarcinoma ? No evidence for metastatic disease on a staging chest CT 07/04/2015 ? [facility] CT 06/07/2015 confirmed a pancreas body mass, questionable extension to the superior mesenteric artery ? Elevated CA 19-9 ? Status post open distal subtotal pancreatectomy and splenectomy 08/27/2015. Pathology showed invasive moderately differentiated adenocarcinoma spanning 1.8 cm in greatest dimension. Associated high-grade pancreatic intraepithelial neoplasia. Background chronic pancreatitis. Margins negative. 3 of 11 lymph nodes positive for metastatic adenocarcinoma. Benign spleen. pT1, pN1 ? Initiation of  adjuvant Xeloda and radiation 10/09/2015   2. Prostate cancer in 2006, status post a prostatectomy  3. COPD/ongoing tobacco use  4. Hypertension   Disposition: Perry Robinson appears stable to begin adjuvant therapy. We discussed the potential benefit associated with adjuvant chemotherapy. He declines gemcitabine.  He agrees to adjuvant Xeloda/radiation. This will start today. We reviewed the potential toxicities associated with Xeloda including the chance for mucositis, diarrhea, alopecia, and hematologic toxicity. We discussed the hyperpigmentation, sun sensitivity, and hand/foot syndrome associated with Xeloda.  Perry Robinson will return for an office visit in 2 weeks. He will discontinue Xeloda and contact us if he develops diarrhea. We will see him in the interim as needed.   Betsy Coder, MD  10/09/2015  10:52 AM

## 2015-10-09 NOTE — Progress Notes (Signed)
Oncology Nurse Navigator Documentation  Oncology Nurse Navigator Flowsheets 10/09/2015  Navigator Location [facility]  Navigator Encounter Type Follow-up Appt  Telephone -  Abnormal Finding Date -  Confirmed Diagnosis Date -  Surgery Date 08/27/2015  Treatment Initiated Date 10/09/2015  Patient Visit Type MedOnc  Treatment Phase First Radiation Tx;First Chemo Tx--RT/Xeloda  Barriers/Navigation Needs Education  Education Understanding Cancer/ Treatment Options;Coping with Diagnosis/ Prognosis  Interventions Education Method  Coordination of Care -  Education Method Verbal;Teach-back  Support Groups/Services -  Acuity Level 2  Time Spent with Patient 30  Patient remains very angry with loud verbalizations and pressured speech in regards to the treatment plan and his cancer. He refuses to do IV chemo and refused his PAC. Tells RN he will only take one Xeloda twice daily, thinking 3000 mg/day is too much medicine to take. Educated him that this is standard dosing proven effective with the radiation and dose is based on his height and weight. Encouraged him to at least try the correct dosing and if it makes him feel "terrible" then stop taking it and call office. Informed him we want him to have best chance at a long remission and to live a long time. He tells RN that he only needs to live till his driver's license expires and he doesn't need to live any longer than that. Escorted him to radiation oncology along with his sister after visit with Dr. Benay Spice. Adjusted appointment calendar to remove all infusion appointments and keep lab/OV on 10/23/15.

## 2015-10-10 ENCOUNTER — Ambulatory Visit
Admission: RE | Admit: 2015-10-10 | Discharge: 2015-10-10 | Disposition: A | Discharge status: Home / Self Care | Payer: Managed Care, Other (non HMO) | Source: Ambulatory Visit | Attending: Radiation Oncology | Admitting: Radiation Oncology

## 2015-10-10 DIAGNOSIS — Z510 Encounter for antineoplastic radiation therapy: Secondary | ICD-10-CM | POA: Diagnosis not present

## 2015-10-10 LAB — CANCER ANTIGEN 19-9: CAN 19-9: 39 U/mL — AB (ref 0–35)

## 2015-10-11 ENCOUNTER — Ambulatory Visit
Admission: RE | Admit: 2015-10-11 | Discharge: 2015-10-11 | Disposition: A | Discharge status: Home / Self Care | Payer: Managed Care, Other (non HMO) | Source: Ambulatory Visit | Attending: Radiation Oncology | Admitting: Radiation Oncology

## 2015-10-11 DIAGNOSIS — Z510 Encounter for antineoplastic radiation therapy: Secondary | ICD-10-CM | POA: Diagnosis not present

## 2015-10-12 ENCOUNTER — Ambulatory Visit
Admission: RE | Admit: 2015-10-12 | Discharge: 2015-10-12 | Disposition: A | Discharge status: Home / Self Care | Payer: Medicare Other | Source: Ambulatory Visit | Attending: Radiation Oncology | Admitting: Radiation Oncology

## 2015-10-12 ENCOUNTER — Ambulatory Visit
Admission: RE | Admit: 2015-10-12 | Discharge: 2015-10-12 | Disposition: A | Discharge status: Home / Self Care | Payer: Managed Care, Other (non HMO) | Source: Ambulatory Visit | Attending: Radiation Oncology | Admitting: Radiation Oncology

## 2015-10-12 ENCOUNTER — Encounter: Payer: Self-pay | Admitting: Radiation Oncology

## 2015-10-12 ENCOUNTER — Encounter: Payer: Self-pay | Admitting: Oncology

## 2015-10-12 VITALS — BP 120/72 | HR 86 | Resp 16 | Wt 147.4 lb

## 2015-10-12 DIAGNOSIS — C251 Malignant neoplasm of body of pancreas: Secondary | ICD-10-CM

## 2015-10-12 DIAGNOSIS — Z510 Encounter for antineoplastic radiation therapy: Secondary | ICD-10-CM | POA: Diagnosis not present

## 2015-10-12 NOTE — Progress Notes (Signed)
   Department of Radiation Oncology  Phone:  [phone] Fax:        [phone]  Weekly Treatment Note    Name: JLON QUERRY Date: 10/14/2015 MRN: [id number] DOB: [birth year]   Diagnosis:     ICD-9-CM ICD-10-CM   1. Cancer of pancreas, body ([place]) 157.1 C25.1   2. Carcinoma of body of pancreas ([place]) 157.1 C25.1      Current dose: 7.2 Gy  Current fraction: 4   MEDICATIONS: Current Outpatient Prescriptions  Medication Sig Dispense Refill  . capecitabine (XELODA) 500 MG tablet 1500 mg twice daily on days of radiation only 120 tablet 0  . lisinopril (PRINIVIL,ZESTRIL) 20 MG tablet Take 1 tablet (20 mg total) by mouth daily. 90 tablet 1  . lidocaine-prilocaine (EMLA) cream Apply to portacath site 1 hour prior to being accessed (Patient not taking: Reported on 09/24/2015) 30 g 2  . oxyCODONE-acetaminophen (ROXICET) 5-325 MG tablet Take 1-2 tablets by mouth every 4 (four) hours as needed. (Patient not taking: Reported on 09/18/2015) 30 tablet 0  . prochlorperazine (COMPAZINE) 10 MG tablet Take 1 tablet (10 mg total) by mouth every 6 (six) hours as needed for nausea or vomiting. (Patient not taking: Reported on 09/24/2015) 30 tablet 2   No current facility-administered medications for this encounter.      ALLERGIES: Review of patient's allergies indicates no known allergies.   LABORATORY DATA:  Lab Results  Component Value Date   WBC 4.4 10/09/2015   HGB 13.8 10/09/2015   HCT 41.4 10/09/2015   MCV 93.9 10/09/2015   PLT 330 10/09/2015   Lab Results  Component Value Date   NA 140 10/09/2015   K 3.7 10/09/2015   CL 104 09/01/2015   CO2 25 10/09/2015   Lab Results  Component Value Date   ALT 17 10/09/2015   AST 23 10/09/2015   ALKPHOS 94 10/09/2015   BILITOT 0.36 10/09/2015     NARRATIVE: BERNY AGAR was seen today for weekly treatment management. The chart was checked and the patient's films were reviewed.  Weight and vitals stable. Denies pain.  Reports tenderness at hernia repair site in the low abdomen. Reports nausea and vomiting on Tuesday. Reports diarrhea on Tuesday. Reports nausea, vomiting, and diarrhea resolved once he cut his Xeloda from 6 pills per day to 4 pills per day. He spoke with a friend at [facility] who encouraged this decrease in dose. He has not spoke with medical oncology about this change in dosing. Reports his bowels have returned to normal consistency. Reports mild fatigue.  PHYSICAL EXAMINATION: weight is 147 lb 6.4 oz (66.9 kg). His blood pressure is 120/72 and his pulse is 86. His respiration is 16 and oxygen saturation is 100%.      Alert, in no acute distress.  ASSESSMENT: The patient is doing satisfactorily with treatment.  PLAN: We will continue with the patient's radiation treatment as planned. I will send a note to medical oncology to update them on Mr. Scheidecker' decrease in Xeloda dosing.    This document serves as a record of services personally performed by Kyung Rudd, MD. It was created on his behalf by Arlyce Harman, a trained medical scribe. The creation of this record is based on the scribe's personal observations and the provider's statements to them. This document has been checked and approved by the attending provider.  ------------------------------------------------  Jodelle Gross, MD, PhD

## 2015-10-12 NOTE — Progress Notes (Addendum)
Weight and vitals stable. Denies pain. Reports tenderness at hernia repair site low abdomen. Reports nausea and vomiting on Tuesday. Reports diarrhea on Tuesday. Reports nausea, vomiting, and diarrhea resolved once he cut his xeloda from 6 pills per day to 4 pills per day. Reports his bowels have returned to normal consistency. Reports mild fatigue. Patient scheduled to follow up with [name] on 10/26/15. Patient reports his short term disability runs out on 10/26/15, as well. Patient refused post sim education.   BP 120/72   Pulse 86   Resp 16   Wt 147 lb 6.4 oz (66.9 kg)   SpO2 100%   BMI 21.77 kg/m  Wt Readings from Last 3 Encounters:  10/12/15 147 lb 6.4 oz (66.9 kg)  10/09/15 148 lb 1.6 oz (67.2 kg)  09/26/15 150 lb 11.2 oz (68.4 kg)

## 2015-10-12 NOTE — Progress Notes (Signed)
Spoke to pt regarding copay assistance, he informed me he canceled that treatment so no need to apply to [facility].  I also offered the [place] to help w/ gas since he's coming everyday for radiation, he declined.

## 2015-10-14 ENCOUNTER — Other Ambulatory Visit: Payer: Self-pay | Admitting: Oncology

## 2015-10-15 ENCOUNTER — Telehealth: Payer: Self-pay | Admitting: Pharmacist

## 2015-10-15 ENCOUNTER — Ambulatory Visit
Admission: RE | Admit: 2015-10-15 | Discharge: 2015-10-15 | Disposition: A | Discharge status: Home / Self Care | Payer: Managed Care, Other (non HMO) | Source: Ambulatory Visit | Attending: Radiation Oncology | Admitting: Radiation Oncology

## 2015-10-15 DIAGNOSIS — Z510 Encounter for antineoplastic radiation therapy: Secondary | ICD-10-CM | POA: Diagnosis not present

## 2015-10-15 NOTE — Telephone Encounter (Signed)
Oral Chemotherapy Pharmacist Encounter  Date 10/15/15: Attempted to reach patient for follow up on oral medication: Xeloda.  No answer. Left VM for patient to call back with any questions or issues.   Thank you,  Johny Drilling, PharmD, [place] [facility] [phone]

## 2015-10-16 ENCOUNTER — Ambulatory Visit: Payer: Managed Care, Other (non HMO) | Admitting: Nurse Practitioner

## 2015-10-16 ENCOUNTER — Ambulatory Visit
Admission: RE | Admit: 2015-10-16 | Discharge: 2015-10-16 | Disposition: A | Discharge status: Home / Self Care | Payer: Managed Care, Other (non HMO) | Source: Ambulatory Visit | Attending: Radiation Oncology | Admitting: Radiation Oncology

## 2015-10-16 ENCOUNTER — Ambulatory Visit: Payer: Managed Care, Other (non HMO)

## 2015-10-16 ENCOUNTER — Other Ambulatory Visit: Payer: Managed Care, Other (non HMO)

## 2015-10-16 DIAGNOSIS — Z510 Encounter for antineoplastic radiation therapy: Secondary | ICD-10-CM | POA: Diagnosis not present

## 2015-10-17 ENCOUNTER — Ambulatory Visit
Admission: RE | Admit: 2015-10-17 | Discharge: 2015-10-17 | Disposition: A | Discharge status: Home / Self Care | Payer: Managed Care, Other (non HMO) | Source: Ambulatory Visit | Attending: Radiation Oncology | Admitting: Radiation Oncology

## 2015-10-17 DIAGNOSIS — Z510 Encounter for antineoplastic radiation therapy: Secondary | ICD-10-CM | POA: Diagnosis not present

## 2015-10-18 ENCOUNTER — Ambulatory Visit
Admission: RE | Admit: 2015-10-18 | Discharge: 2015-10-18 | Disposition: A | Discharge status: Home / Self Care | Payer: Managed Care, Other (non HMO) | Source: Ambulatory Visit | Attending: Radiation Oncology | Admitting: Radiation Oncology

## 2015-10-18 DIAGNOSIS — Z510 Encounter for antineoplastic radiation therapy: Secondary | ICD-10-CM | POA: Diagnosis not present

## 2015-10-19 ENCOUNTER — Ambulatory Visit
Admission: RE | Admit: 2015-10-19 | Discharge: 2015-10-19 | Disposition: A | Discharge status: Home / Self Care | Payer: Managed Care, Other (non HMO) | Source: Ambulatory Visit | Attending: Radiation Oncology | Admitting: Radiation Oncology

## 2015-10-19 VITALS — BP 118/68 | HR 79 | Resp 16 | Wt 144.2 lb

## 2015-10-19 DIAGNOSIS — Z510 Encounter for antineoplastic radiation therapy: Secondary | ICD-10-CM | POA: Diagnosis not present

## 2015-10-19 DIAGNOSIS — C61 Malignant neoplasm of prostate: Secondary | ICD-10-CM

## 2015-10-19 NOTE — Progress Notes (Signed)
  Radiation Oncology         [id number]   Name: Perry Robinson MRN: [id number]   Date: 10/19/2015  DOB: [birth year]     Weekly Radiation Therapy Management    ICD-9-CM ICD-10-CM   1. Prostate cancer ([place]) 185 C61     Current Dose: 16.2 Gy  Planned Dose:  50.4 Gy  Narrative The patient presents for routine under treatment assessment.  Vitals stable. 3 lbs weight loss noted. Reports eating "like a horse". Denies nausea, vomiting, or diarrhea. Reports he remains active without fatigue. Denies skin changes within treatment field. Patient is without complaints. Offered nutrition consult but, patient refused.    The patient is without complaint. Set-up films were reviewed. The chart was checked.  Physical Findings  weight is 144 lb 3.2 oz (65.4 kg). His blood pressure is 118/68 and his pulse is 79. His respiration is 16 and oxygen saturation is 100%. . Weight loss noted.  No significant changes. Alert, in no acute distress.   Impression The patient is tolerating radiation.  Plan Continue treatment as planned.         Sheral Apley Tammi Klippel, M.D.  This document serves as a record of services personally performed by Tyler Pita, MD. It was created on his behalf by Arlyce Harman, a trained medical scribe. The creation of this record is based on the scribe's personal observations and the provider's statements to them. This document has been checked and approved by the attending provider.

## 2015-10-19 NOTE — Progress Notes (Signed)
Vitals stable. Weight loss noted. Reports eating "like a horse." Denies nausea, vomiting, or diarrhea. Reports he remains active without fatigue. Denies skin changes within treatment field. Patient without complaints. Offered nutrition consult but, patient refused.  BP 118/68   Pulse 79   Resp 16   Wt 144 lb 3.2 oz (65.4 kg)   SpO2 100%   BMI 21.29 kg/m   Wt Readings from Last 3 Encounters:  10/19/15 144 lb 3.2 oz (65.4 kg)  10/12/15 147 lb 6.4 oz (66.9 kg)  10/09/15 148 lb 1.6 oz (67.2 kg)

## 2015-10-22 ENCOUNTER — Encounter: Payer: Self-pay | Admitting: *Deleted

## 2015-10-22 ENCOUNTER — Ambulatory Visit
Admission: RE | Admit: 2015-10-22 | Discharge: 2015-10-22 | Disposition: A | Discharge status: Home / Self Care | Payer: Managed Care, Other (non HMO) | Source: Ambulatory Visit | Attending: Radiation Oncology | Admitting: Radiation Oncology

## 2015-10-22 DIAGNOSIS — Z510 Encounter for antineoplastic radiation therapy: Secondary | ICD-10-CM | POA: Diagnosis not present

## 2015-10-22 NOTE — Progress Notes (Signed)
Oncology Nurse Navigator Documentation  Oncology Nurse Navigator Flowsheets 10/22/2015  Navigator Location [facility]  Navigator Encounter Type Other  Telephone -  Abnormal Finding Date -  Confirmed Diagnosis Date -  Surgery Date -  Treatment Initiated Date -  Patient Visit Type Walk-in  Treatment Phase Active Tx--RT only. Tells RN "I can't take those pills and I won't".  Barriers/Navigation Needs Coordination of Care--asking if he needs to have labs and see Dr. Benay Spice tomorrow?  Education Other--reminded him that RT works better with Xeloda. Informed him that he does not need to see Dr. Benay Spice or have labs tomorrow, but needs to be seen after the RT is completed to talk about what kind of follow up he needs to monitor in future.  Interventions Other--he tells navigator he will call back after all his tx is completed to discuss a follow up appointment.  Coordination of Care -  Education Method Verbal  Support Groups/Services -  Acuity Level 1  Time Spent with Patient 53  Says he sees [name] on Friday, August 25th and she will free him to go back to work on 8/28. MD notified.

## 2015-10-23 ENCOUNTER — Ambulatory Visit: Payer: Managed Care, Other (non HMO) | Admitting: Oncology

## 2015-10-23 ENCOUNTER — Other Ambulatory Visit: Payer: Managed Care, Other (non HMO)

## 2015-10-23 ENCOUNTER — Ambulatory Visit: Payer: Managed Care, Other (non HMO)

## 2015-10-23 ENCOUNTER — Ambulatory Visit
Admission: RE | Admit: 2015-10-23 | Discharge: 2015-10-23 | Disposition: A | Discharge status: Home / Self Care | Payer: Managed Care, Other (non HMO) | Source: Ambulatory Visit | Attending: Radiation Oncology | Admitting: Radiation Oncology

## 2015-10-23 DIAGNOSIS — Z510 Encounter for antineoplastic radiation therapy: Secondary | ICD-10-CM | POA: Diagnosis not present

## 2015-10-24 ENCOUNTER — Ambulatory Visit
Admission: RE | Admit: 2015-10-24 | Discharge: 2015-10-24 | Disposition: A | Discharge status: Home / Self Care | Payer: Managed Care, Other (non HMO) | Source: Ambulatory Visit | Attending: Radiation Oncology | Admitting: Radiation Oncology

## 2015-10-24 DIAGNOSIS — Z510 Encounter for antineoplastic radiation therapy: Secondary | ICD-10-CM | POA: Diagnosis not present

## 2015-10-25 ENCOUNTER — Encounter: Payer: Self-pay | Admitting: Radiation Oncology

## 2015-10-25 ENCOUNTER — Ambulatory Visit
Admission: RE | Admit: 2015-10-25 | Discharge: 2015-10-25 | Disposition: A | Discharge status: Home / Self Care | Payer: Medicare Other | Source: Ambulatory Visit | Attending: Radiation Oncology | Admitting: Radiation Oncology

## 2015-10-25 ENCOUNTER — Ambulatory Visit
Admission: RE | Admit: 2015-10-25 | Discharge: 2015-10-25 | Disposition: A | Discharge status: Home / Self Care | Payer: Managed Care, Other (non HMO) | Source: Ambulatory Visit | Attending: Radiation Oncology | Admitting: Radiation Oncology

## 2015-10-25 VITALS — BP 118/73 | HR 80 | Temp 98.4°F | Resp 18 | Ht 69.0 in | Wt 143.8 lb

## 2015-10-25 DIAGNOSIS — Z510 Encounter for antineoplastic radiation therapy: Secondary | ICD-10-CM | POA: Diagnosis not present

## 2015-10-25 DIAGNOSIS — C61 Malignant neoplasm of prostate: Secondary | ICD-10-CM

## 2015-10-25 NOTE — Progress Notes (Signed)
Vitals stable. Weight loss noted by one pound. Reports eating "lvery well." Denies nausea or vomiting.  Had one  Diarrhea stool this past week. Reports having fatigue starting last Friday. Denies skin changes within treatment field. Patient without complaints. Offered nutrition consult has not changed his mind still  refusing.  Wt Readings from Last 3 Encounters:  10/25/15 143 lb 12.8 oz (65.2 kg)  10/19/15 144 lb 3.2 oz (65.4 kg)  10/12/15 147 lb 6.4 oz (66.9 kg)  BP 118/73 (BP Location: Left Arm, Patient Position: Sitting, Cuff Size: Normal)   Pulse 80 Comment: 80  Temp 98.4 F (36.9 C) (Oral)   Resp 18   Ht 5\' 9"  (1.753 m)   Wt 143 lb 12.8 oz (65.2 kg)   SpO2 99%   BMI 21.24 kg/m

## 2015-10-25 NOTE — Progress Notes (Signed)
  Radiation Oncology         [phone]   Name: Perry Robinson MRN: [id number]   Date: 10/25/2015  DOB: 1950/07/30     Weekly Radiation Therapy Management    ICD-9-CM ICD-10-CM   1. Prostate cancer ([place]) 185 C61     Current Dose: 23.4 Gy  Planned Dose:  50.4 Gy  Narrative The patient presents for routine under treatment assessment.  Vitals stable. Weight loss noted by one pound. Reports eating "very well". Denies nausea or vomiting. Had one diarrhea stool this past week. Reports having fatigue starting last Friday. Denies skin changes wihtin treatment field. Offered nutrition consult but patient refuses.  The patient is without complaint. Set-up films were reviewed. The chart was checked.  Physical Findings  height is 5\' 9"  (1.753 m) and weight is 143 lb 12.8 oz (65.2 kg). His oral temperature is 98.4 F (36.9 C). His blood pressure is 118/73 and his pulse is 80. His respiration is 18 and oxygen saturation is 99%. . Weight loss noted.  No significant changes. Alert, in no acute distress.   Impression The patient is tolerating radiation.  Plan Continue treatment as planned.         Sheral Apley Tammi Klippel, M.D.  This document serves as a record of services personally performed by Tyler Pita, MD. It was created on his behalf by Bethann Humble, a trained medical scribe. The creation of this record is based on the scribe's personal observations and the provider's statements to them. This document has been checked and approved by the attending provider.

## 2015-10-26 ENCOUNTER — Ambulatory Visit
Admission: RE | Admit: 2015-10-26 | Discharge: 2015-10-26 | Disposition: A | Discharge status: Home / Self Care | Payer: Managed Care, Other (non HMO) | Source: Ambulatory Visit | Attending: Radiation Oncology | Admitting: Radiation Oncology

## 2015-10-26 DIAGNOSIS — Z510 Encounter for antineoplastic radiation therapy: Secondary | ICD-10-CM | POA: Diagnosis not present

## 2015-10-29 ENCOUNTER — Ambulatory Visit
Admission: RE | Admit: 2015-10-29 | Discharge: 2015-10-29 | Disposition: A | Discharge status: Home / Self Care | Payer: Managed Care, Other (non HMO) | Source: Ambulatory Visit | Attending: Radiation Oncology | Admitting: Radiation Oncology

## 2015-10-29 DIAGNOSIS — Z510 Encounter for antineoplastic radiation therapy: Secondary | ICD-10-CM | POA: Diagnosis not present

## 2015-10-30 ENCOUNTER — Telehealth: Payer: Self-pay | Admitting: Radiation Oncology

## 2015-10-30 ENCOUNTER — Telehealth: Payer: Self-pay | Admitting: *Deleted

## 2015-10-30 ENCOUNTER — Ambulatory Visit
Admission: RE | Admit: 2015-10-30 | Discharge: 2015-10-30 | Disposition: A | Discharge status: Home / Self Care | Payer: Managed Care, Other (non HMO) | Source: Ambulatory Visit | Attending: Radiation Oncology | Admitting: Radiation Oncology

## 2015-10-30 DIAGNOSIS — Z510 Encounter for antineoplastic radiation therapy: Secondary | ICD-10-CM | POA: Diagnosis not present

## 2015-10-30 NOTE — Telephone Encounter (Signed)
Return call from [place] in rad onc-form received on 8/28 and forwarded to nurse, Joaquim Lai. She will follow up on status tomorrow. They request that patient allows 5 business days to process forms. Patient notified.

## 2015-10-30 NOTE — Telephone Encounter (Signed)
Oncology Nurse Navigator Documentation  Oncology Nurse Navigator Flowsheets 10/30/2015  Navigator Location [facility]  Navigator Encounter Type Telephone  Telephone FMLA/Disability  Abnormal Finding Date -  Confirmed Diagnosis Date -  Surgery Date -  Treatment Initiated Date -  Patient Visit Type -  Treatment Phase Active Tx--RT only (no xeloda)  Barriers/Navigation Needs Financial--wants to return to work asap. Reports he turned in form to office staff on 10/29/15.  Education -  Interventions Other--left VM for M[place] in rad onc to inquire status of his form  Coordination of Care -  Education Method -  Support Groups/Services -  Acuity -  Time Spent with Patient 15

## 2015-10-30 NOTE — Telephone Encounter (Signed)
Return to work note prepared and placed on Dr. Johny Shears desk to sign. Patient had hernia repair surgery by 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

## 2015-10-31 ENCOUNTER — Ambulatory Visit
Admission: RE | Admit: 2015-10-31 | Discharge: 2015-10-31 | Disposition: A | Discharge status: Home / Self Care | Payer: Managed Care, Other (non HMO) | Source: Ambulatory Visit | Attending: Radiation Oncology | Admitting: Radiation Oncology

## 2015-10-31 ENCOUNTER — Telehealth: Payer: Self-pay | Admitting: Radiation Oncology

## 2015-10-31 DIAGNOSIS — Z510 Encounter for antineoplastic radiation therapy: Secondary | ICD-10-CM | POA: Diagnosis not present

## 2015-10-31 NOTE — Progress Notes (Signed)
Return to work note given to therapist on L4 to provide to patient.

## 2015-10-31 NOTE — Telephone Encounter (Signed)
Mr. Loletha Grayer left a message requesting a return call from this RN about the return work letter he received from this patient. Phoned him back. Reinforced that from a radiation standpoint the patient is free to return to work without restrictions. Mr. Loletha Grayer verbalized understanding.

## 2015-11-01 ENCOUNTER — Ambulatory Visit
Admission: RE | Admit: 2015-11-01 | Discharge: 2015-11-01 | Disposition: A | Discharge status: Home / Self Care | Payer: Managed Care, Other (non HMO) | Source: Ambulatory Visit | Attending: Radiation Oncology | Admitting: Radiation Oncology

## 2015-11-01 VITALS — BP 146/91 | HR 89 | Resp 16 | Wt 143.5 lb

## 2015-11-01 DIAGNOSIS — C61 Malignant neoplasm of prostate: Secondary | ICD-10-CM

## 2015-11-01 DIAGNOSIS — Z510 Encounter for antineoplastic radiation therapy: Secondary | ICD-10-CM | POA: Diagnosis not present

## 2015-11-01 NOTE — Progress Notes (Signed)
Weight and vitals stable. Denies pain. Reports discomfort felt at hernia repair site is much less. Denies nausea, vomiting, diarrhea, or constipation. Reports he is eating "like a horse." Reports he is now able to pass flatus. Reports he mains active despite fatigue. Denies skin changes within treatment field.    BP (!) 146/91   Pulse 89   Resp 16   Wt 143 lb 8 oz (65.1 kg)   SpO2 100%   BMI 21.19 kg/m  Wt Readings from Last 3 Encounters:  11/01/15 143 lb 8 oz (65.1 kg)  10/25/15 143 lb 12.8 oz (65.2 kg)  10/19/15 144 lb 3.2 oz (65.4 kg)

## 2015-11-01 NOTE — Progress Notes (Signed)
  Radiation Oncology         [phone]   Name: Perry Robinson MRN: [id number]   Date: 11/01/2015  DOB: 08/27/1950     Weekly Radiation Therapy Management    ICD-9-CM ICD-10-CM   1. Prostate cancer ([place]) 185 C61     Current Dose: 32.4 Gy  Planned Dose:  50.4 Gy  Narrative The patient presents for routine under treatment assessment. Weight and vitals stable. Denies pain. Reports discomfort felt at hernia repair site has decreased. Denies nausea, vomiting, diarrhea, or constipation. Reports he has a good appetite. Reports he is now able to pass flatus. Reports he remains active despite fatigue. Denies skin changes within treatment field.  The patient is without complaint. Set-up films were reviewed. The chart was checked.  Physical Findings  weight is 143 lb 8 oz (65.1 kg). His blood pressure is 146/91 (abnormal) and his pulse is 89. His respiration is 16 and oxygen saturation is 100%. . Weight loss noted.  No significant changes. Alert, in no acute distress.   Impression The patient is tolerating radiation.  Plan Continue treatment as planned.         Sheral Apley Tammi Klippel, M.D.  This document serves as a record of services personally performed by Tyler Pita, MD. It was created on his behalf by Bethann Humble, a trained medical scribe. The creation of this record is based on the scribe's personal observations and the provider's statements to them. This document has been checked and approved by the attending provider.

## 2015-11-02 ENCOUNTER — Ambulatory Visit
Admission: RE | Admit: 2015-11-02 | Discharge: 2015-11-02 | Disposition: A | Discharge status: Home / Self Care | Payer: Managed Care, Other (non HMO) | Source: Ambulatory Visit | Attending: Radiation Oncology | Admitting: Radiation Oncology

## 2015-11-02 DIAGNOSIS — Z510 Encounter for antineoplastic radiation therapy: Secondary | ICD-10-CM | POA: Diagnosis not present

## 2015-11-06 ENCOUNTER — Ambulatory Visit
Admission: RE | Admit: 2015-11-06 | Discharge: 2015-11-06 | Disposition: A | Discharge status: Home / Self Care | Payer: Managed Care, Other (non HMO) | Source: Ambulatory Visit | Attending: Radiation Oncology | Admitting: Radiation Oncology

## 2015-11-06 DIAGNOSIS — Z510 Encounter for antineoplastic radiation therapy: Secondary | ICD-10-CM | POA: Diagnosis not present

## 2015-11-07 ENCOUNTER — Ambulatory Visit
Admission: RE | Admit: 2015-11-07 | Discharge: 2015-11-07 | Disposition: A | Discharge status: Home / Self Care | Payer: Managed Care, Other (non HMO) | Source: Ambulatory Visit | Attending: Radiation Oncology | Admitting: Radiation Oncology

## 2015-11-07 DIAGNOSIS — Z510 Encounter for antineoplastic radiation therapy: Secondary | ICD-10-CM | POA: Diagnosis not present

## 2015-11-08 ENCOUNTER — Other Ambulatory Visit: Payer: Self-pay | Admitting: General Surgery

## 2015-11-08 ENCOUNTER — Ambulatory Visit
Admission: RE | Admit: 2015-11-08 | Discharge: 2015-11-08 | Disposition: A | Discharge status: Home / Self Care | Payer: Managed Care, Other (non HMO) | Source: Ambulatory Visit | Attending: Radiation Oncology | Admitting: Radiation Oncology

## 2015-11-08 DIAGNOSIS — Z510 Encounter for antineoplastic radiation therapy: Secondary | ICD-10-CM | POA: Diagnosis not present

## 2015-11-08 DIAGNOSIS — C251 Malignant neoplasm of body of pancreas: Secondary | ICD-10-CM

## 2015-11-09 ENCOUNTER — Ambulatory Visit
Admission: RE | Admit: 2015-11-09 | Discharge: 2015-11-09 | Disposition: A | Discharge status: Home / Self Care | Payer: Managed Care, Other (non HMO) | Source: Ambulatory Visit | Attending: Radiation Oncology | Admitting: Radiation Oncology

## 2015-11-09 ENCOUNTER — Encounter: Payer: Self-pay | Admitting: Radiation Oncology

## 2015-11-09 VITALS — BP 111/74 | HR 78 | Temp 98.2°F | Resp 20 | Ht 69.0 in | Wt 143.0 lb

## 2015-11-09 DIAGNOSIS — C251 Malignant neoplasm of body of pancreas: Secondary | ICD-10-CM

## 2015-11-09 DIAGNOSIS — Z510 Encounter for antineoplastic radiation therapy: Secondary | ICD-10-CM | POA: Diagnosis not present

## 2015-11-09 NOTE — Progress Notes (Signed)
  Radiation Oncology         [phone]   Name: Perry Robinson MRN: [id number]   Date: 11/09/2015  DOB: 01-24-1951     Weekly Radiation Therapy Management    ICD-9-CM ICD-10-CM   1. Cancer of pancreas, body ([place]) 157.1 C25.1     Current Dose: 41.4 Gy  Planned Dose:  50.4 Gy  Narrative The patient presents for routine under treatment assessment.  Weight and vitals stable. Denies pain. Reports discomfort felt at hernia repair site has decreased. Denies nausea, vomiting, diarrhea, or constipation. Reports he has a good appetite. Reports he is now able to pass flatus. Reports he remains active despite fatigue. Denies skin changes within treatment field. Patient reports his hernia is improving.  The patient is without complaint. Set-up films were reviewed. The chart was checked.  Physical Findings  height is 5\' 9"  (1.753 m) and weight is 143 lb (64.9 kg). His oral temperature is 98.2 F (36.8 C). His blood pressure is 111/74 and his pulse is 78. His respiration is 20 and oxygen saturation is 99%. . Weight loss noted.  No significant changes. Alert, in no acute distress.   Impression The patient is tolerating radiation.  Plan Continue treatment as planned.    Sheral Apley Tammi Klippel, M.D.  This document serves as a record of services personally performed by Tyler Pita, MD. It was created on his behalf by Bethann Humble, a trained medical scribe. The creation of this record is based on the scribe's personal observations and the provider's statements to them. This document has been checked and approved by the attending provider.

## 2015-11-09 NOTE — Progress Notes (Signed)
Weight and vitals stable. Denies pain. Reports discomfort felt at hernia repair site is much less. Denies nausea, vomiting, diarrhea, or constipation. Reports he is eating "like a horse." Reports he is now able to pass flatus. Reports he remains active despite fatigue. Denies skin changes within treatment field.  Wt Readings from Last 3 Encounters:  11/09/15 143 lb (64.9 kg)  11/01/15 143 lb 8 oz (65.1 kg)  10/25/15 143 lb 12.8 oz (65.2 kg)  BP 111/74 (BP Location: Left Arm, Patient Position: Sitting, Cuff Size: Normal)   Pulse 78   Temp 98.2 F (36.8 C) (Oral)   Resp 20   Ht 5\' 9"  (1.753 m)   Wt 143 lb (64.9 kg)   SpO2 99%   BMI 21.12 kg/m

## 2015-11-12 ENCOUNTER — Ambulatory Visit
Admission: RE | Admit: 2015-11-12 | Discharge: 2015-11-12 | Disposition: A | Discharge status: Home / Self Care | Payer: Managed Care, Other (non HMO) | Source: Ambulatory Visit | Attending: Radiation Oncology | Admitting: Radiation Oncology

## 2015-11-12 DIAGNOSIS — Z510 Encounter for antineoplastic radiation therapy: Secondary | ICD-10-CM | POA: Diagnosis not present

## 2015-11-13 ENCOUNTER — Ambulatory Visit
Admission: RE | Admit: 2015-11-13 | Discharge: 2015-11-13 | Disposition: A | Discharge status: Home / Self Care | Payer: Managed Care, Other (non HMO) | Source: Ambulatory Visit | Attending: Radiation Oncology | Admitting: Radiation Oncology

## 2015-11-13 DIAGNOSIS — Z510 Encounter for antineoplastic radiation therapy: Secondary | ICD-10-CM | POA: Diagnosis not present

## 2015-11-14 ENCOUNTER — Ambulatory Visit
Admission: RE | Admit: 2015-11-14 | Discharge: 2015-11-14 | Disposition: A | Discharge status: Home / Self Care | Payer: Managed Care, Other (non HMO) | Source: Ambulatory Visit | Attending: Radiation Oncology | Admitting: Radiation Oncology

## 2015-11-14 DIAGNOSIS — Z510 Encounter for antineoplastic radiation therapy: Secondary | ICD-10-CM | POA: Diagnosis not present

## 2015-11-15 ENCOUNTER — Ambulatory Visit
Admission: RE | Admit: 2015-11-15 | Discharge: 2015-11-15 | Disposition: A | Discharge status: Home / Self Care | Payer: Managed Care, Other (non HMO) | Source: Ambulatory Visit | Attending: Radiation Oncology | Admitting: Radiation Oncology

## 2015-11-15 DIAGNOSIS — Z510 Encounter for antineoplastic radiation therapy: Secondary | ICD-10-CM | POA: Diagnosis not present

## 2015-11-16 ENCOUNTER — Ambulatory Visit
Admission: RE | Admit: 2015-11-16 | Payer: Managed Care, Other (non HMO) | Source: Ambulatory Visit | Admitting: Radiation Oncology

## 2015-11-16 ENCOUNTER — Inpatient Hospital Stay: Admission: RE | Admit: 2015-11-16 | Payer: Managed Care, Other (non HMO) | Source: Ambulatory Visit

## 2015-11-16 ENCOUNTER — Ambulatory Visit: Admission: RE | Admit: 2015-11-16 | Payer: Managed Care, Other (non HMO) | Source: Ambulatory Visit

## 2015-11-19 ENCOUNTER — Encounter (HOSPITAL_COMMUNITY)
Admission: RE | Admit: 2015-11-19 | Discharge: 2015-11-19 | Disposition: A | Discharge status: Home / Self Care | Payer: Managed Care, Other (non HMO) | Source: Ambulatory Visit | Attending: Urology | Admitting: Urology

## 2015-11-19 ENCOUNTER — Ambulatory Visit
Admission: RE | Admit: 2015-11-19 | Discharge: 2015-11-19 | Disposition: A | Discharge status: Home / Self Care | Payer: Medicare Other | Source: Ambulatory Visit | Attending: Radiation Oncology | Admitting: Radiation Oncology

## 2015-11-19 ENCOUNTER — Ambulatory Visit: Payer: Managed Care, Other (non HMO)

## 2015-11-19 ENCOUNTER — Ambulatory Visit: Admission: RE | Admit: 2015-11-19 | Payer: Managed Care, Other (non HMO) | Source: Ambulatory Visit

## 2015-11-19 ENCOUNTER — Encounter: Payer: Self-pay | Admitting: Radiation Oncology

## 2015-11-19 VITALS — BP 132/79 | HR 78 | Temp 98.6°F | Resp 20 | Wt 141.1 lb

## 2015-11-19 DIAGNOSIS — C251 Malignant neoplasm of body of pancreas: Secondary | ICD-10-CM

## 2015-11-19 DIAGNOSIS — C61 Malignant neoplasm of prostate: Secondary | ICD-10-CM | POA: Insufficient documentation

## 2015-11-19 MED ORDER — TECHNETIUM TC 99M MEDRONATE IV KIT: 25.0000 | PACK | Freq: Once | INTRAVENOUS | Status: DC | PRN

## 2015-11-19 NOTE — Progress Notes (Signed)
  Radiation Oncology         [phone]   Name: Perry Robinson MRN: [id number]   Date: 11/19/2015  DOB: 1951/01/04     Weekly Radiation Therapy Management    ICD-9-CM ICD-10-CM   1. Cancer of pancreas, body ([place]) 157.1 C25.1     Current Dose: 50.4 Gy  Planned Dose:  50.4 Gy  Narrative The patient presents for routine under treatment assessment.  The patient complains of abdominal gas and diarrhea. He had a CT abdomen done this morning 9 am at [facility] office. He has Imodium at home. He states he had 4 loose stools form the contrast today. He notes a good appetite and energy level.   Set-up films were reviewed. The chart was checked.  Physical Findings  weight is 141 lb 1.6 oz (64 kg). His oral temperature is 98.6 F (37 C). His blood pressure is 132/79 and his pulse is 78. His respiration is 20. . Weight loss noted.  No significant changes. Alert, in no acute distress.   Impression The patient tolerated radiation relatively well.  Plan Complete radiation today as scheduled, and follow-up in one month. The patient was encouraged to call or return to the clinic in the interim for any worsening symptoms.    Sheral Apley Tammi Klippel, M.D.  This document serves as a record of services personally performed by Tyler Pita, MD. It was created on his behalf by Bethann Humble, a trained medical scribe. The creation of this record is based on the scribe's personal observations and the provider's statements to them. This document has been checked and approved by the attending provider.

## 2015-11-19 NOTE — Progress Notes (Signed)
Weekly rad txs pancreas 27/28 completed, machine is down, hasn't been treated as yet, no c/o pain , does have abdiominal gas and diarrhea, had Ct abdomen done this  9 am at [facility] office , he does hhave imodium at home, has had 4 loose stools from the contrast today, appetite good, energy level "Not bad, down a little stated" 2:24 PM BP 132/79 (BP Location: Left Arm, Patient Position: Sitting, Cuff Size: Normal)   Pulse 78   Temp 98.6 F (37 C) (Oral)   Resp 20   Wt 141 lb 1.6 oz (64 kg)   BMI 20.84 kg/m   Wt Readings from Last 3 Encounters:  11/19/15 141 lb 1.6 oz (64 kg)  11/09/15 143 lb (64.9 kg)  11/01/15 143 lb 8 oz (65.1 kg)

## 2015-11-20 ENCOUNTER — Ambulatory Visit
Admission: RE | Admit: 2015-11-20 | Discharge: 2015-11-20 | Disposition: A | Discharge status: Home / Self Care | Payer: Managed Care, Other (non HMO) | Source: Ambulatory Visit | Attending: Radiation Oncology | Admitting: Radiation Oncology

## 2015-11-20 ENCOUNTER — Ambulatory Visit: Payer: Managed Care, Other (non HMO)

## 2015-11-20 ENCOUNTER — Encounter: Payer: Self-pay | Admitting: Radiation Oncology

## 2015-11-20 DIAGNOSIS — Z510 Encounter for antineoplastic radiation therapy: Secondary | ICD-10-CM | POA: Diagnosis not present

## 2015-11-21 ENCOUNTER — Ambulatory Visit: Payer: Managed Care, Other (non HMO)

## 2015-11-25 NOTE — Progress Notes (Signed)
  Radiation Oncology         [phone] ________________________________  Name: Perry Robinson MRN: [id number]  Date: 11/20/2015  DOB: [birth year]  End of Treatment Note  Diagnosis:   65 yo gentleman with stage IIB, T1, N1 adenocarcinoma of the body of the pancreas     Indication for treatment:  Curative, Adjuvant       Radiation treatment dates:   10/09/15-11/20/15  Site/dose:   50.4 Gy in 28 fractions to the peri-pancreatic area  Beams/energy:   He treated using helical intensity modulated radiotherapy delivering 6 megavolt photons. Image guidance was performed with megavoltage CT studies prior to each fraction. He was immobilized with a body fix lower extremity mold.   Narrative: The patient tolerated radiation treatment relatively well.   He had minimal nausea.  Plan: The patient has completed radiation treatment. The patient will return to radiation oncology clinic for routine followup in one month. I advised him to call or return sooner if he has any questions or concerns related to his recovery or treatment. ________________________________  Sheral Apley. Tammi Klippel, M.D.

## 2015-11-27 ENCOUNTER — Telehealth: Payer: Self-pay | Admitting: *Deleted

## 2015-11-27 NOTE — Telephone Encounter (Signed)
Left VM for patient to call navigator regarding return appointment with Dr. Benay Spice to review all that has happened and what his recommendations are for continued treatment or follow up if he declines further treatment. Left direct # to return call to navigator.

## 2015-11-30 ENCOUNTER — Ambulatory Visit
Admission: RE | Admit: 2015-11-30 | Discharge: 2015-11-30 | Disposition: A | Discharge status: Home / Self Care | Payer: Managed Care, Other (non HMO) | Source: Ambulatory Visit | Attending: Radiation Oncology | Admitting: Radiation Oncology

## 2015-11-30 ENCOUNTER — Emergency Department (HOSPITAL_COMMUNITY): Payer: Managed Care, Other (non HMO)

## 2015-11-30 ENCOUNTER — Encounter: Payer: Self-pay | Admitting: Radiation Oncology

## 2015-11-30 ENCOUNTER — Encounter (HOSPITAL_COMMUNITY): Payer: Self-pay | Admitting: Emergency Medicine

## 2015-11-30 ENCOUNTER — Emergency Department (HOSPITAL_COMMUNITY)
Admission: EM | Admit: 2015-11-30 | Discharge: 2015-11-30 | Disposition: A | Discharge status: Home / Self Care | Payer: Managed Care, Other (non HMO) | Attending: Emergency Medicine | Admitting: Emergency Medicine

## 2015-11-30 VITALS — BP 131/85 | HR 85 | Temp 97.7°F | Ht 69.0 in

## 2015-11-30 DIAGNOSIS — F17210 Nicotine dependence, cigarettes, uncomplicated: Secondary | ICD-10-CM | POA: Insufficient documentation

## 2015-11-30 DIAGNOSIS — C251 Malignant neoplasm of body of pancreas: Secondary | ICD-10-CM

## 2015-11-30 DIAGNOSIS — Z8546 Personal history of malignant neoplasm of prostate: Secondary | ICD-10-CM | POA: Insufficient documentation

## 2015-11-30 DIAGNOSIS — K5900 Constipation, unspecified: Secondary | ICD-10-CM

## 2015-11-30 DIAGNOSIS — Z79899 Other long term (current) drug therapy: Secondary | ICD-10-CM | POA: Diagnosis not present

## 2015-11-30 DIAGNOSIS — C801 Malignant (primary) neoplasm, unspecified: Secondary | ICD-10-CM

## 2015-11-30 DIAGNOSIS — I10 Essential (primary) hypertension: Secondary | ICD-10-CM | POA: Insufficient documentation

## 2015-11-30 DIAGNOSIS — Z510 Encounter for antineoplastic radiation therapy: Secondary | ICD-10-CM | POA: Diagnosis not present

## 2015-11-30 DIAGNOSIS — R1084 Generalized abdominal pain: Secondary | ICD-10-CM

## 2015-11-30 LAB — COMPREHENSIVE METABOLIC PANEL
ALK PHOS: 67 U/L (ref 38–126)
ALT: 13 U/L — AB (ref 17–63)
AST: 21 U/L (ref 15–41)
Albumin: 3.1 g/dL — ABNORMAL LOW (ref 3.5–5.0)
Anion gap: 7 (ref 5–15)
BILIRUBIN TOTAL: 0.4 mg/dL (ref 0.3–1.2)
BUN: 13 mg/dL (ref 6–20)
CALCIUM: 8.8 mg/dL — AB (ref 8.9–10.3)
CO2: 28 mmol/L (ref 22–32)
CREATININE: 0.47 mg/dL — AB (ref 0.61–1.24)
Chloride: 99 mmol/L — ABNORMAL LOW (ref 101–111)
Glucose, Bld: 131 mg/dL — ABNORMAL HIGH (ref 65–99)
Potassium: 3.6 mmol/L (ref 3.5–5.1)
Sodium: 134 mmol/L — ABNORMAL LOW (ref 135–145)
TOTAL PROTEIN: 6.2 g/dL — AB (ref 6.5–8.1)

## 2015-11-30 LAB — CBC WITH DIFFERENTIAL/PLATELET
Basophils Absolute: 0 10*3/uL (ref 0.0–0.1)
Basophils Relative: 0 %
Eosinophils Absolute: 0 10*3/uL (ref 0.0–0.7)
Eosinophils Relative: 1 %
HEMATOCRIT: 33 % — AB (ref 39.0–52.0)
HEMOGLOBIN: 11.4 g/dL — AB (ref 13.0–17.0)
LYMPHS ABS: 1 10*3/uL (ref 0.7–4.0)
LYMPHS PCT: 16 %
MCH: 31.5 pg (ref 26.0–34.0)
MCHC: 34.5 g/dL (ref 30.0–36.0)
MCV: 91.2 fL (ref 78.0–100.0)
MONOS PCT: 13 %
Monocytes Absolute: 0.8 10*3/uL (ref 0.1–1.0)
NEUTROS PCT: 70 %
Neutro Abs: 4.5 10*3/uL (ref 1.7–7.7)
Platelets: 371 10*3/uL (ref 150–400)
RBC: 3.62 MIL/uL — AB (ref 4.22–5.81)
RDW: 15.7 % — ABNORMAL HIGH (ref 11.5–15.5)
WBC: 6.3 10*3/uL (ref 4.0–10.5)

## 2015-11-30 LAB — CBG MONITORING, ED: Glucose-Capillary: 146 mg/dL — ABNORMAL HIGH (ref 65–99)

## 2015-11-30 MED ORDER — SODIUM CHLORIDE 0.9 % IV BOLUS (SEPSIS)
1000.0000 mL | Freq: Once | INTRAVENOUS | Status: AC
  Administered 2015-11-30: 1000 mL via INTRAVENOUS

## 2015-11-30 MED ORDER — FENTANYL CITRATE (PF) 100 MCG/2ML IJ SOLN
50.0000 ug | Freq: Once | INTRAMUSCULAR | Status: AC
  Administered 2015-11-30: 50 ug via INTRAVENOUS
  Filled 2015-11-30: qty 2

## 2015-11-30 MED ORDER — IOPAMIDOL (ISOVUE-300) INJECTION 61%
30.0000 mL | Freq: Once | INTRAVENOUS | Status: AC | PRN
  Administered 2015-11-30: 30 mL via ORAL

## 2015-11-30 MED ORDER — POLYETHYLENE GLYCOL 3350 17 G PO PACK: 17.0000 g | PACK | Freq: Three times a day (TID) | ORAL | 0 refills | Status: DC

## 2015-11-30 MED ORDER — IOPAMIDOL (ISOVUE-300) INJECTION 61%
100.0000 mL | Freq: Once | INTRAVENOUS | Status: AC | PRN
Start: 2015-11-30 — End: 2015-11-30
  Administered 2015-11-30: 100 mL via INTRAVENOUS

## 2015-11-30 NOTE — ED Notes (Signed)
Pt ambulatory to restroom without difficulty.

## 2015-11-30 NOTE — Progress Notes (Signed)
Radiation Oncology         [phone] ________________________________  Name: Perry Robinson MRN: [id number]  Date: 11/30/2015  DOB: December 04, 1950  Post Treatment Note  CC: Kirkland Hun, PA-C  Diagnosis:  65 yo gentleman with stage IIB, T1, N1 adenocarcinoma of the body of the pancreas     Interval Since Last Radiation: 1 week  10/09/15-11/20/15:  50.4 Gy in 28 fractions to the peri-pancreatic area  Narrative:  The patient returns today for an unscheduled visit due to abdominal pain.  Of note his course of treatment was uncomplicated, and he did very well. Prior to radiotherapy, she had undergone subtotal pancreatectomy and splenectomy on 08/27/2015. Pathology revealed invasive moderately differentiated adenocarcinoma measuring 1.8 cm. Associated high-grade pancreatic epithelial neoplasia was noted with a background chronic pancreatitis. Margins were negative, and 3 of 11 lymph nodes were positive for metastatic adenocarcinoma. He received adjuvant Xeloda during radiotherapy, but has declined systemic chemotherapy. He is followed by Dr. Benay Spice.                           On review of systems, the patient states that the last week has been quite difficult to get through. He has been very fatigued, and has noticed episodes of nausea and has had a difficult time with eating much solid food. He states that he urinates 2-3 times per day. He has had about 2-3 episodes of emesis, and reports increasing abdominal pain in the last 24 hours. He has been constipated and reports his last bowel movement was on 10/19 2017. He denies any palpitations or chest pain. He has not expensing shortness of breath. He describes hot flashes and sweats but denies having any having any documented fevers at home. A complete review of systems is obtained and is otherwise negative.  ALLERGIES:  has No Known Allergies.  Meds: Current Outpatient Prescriptions  Medication Sig Dispense Refill  .  capecitabine (XELODA) 500 MG tablet 1500 mg twice daily on days of radiation only (Patient not taking: Reported on 11/19/2015) 120 tablet 0  . lidocaine-prilocaine (EMLA) cream Apply to portacath site 1 hour prior to being accessed (Patient not taking: Reported on 11/19/2015) 30 g 2  . lisinopril (PRINIVIL,ZESTRIL) 20 MG tablet Take 1 tablet (20 mg total) by mouth daily. 90 tablet 1  . oxyCODONE-acetaminophen (ROXICET) 5-325 MG tablet Take 1-2 tablets by mouth every 4 (four) hours as needed. (Patient not taking: Reported on 11/19/2015) 30 tablet 0  . prochlorperazine (COMPAZINE) 10 MG tablet Take 1 tablet (10 mg total) by mouth every 6 (six) hours as needed for nausea or vomiting. (Patient not taking: Reported on 11/19/2015) 30 tablet 2   No current facility-administered medications for this encounter.     Physical Findings:  height is 5\' 9"  (1.753 m). His temperature is 97.7 F (36.5 C). His blood pressure is 131/85 and his pulse is 85.  In general this is an uncomfortable African American male  in no acute distress. He's alert and oriented x4 and appropriate throughout the examination. Cardiopulmonary assessment is negative for acute distress and reveals a regular rate within occasional PVC, no clicks, rubs, or murmurs are auscultated. Chest is clear to auscultation bilaterally. The abdominal exam reveals bowel sounds are hyperactive in all quadrants, there is a well healed midline laparotomy incision without any gross changes are concerns of the skin. With light palpation the patient cries out in pain. This is diffuse over  the upper quadrants bilaterally. No reproducible pain is noted in the right lower quadrant, and no rebound tenderness is present.Lower extremities are negative for pretibial pitting edema. Skin is intact but there is minimal tenting of the skin.  Lab Findings: Lab Results  Component Value Date   WBC 4.4 10/09/2015   HGB 13.8 10/09/2015   HCT 41.4 10/09/2015   MCV 93.9 10/09/2015    PLT 330 10/09/2015     Radiographic Findings: Nm Bone Scan Whole Body  Result Date: 11/22/2015 CLINICAL DATA:  WOV PROSTATE CA, RESEARCH STUDY EMBARK [id number] DR EDMUNDS TO READ 18 MCI TC-MDP RAC GHT EXAM: NUCLEAR MEDICINE WHOLE BODY BONE SCAN TECHNIQUE: Whole body anterior and posterior images were obtained approximately 3 hours after intravenous injection of radiopharmaceutical. RADIOPHARMACEUTICALS:  Eighteen mCi Technetium-46m MDP IV PCWG2 Measurements for Bone Metastasis: Baseline 12/09/2013 No evidence skeletal metastasis COMPARISON:  Bone scan 06/07/2015 FINDINGS: No focal uptake within the axillary or appendicular skeleton to suggest metastatic prostate cancer. Sacrum partially obscured by radiotracer in the bladder. Scoliosis spine. Degenerative uptake in the RIGHT MTP joint IMPRESSION: No evidence skeletal metastasis.  No change from prior. Electronically Signed   By: Suzy Bouchard M.D.   On: 11/22/2015 08:09    Impression/Plan: 1. Acute onset abdominal pain given a history of stage IIB, T1, N1 adenocarcinoma of the pancreas. I said with patient about his exam findings is concerned about. My main concern at this point would be that he may be experiencing symptoms of pancreatitis. We appreciate the evaluation in the emergency room and will continue to follow up expectantly. I have notified his wife and his that he will not be work today and regarding his current situation. I will copy Dr. Benay Spice as well as that he is aware of the patient's evaluation.     Carola Rhine, PAC

## 2015-11-30 NOTE — ED Triage Notes (Signed)
Pt complaint of worsening right abdominal pain with associated nausea, vomiting, and constipation onset Monday. Pain worse with laying down at night. Pt completed radiation for pancreatic cancer 9/19 and last BM 9/18.

## 2015-11-30 NOTE — ED Notes (Signed)
Pt request update. Cardama MD aware and verbalizes en route to pt bedside.

## 2015-11-30 NOTE — Addendum Note (Signed)
Encounter addended by: Benn Moulder, RN on: 11/30/2015  1:22 PM<BR>    Actions taken: Charge Capture section accepted

## 2015-11-30 NOTE — ED Provider Notes (Signed)
[facility] DEPT Provider Note   CSN: [id number] Arrival date & time: 11/30/15  [id number]     History   Chief Complaint Chief Complaint  Patient presents with  . Abdominal Pain  . Cancer    HPI Perry Robinson is a 65 y.o. male.  The history is provided by the patient.  Abdominal Pain   This is a recurrent problem. The pain is located in the suprapubic region. The quality of the pain is pressure-like and aching. The pain is at a severity of 8/10. The pain is moderate. Associated symptoms include nausea and constipation. Pertinent negatives include fever, diarrhea, flatus, melena, vomiting, dysuria, hematuria and arthralgias. The symptoms are aggravated by certain positions. The symptoms are relieved by being still. Past workup includes surgery.    Past Medical History:  Diagnosis Date  . Cancer of pancreas, body ([place]) 08/2015  . Cataract   . Diverticulosis   . GERD (gastroesophageal reflux disease)   . Hypertension   . Inguinal hernia   . Prostate cancer ([place]) 07/11/2004    Patient Active Problem List   Diagnosis Date Noted  . Family history of prostate cancer 09/12/2015  . Carcinoma of body of pancreas ([place]) 08/27/2015  . Cancer of pancreas, body ([place]) 06/28/2015  . History of cervical discectomy 05/29/2015  . Diverticulosis of colon 06/09/2013  . Back pain with left-sided radiculopathy 04/13/2013  . Hypertension   . Prostate cancer [facility])     Past Surgical History:  Procedure Laterality Date  . CATARACT EXTRACTION Bilateral 2011  . EUS N/A 06/28/2015   Procedure: UPPER ENDOSCOPIC ULTRASOUND (EUS) LINEAR;  Surgeon: [name], MD;  Location: [facility] ENDOSCOPY;  Service: Endoscopy;  Laterality: N/A;  spoke with patient about new check in/procedure times JB  . EYE SURGERY    . FINE NEEDLE ASPIRATION  06/28/2015   Procedure: FINE NEEDLE ASPIRATION;  Surgeon: [name], MD;  Location: [facility] ENDOSCOPY;  Service: Endoscopy;;  . INGUINAL HERNIA REPAIR Right 08/27/2015     Procedure: OPEN RIGHT INGUINAL HERNIA ;  Surgeon: Ralene Ok, MD;  Location: [facility];  Service: General;  Laterality: Right;  . INSERTION OF MESH Right 08/27/2015   Procedure: INSERTION OF MESH;  Surgeon: Ralene Ok, MD;  Location: [facility];  Service: General;  Laterality: Right;  . LAPAROSCOPY N/A 08/27/2015   Procedure: LAPAROSCOPY DIAGNOSTIC;  Surgeon: Stark Klein, MD;  Location: [facility];  Service: General;  Laterality: N/A;  . PROSTATECTOMY  08/2004   2nd to [place]  . ROTATOR CUFF REPAIR Bilateral 10/2008   11,10       Home Medications    Prior to Admission medications   Medication Sig Start Date End Date Taking? Authorizing Provider  lisinopril (PRINIVIL,ZESTRIL) 20 MG tablet Take 1 tablet (20 mg total) by mouth daily. 06/05/15  Yes Mancel Bale, PA-C  capecitabine (XELODA) 500 MG tablet 1500 mg twice daily on days of radiation only Patient not taking: Reported on 11/30/2015 10/09/15   Ladell Pier, MD  lidocaine-prilocaine (EMLA) cream Apply to portacath site 1 hour prior to being accessed Patient not taking: Reported on 11/30/2015 09/18/15   Owens Shark, NP  oxyCODONE-acetaminophen (ROXICET) 5-325 MG tablet Take 1-2 tablets by mouth every 4 (four) hours as needed. Patient not taking: Reported on 11/30/2015 08/27/15   Ralene Ok, MD  polyethylene glycol [facility]) packet Take 17 g by mouth 3 (three) times daily. 11/30/15 12/07/15  Fatima Blank, MD  prochlorperazine (COMPAZINE) 10 MG tablet Take 1  tablet (10 mg total) by mouth every 6 (six) hours as needed for nausea or vomiting. Patient not taking: Reported on 11/30/2015 09/18/15   Owens Shark, NP    Family History Family History  Problem Relation Age of Onset  . Thyroid disease Mother   . Prostate cancer Father   . Prostate cancer Brother     dx. late 33s; treated with stem cell therapy  . Prostate cancer Maternal Grandfather     d. middle ages  . Colon cancer Neg Hx     Social History Social History   Substance Use Topics  . Smoking status: Current Every Day Smoker    Packs/day: 0.25    Years: 10.00    Types: Cigarettes  . Smokeless tobacco: Never Used  . Alcohol use Yes     Comment: "three sips of beer a day"     Allergies   Review of patient's allergies indicates no known allergies.   Review of Systems Review of Systems  Constitutional: Negative for chills and fever.  HENT: Negative for ear pain and sore throat.   Eyes: Negative for pain and visual disturbance.  Respiratory: Negative for cough and shortness of breath.   Cardiovascular: Negative for chest pain and palpitations.  Gastrointestinal: Positive for abdominal pain, constipation and nausea. Negative for diarrhea, flatus, melena and vomiting.  Genitourinary: Negative for dysuria and hematuria.  Musculoskeletal: Negative for arthralgias and back pain.  Skin: Negative for color change and rash.  Neurological: Negative for seizures and syncope.  All other systems reviewed and are negative.    Physical Exam Updated Vital Signs BP 137/80   Pulse 74   Temp 98.3 F (36.8 C) (Oral)   Resp 21   Ht 5' 8.5" (1.74 m)   Wt 140 lb (63.5 kg)   SpO2 100%   BMI 20.98 kg/m   Physical Exam  Constitutional: He is oriented to person, place, and time. He appears well-developed and well-nourished. No distress.  HENT:  Head: Normocephalic and atraumatic.  Nose: Nose normal.  Eyes: Conjunctivae and EOM are normal. Pupils are equal, round, and reactive to light. Right eye exhibits no discharge. Left eye exhibits no discharge. No scleral icterus.  Neck: Normal range of motion. Neck supple.  Cardiovascular: Normal rate and regular rhythm.  Exam reveals no gallop and no friction rub.   No murmur heard. Pulmonary/Chest: Effort normal and breath sounds normal. No stridor. No respiratory distress. He has no rales.  Abdominal: Soft. He exhibits no distension. There is no tenderness (discomfort with muscle tensing; otherwise  nontender). There is no rigidity, no rebound, no guarding and no CVA tenderness.    Musculoskeletal: He exhibits no edema or tenderness.  Neurological: He is alert and oriented to person, place, and time.  Skin: Skin is warm and dry. No rash noted. He is not diaphoretic. No erythema.  Psychiatric: He has a normal mood and affect.  Vitals reviewed.    ED Treatments / Results  Labs (all labs ordered are listed, but only abnormal results are displayed) Labs Reviewed  CBC WITH DIFFERENTIAL/PLATELET - Abnormal; Notable for the following:       Result Value   RBC 3.62 (*)    Hemoglobin 11.4 (*)    HCT 33.0 (*)    RDW 15.7 (*)    All other components within normal limits  COMPREHENSIVE METABOLIC PANEL - Abnormal; Notable for the following:    Sodium 134 (*)    Chloride 99 (*)    Glucose,  Bld 131 (*)    Creatinine, Ser 0.47 (*)    Calcium 8.8 (*)    Total Protein 6.2 (*)    Albumin 3.1 (*)    ALT 13 (*)    All other components within normal limits  CBG MONITORING, ED - Abnormal; Notable for the following:    Glucose-Capillary 146 (*)    All other components within normal limits    EKG  EKG Interpretation None       Radiology Ct Abdomen Pelvis W Contrast  Result Date: 11/30/2015 CLINICAL DATA:  Worsening right abdominal pain, nausea, completed radiation for pancreatic cancer 9/19 slid/17 EXAM: CT ABDOMEN AND PELVIS WITH CONTRAST TECHNIQUE: Multidetector CT imaging of the abdomen and pelvis was performed using the standard protocol following bolus administration of intravenous contrast. CONTRAST:  126mL ISOVUE-300 IOPAMIDOL (ISOVUE-300) INJECTION 61% COMPARISON:  11/19/2015 FINDINGS: Lower chest: Lung bases are unremarkable. Small hiatal hernia is noted. Hepatobiliary: No focal hepatic mass. Mild perihepatic ascites. No calcified gallstones are noted within gallbladder. Pancreas: Again noted status post surgical resection of pancreatic body and tail. The previous pancreatic  pseudocyst is not identified. At the site of the pancreatic cyst there is a elongated collection measures 6.2 cm length by 1.6 cm thickness with mild enhancing peripheral wall. There is small air and fluid within collection. This is highly suspicious for residual abscess or pseudocyst. There is a fistulous tract and large communication with medial wall of the stomach/gastric lumen please see axial image 30 measures at least 1 cm. The collection is in close proximity just anterior to the portal vein. The portal vein is about 3 mm posterior to the collection. Also the collection is close proximity with gastro hepatic artery. No evidence of active bleed or contrast extravasation. Again noted collateral vessels in anterior mesentery anterior to the stomach. There is stranding of the fat just anterior to SMA and inferior to portal vein suspicious for inflammatory changes. Postradiation mesenteritis cannot be excluded. Spleen: The patient is status post splenectomy. Adrenals/Urinary Tract: No adrenal gland mass. Kidneys are symmetrical in size and enhancement. No hydronephrosis or hydroureter. Stomach/Bowel: There is significant thickening of distal gastric wall. Findings highly suspicious for gastritis. Postradiation gastritis cannot be excluded. No definite evidence of gastric outlet obstruction. No small bowel obstruction. Moderate stool and gas noted within stomach and cecum. The terminal ileum is unremarkable. Normal appendix partially visualized in axial image 54. Some colonic gas and stool noted in distal colon. No distal colonic obstruction. No evidence of acute colitis or diverticulitis. Moderate stool noted within transverse colon. Vascular/Lymphatic: No aortic aneurysm. Atherosclerotic calcifications of abdominal aorta and iliac arteries. The portal vein, superior mesenteric artery superior mesenteric vein appear patent. No evidence of contrast extravasation or active bleed. Bilateral renal artery is patent.  There are left mesenteric lymph nodes just lateral to SMA measuring 1.2 cm. This may be reactive in nature or due to metastatic disease. Please see axial image 35. Reproductive: The patient is status post prostatectomy. No pelvic mass is noted. Other: The urinary bladder is unremarkable. No evidence of pelvic ascites. No inguinal adenopathy. Musculoskeletal: No destructive bony lesions are noted. Sagittal images of the spine shows mild degenerative changes thoracolumbar spine. IMPRESSION: 1. Again noted status post surgical resection of pancreatic body and tail. The previous pancreatic pseudocyst is not identified. At the site of the pancreatic cyst there is a elongated collection measures 6.2 cm length by 1.6 cm thickness with mild enhancing peripheral wall. There is small air and fluid  within collection. This is highly suspicious for residual abscess or pseudocyst. There is a fistulous tract and large communication with medial wall of the stomach/gastric lumen please see axial image 30 measures at least 1 cm. The collection is in close proximity just anterior to the portal vein. The portal vein is about 3 mm posterior to the collection. Also the collection is close proximity with gastro hepatic artery. No evidence of active bleed or contrast extravasation. Again noted collateral vessels in anterior mesentery anterior to the stomach. There is stranding of the fat just anterior to SMA and inferior to portal vein suspicious for inflammatory changes. Postradiation mesenteritis cannot be excluded. 2. No small bowel obstruction. 3. There are lymph nodes adjacent to SMA suspicious for metastatic disease or reactive adenopathy. 4. There is significant thickening of distal gastric wall. Postradiation gastritis cannot be excluded. 5. No small bowel or colonic obstruction. Moderate stool noted in proximal colon. Normal appendix. No pericecal inflammation. 6. Status post prostatectomy. 7. Mild degenerative changes  thoracolumbar spine. These results were called by telephone at the time of interpretation on 11/30/2015 at 12:41 pm to Dr. Addison Lank , who verbally acknowledged these results. Electronically Signed   By: Lahoma Crocker M.D.   On: 11/30/2015 12:41    Procedures Procedures (including critical care time)  Medications Ordered in ED Medications  sodium chloride 0.9 % bolus 1,000 mL (0 mLs Intravenous Stopped 11/30/15 1034)  fentaNYL (SUBLIMAZE) injection 50 mcg (50 mcg Intravenous Given 11/30/15 0934)  iopamidol (ISOVUE-300) 61 % injection 30 mL (30 mLs Oral Contrast Given 11/30/15 0934)  iopamidol (ISOVUE-300) 61 % injection 100 mL (100 mLs Intravenous Contrast Given 11/30/15 1139)     Initial Impression / Assessment and Plan / ED Course  I have reviewed the triage vital signs and the nursing notes.  Pertinent labs & imaging results that were available during my care of the patient were reviewed by me and considered in my medical decision making (see chart for details).  Clinical Course    CT obtained given recent surgery and lack of bowel movements or gas in the past week to rule out small bowel obstruction. Radiology was concerned for possible fistulogram between the pancreas and stomach. Discussed case with [name] who reviewed the film and did not feel that this was associated with fistula. Given that the patient is well-appearing, nontoxic with reassuring labs, I also agree with her assessment. CT did reveal a however the patient is constipated.  We'll provide patient with regimen of MiraLAX and follow up with surgery in 1-2 weeks.  Final Clinical Impressions(s) / ED Diagnoses   Final diagnoses:  Constipation, unspecified constipation type  Generalized abdominal pain  Cancer ([place])   Disposition: Discharge  Condition: Good  I have discussed the results, Dx and Tx plan with the patient who expressed understanding and agree(s) with the plan. Discharge instructions discussed at great  length. The patient was given strict return precautions who verbalized understanding of the instructions. No further questions at time of discharge.    New Prescriptions   POLYETHYLENE GLYCOL (MIRALAX) PACKET    Take 17 g by mouth 3 (three) times daily.    Follow Up: Mancel Bale, PA-C [place] [id number] [phone]  Call  As needed  Stark Klein, MD [place] [address] [phone]  Schedule an appointment as soon as possible for a visit in 1 week For close follow up to assess for post op pain  Fatima Blank, MD 11/30/15 [phone]

## 2015-12-05 ENCOUNTER — Inpatient Hospital Stay (HOSPITAL_COMMUNITY)
Admission: EM | Admit: 2015-12-05 | Discharge: 2015-12-08 | DRG: 377 | Disposition: A | Discharge status: Home / Self Care | Payer: Managed Care, Other (non HMO) | Attending: Internal Medicine | Admitting: Internal Medicine

## 2015-12-05 ENCOUNTER — Emergency Department (HOSPITAL_COMMUNITY): Payer: Managed Care, Other (non HMO)

## 2015-12-05 ENCOUNTER — Encounter (HOSPITAL_COMMUNITY): Payer: Self-pay | Admitting: Emergency Medicine

## 2015-12-05 DIAGNOSIS — C251 Malignant neoplasm of body of pancreas: Secondary | ICD-10-CM | POA: Diagnosis not present

## 2015-12-05 DIAGNOSIS — R404 Transient alteration of awareness: Secondary | ICD-10-CM | POA: Diagnosis not present

## 2015-12-05 DIAGNOSIS — Z9081 Acquired absence of spleen: Secondary | ICD-10-CM

## 2015-12-05 DIAGNOSIS — Z9079 Acquired absence of other genital organ(s): Secondary | ICD-10-CM | POA: Diagnosis not present

## 2015-12-05 DIAGNOSIS — K921 Melena: Secondary | ICD-10-CM | POA: Diagnosis present

## 2015-12-05 DIAGNOSIS — Z8042 Family history of malignant neoplasm of prostate: Secondary | ICD-10-CM | POA: Diagnosis not present

## 2015-12-05 DIAGNOSIS — C61 Malignant neoplasm of prostate: Secondary | ICD-10-CM

## 2015-12-05 DIAGNOSIS — Z9841 Cataract extraction status, right eye: Secondary | ICD-10-CM

## 2015-12-05 DIAGNOSIS — K920 Hematemesis: Secondary | ICD-10-CM | POA: Diagnosis present

## 2015-12-05 DIAGNOSIS — K863 Pseudocyst of pancreas: Secondary | ICD-10-CM | POA: Diagnosis present

## 2015-12-05 DIAGNOSIS — Z66 Do not resuscitate: Secondary | ICD-10-CM | POA: Diagnosis present

## 2015-12-05 DIAGNOSIS — Z90411 Acquired partial absence of pancreas: Secondary | ICD-10-CM

## 2015-12-05 DIAGNOSIS — E43 Unspecified severe protein-calorie malnutrition: Secondary | ICD-10-CM | POA: Diagnosis not present

## 2015-12-05 DIAGNOSIS — Z8546 Personal history of malignant neoplasm of prostate: Secondary | ICD-10-CM

## 2015-12-05 DIAGNOSIS — R110 Nausea: Secondary | ICD-10-CM | POA: Diagnosis not present

## 2015-12-05 DIAGNOSIS — D62 Acute posthemorrhagic anemia: Secondary | ICD-10-CM | POA: Diagnosis present

## 2015-12-05 DIAGNOSIS — Z8349 Family history of other endocrine, nutritional and metabolic diseases: Secondary | ICD-10-CM

## 2015-12-05 DIAGNOSIS — Z79899 Other long term (current) drug therapy: Secondary | ICD-10-CM | POA: Diagnosis not present

## 2015-12-05 DIAGNOSIS — F17210 Nicotine dependence, cigarettes, uncomplicated: Secondary | ICD-10-CM | POA: Diagnosis present

## 2015-12-05 DIAGNOSIS — K219 Gastro-esophageal reflux disease without esophagitis: Secondary | ICD-10-CM | POA: Diagnosis present

## 2015-12-05 DIAGNOSIS — R531 Weakness: Secondary | ICD-10-CM | POA: Diagnosis not present

## 2015-12-05 DIAGNOSIS — K922 Gastrointestinal hemorrhage, unspecified: Secondary | ICD-10-CM | POA: Diagnosis not present

## 2015-12-05 DIAGNOSIS — I10 Essential (primary) hypertension: Secondary | ICD-10-CM | POA: Diagnosis present

## 2015-12-05 DIAGNOSIS — K5900 Constipation, unspecified: Secondary | ICD-10-CM | POA: Diagnosis present

## 2015-12-05 DIAGNOSIS — Z8507 Personal history of malignant neoplasm of pancreas: Secondary | ICD-10-CM | POA: Diagnosis not present

## 2015-12-05 DIAGNOSIS — Z923 Personal history of irradiation: Secondary | ICD-10-CM | POA: Diagnosis not present

## 2015-12-05 DIAGNOSIS — Z9842 Cataract extraction status, left eye: Secondary | ICD-10-CM

## 2015-12-05 DIAGNOSIS — Z9889 Other specified postprocedural states: Secondary | ICD-10-CM | POA: Diagnosis not present

## 2015-12-05 LAB — COMPREHENSIVE METABOLIC PANEL
ALBUMIN: 3.1 g/dL — AB (ref 3.5–5.0)
ALK PHOS: 69 U/L (ref 38–126)
ALT: 12 U/L — AB (ref 17–63)
AST: 20 U/L (ref 15–41)
Anion gap: 7 (ref 5–15)
BUN: 16 mg/dL (ref 6–20)
CALCIUM: 8.5 mg/dL — AB (ref 8.9–10.3)
CHLORIDE: 99 mmol/L — AB (ref 101–111)
CO2: 25 mmol/L (ref 22–32)
CREATININE: 0.51 mg/dL — AB (ref 0.61–1.24)
GFR calc Af Amer: >60 mL/min (ref >60)
GFR calc non Af Amer: >60 mL/min (ref >60)
GLUCOSE: 169 mg/dL — AB (ref 65–99)
Potassium: 4.1 mmol/L (ref 3.5–5.1)
SODIUM: 131 mmol/L — AB (ref 135–145)
Total Bilirubin: 0.8 mg/dL (ref 0.3–1.2)
Total Protein: 6.2 g/dL — ABNORMAL LOW (ref 6.5–8.1)

## 2015-12-05 LAB — CBC
HCT: 28.2 % — ABNORMAL LOW (ref 39.0–52.0)
HEMOGLOBIN: 9.9 g/dL — AB (ref 13.0–17.0)
MCH: 31.8 pg (ref 26.0–34.0)
MCHC: 35.1 g/dL (ref 30.0–36.0)
MCV: 90.7 fL (ref 78.0–100.0)
PLATELETS: 425 10*3/uL — AB (ref 150–400)
RBC: 3.11 MIL/uL — AB (ref 4.22–5.81)
RDW: 15.5 % (ref 11.5–15.5)
WBC: 8.3 10*3/uL (ref 4.0–10.5)

## 2015-12-05 LAB — URINALYSIS, ROUTINE W REFLEX MICROSCOPIC
Bilirubin Urine: NEGATIVE
GLUCOSE, UA: NEGATIVE mg/dL
HGB URINE DIPSTICK: NEGATIVE
Ketones, ur: NEGATIVE mg/dL
LEUKOCYTES UA: NEGATIVE
NITRITE: NEGATIVE
Protein, ur: 100 mg/dL — AB
Specific Gravity, Urine: 1.033 — ABNORMAL HIGH (ref 1.005–1.030)
pH: 7 (ref 5.0–8.0)

## 2015-12-05 LAB — URINE MICROSCOPIC-ADD ON

## 2015-12-05 LAB — HEMOGLOBIN AND HEMATOCRIT, BLOOD
HEMATOCRIT: 22.5 % — AB (ref 39.0–52.0)
Hemoglobin: 7.9 g/dL — ABNORMAL LOW (ref 13.0–17.0)

## 2015-12-05 LAB — POC OCCULT BLOOD, ED: FECAL OCCULT BLD: POSITIVE — AB

## 2015-12-05 LAB — I-STAT CG4 LACTIC ACID, ED: LACTIC ACID, VENOUS: 0.86 mmol/L (ref 0.5–1.9)

## 2015-12-05 LAB — ABO/RH: ABO/RH(D): B POS

## 2015-12-05 MED ORDER — IOPAMIDOL (ISOVUE-370) INJECTION 76%
100.0000 mL | Freq: Once | INTRAVENOUS | Status: AC | PRN
  Administered 2015-12-05: 100 mL via INTRAVENOUS

## 2015-12-05 MED ORDER — HYDROMORPHONE HCL 1 MG/ML IJ SOLN
1.0000 mg | INTRAMUSCULAR | Status: DC | PRN
  Administered 2015-12-06 – 2015-12-07 (×2): 1 mg via INTRAVENOUS
  Filled 2015-12-05 (×2): qty 1

## 2015-12-05 MED ORDER — ONDANSETRON HCL 4 MG PO TABS: 4.0000 mg | ORAL_TABLET | Freq: Four times a day (QID) | ORAL | Status: DC | PRN

## 2015-12-05 MED ORDER — SODIUM CHLORIDE 0.9 % IV BOLUS (SEPSIS)
1000.0000 mL | Freq: Once | INTRAVENOUS | Status: AC
Start: 2015-12-05 — End: 2015-12-05
  Administered 2015-12-05: 1000 mL via INTRAVENOUS

## 2015-12-05 MED ORDER — ONDANSETRON HCL 4 MG/2ML IJ SOLN: 4.0000 mg | Freq: Four times a day (QID) | INTRAMUSCULAR | Status: DC | PRN

## 2015-12-05 MED ORDER — SODIUM CHLORIDE 0.9 % IV SOLN
INTRAVENOUS | Status: DC
  Administered 2015-12-05 – 2015-12-06 (×3): via INTRAVENOUS

## 2015-12-05 NOTE — ED Notes (Signed)
Patient transported to X-ray 

## 2015-12-05 NOTE — Consult Note (Signed)
Reason for Consult:GI bleeding Referring Physician: Dr Hinda Robinson is an 65 y.o. male.  HPI: 65 y.o. M s/p distal pancreatectomy in June, who developed an anterior pancreatic fluid collection.  This caused difficulty with eating and early satiety.  This has been improving.  He then woke up covered in blood, which he thinks was due to emesis.  He was brought to the ED for evaluation.    Past Medical History:  Diagnosis Date  . Cancer of pancreas, body ([place]) 08/2015  . Cataract   . Diverticulosis   . GERD (gastroesophageal reflux disease)   . Hypertension   . Inguinal hernia   . Prostate cancer ([place]) 07/11/2004    Past Surgical History:  Procedure Laterality Date  . CATARACT EXTRACTION Bilateral 2011  . EUS N/A 06/28/2015   Procedure: UPPER ENDOSCOPIC ULTRASOUND (EUS) LINEAR;  Surgeon: [name], MD;  Location: [facility];  Service: Endoscopy;  Laterality: N/A;  spoke with patient about new check in/procedure times JB  . EYE SURGERY    . FINE NEEDLE ASPIRATION  06/28/2015   Procedure: FINE NEEDLE ASPIRATION;  Surgeon: [name], MD;  Location: [facility];  Service: Endoscopy;;  . INGUINAL HERNIA REPAIR Right 08/27/2015   Procedure: OPEN RIGHT INGUINAL HERNIA ;  Surgeon: Ralene Ok, MD;  Location: [place];  Service: General;  Laterality: Right;  . INSERTION OF MESH Right 08/27/2015   Procedure: INSERTION OF MESH;  Surgeon: Ralene Ok, MD;  Location: [place];  Service: General;  Laterality: Right;  . LAPAROSCOPY N/A 08/27/2015   Procedure: LAPAROSCOPY DIAGNOSTIC;  Surgeon: Stark Klein, MD;  Location: [place];  Service: General;  Laterality: N/A;  . PROSTATECTOMY  08/2004   2nd to CA  . ROTATOR CUFF REPAIR Bilateral 10/2008   11,10    Family History  Problem Relation Age of Onset  . Thyroid disease Mother   . Prostate cancer Father   . Prostate cancer Brother     dx. late 66s; treated with stem cell therapy  . Prostate cancer Maternal Grandfather    d. middle ages  . Colon cancer Neg Hx     Social History:  reports that he has been smoking Cigarettes.  He has a 2.50 pack-year smoking history. He has never used smokeless tobacco. He reports that he drinks alcohol. He reports that he does not use drugs.  Allergies: No Known Allergies  Medications: I have reviewed the patient's current medications.  Results for orders placed or performed during the hospital encounter of 12/05/15 (from the past 48 hour(s))  ABO/Rh     Status: None   Collection Time: 12/05/15  6:51 AM  Result Value Ref Range   ABO/RH(D) B POS   Comprehensive metabolic panel     Status: Abnormal   Collection Time: 12/05/15  6:55 AM  Result Value Ref Range   Sodium 131 (L) 135 - 145 mmol/L   Potassium 4.1 3.5 - 5.1 mmol/L   Chloride 99 (L) 101 - 111 mmol/L   CO2 25 22 - 32 mmol/L   Glucose, Bld 169 (H) 65 - 99 mg/dL   BUN 16 6 - 20 mg/dL   Creatinine, Ser 0.51 (L) 0.61 - 1.24 mg/dL   Calcium 8.5 (L) 8.9 - 10.3 mg/dL   Total Protein 6.2 (L) 6.5 - 8.1 g/dL   Albumin 3.1 (L) 3.5 - 5.0 g/dL   AST 20 15 - 41 U/L   ALT 12 (L) 17 - 63 U/L  Alkaline Phosphatase 69 38 - 126 U/L   Total Bilirubin 0.8 0.3 - 1.2 mg/dL   GFR calc non Af Amer >60 >60 mL/min   GFR calc Af Amer >60 >60 mL/min    Comment: (NOTE) The eGFR has been calculated using the CKD EPI equation. This calculation has not been validated in all clinical situations. eGFR's persistently <60 mL/min signify possible Chronic Kidney Disease.    Anion gap 7 5 - 15  CBC     Status: Abnormal   Collection Time: 12/05/15  6:55 AM  Result Value Ref Range   WBC 8.3 4.0 - 10.5 K/uL   RBC 3.11 (L) 4.22 - 5.81 MIL/uL   Hemoglobin 9.9 (L) 13.0 - 17.0 g/dL   HCT 28.2 (L) 39.0 - 52.0 %   MCV 90.7 78.0 - 100.0 fL   MCH 31.8 26.0 - 34.0 pg   MCHC 35.1 30.0 - 36.0 g/dL   RDW 15.5 11.5 - 15.5 %   Platelets 425 (H) 150 - 400 K/uL  Type and screen [place]     Status: None   Collection Time:  12/05/15  6:55 AM  Result Value Ref Range   ABO/RH(D) B POS    Antibody Screen NEG    Sample Expiration 12/08/2015   I-Stat CG4 Lactic Acid, ED     Status: None   Collection Time: 12/05/15  7:35 AM  Result Value Ref Range   Lactic Acid, Venous 0.86 0.5 - 1.9 mmol/L  POC occult blood, ED     Status: Abnormal   Collection Time: 12/05/15  7:36 AM  Result Value Ref Range   Fecal Occult Bld POSITIVE (A) NEGATIVE  Urinalysis, Routine w reflex microscopic     Status: Abnormal   Collection Time: 12/05/15  9:39 AM  Result Value Ref Range   Color, Urine YELLOW YELLOW   APPearance CLOUDY (A) CLEAR   Specific Gravity, Urine 1.033 (H) 1.005 - 1.030   pH 7.0 5.0 - 8.0   Glucose, UA NEGATIVE NEGATIVE mg/dL   Hgb urine dipstick NEGATIVE NEGATIVE   Bilirubin Urine NEGATIVE NEGATIVE   Ketones, ur NEGATIVE NEGATIVE mg/dL   Protein, ur 100 (A) NEGATIVE mg/dL   Nitrite NEGATIVE NEGATIVE   Leukocytes, UA NEGATIVE NEGATIVE  Urine microscopic-add on     Status: Abnormal   Collection Time: 12/05/15  9:39 AM  Result Value Ref Range   Squamous Epithelial / LPF 0-5 (A) NONE SEEN   WBC, UA 6-30 0 - 5 WBC/hpf   RBC / HPF 0-5 0 - 5 RBC/hpf   Bacteria, UA MANY (A) NONE SEEN   Casts HYALINE CASTS (A) NEGATIVE   Urine-Other MUCOUS PRESENT     Dg Chest 2 View  Result Date: 12/05/2015 CLINICAL DATA:  65 year old male with a history of hematemesis vs hemoptysis, h/o cancer, constipation EXAM: CHEST  2 VIEW COMPARISON:  CT 11/30/2015, 11/19/2015 FINDINGS: Cardiomediastinal silhouette within normal limits. No evidence of central vascular congestion. Lung volumes adequate, with coarsening of interstitial markings. No confluent airspace disease pneumothorax. No pleural effusion. Partially imaged surgical changes of the cervical region. No displaced fracture. IMPRESSION: Chronic lung changes without evidence of superimposed acute cardiopulmonary disease. Signed, Dulcy Fanny. Earleen Newport, DO [facility] Radiology Electronically Signed   By: Corrie Mckusick D.O.   On: 12/05/2015 08:21   Dg Abd 1 View  Result Date: 12/05/2015 CLINICAL DATA:  65 year old male with a history of hemoptysis versus hematemesis EXAM: ABDOMEN - 1  VIEW COMPARISON:  CT 11/30/2015, 11/19/2015 FINDINGS: Gas within stomach, small bowel, colon.  No abnormal distention. No identified free air on this plain film series. Surgical changes of the upper abdomen, compatible with the patient's given imaging history. No displaced fracture. Degenerative changes of the spine. Pelvic calcifications. IMPRESSION: Nonobstructive bowel gas pattern. No free air identified on this plain film series. Surgical changes of the upper abdomen, compatible with the patient's given imaging history. Signed, Dulcy Fanny. Earleen Newport, DO [facility] Radiology Electronically Signed   By: Corrie Mckusick D.O.   On: 12/05/2015 08:28   Ct Angio Abd/pel W And/or Wo Contrast  Result Date: 12/05/2015 CLINICAL DATA:  65 year old male with a history of new hematemesis. Prior pancreatic pseudocyst with spontaneous decompression. Patient has a history of pancreatic cancer, prior laparoscopy, open distal subtotal pancreatectomy and splenectomy 08/27/2015. EXAM: CT ABDOMEN AND PELVIS WITH CONTRAST TECHNIQUE: Multidetector CT imaging of the abdomen and pelvis was performed using the standard protocol following bolus administration of intravenous contrast. CONTRAST:  100 cc IV Isovue 370 COMPARISON:  None. FINDINGS: Lower chest: Unremarkable. Hepatobiliary: No focal liver abnormality is seen. No gallstones, gallbladder wall thickening, or biliary dilatation. Pancreas: Patient is status post distal pancreatectomy for treatment of known pancreatic carcinoma. Inflammatory changes replace the usual fat and mesenteric adjacent to the operative bed. The previous identified fistulous connection persists, with intermediate  density material extending through wall of posterior stomach defect. There is gas tracking behind posterior wall of the stomach. Head of the pancreas enhances within normal limits. Spleen: Splenectomy Adrenals/Urinary Tract: Bilateral adrenal glands unremarkable. Right kidney unremarkable.  No hydronephrosis or nephrolithiasis. Left kidney unremarkable without hydronephrosis or nephrolithiasis. Course the bilateral ureters unremarkable. Unremarkable appearance of the urinary bladder. Stomach/Bowel: Persisting inflammatory changes posterior to the stomach, with defect in the posterior stomach wall measuring 3.3 cm. Soft tissue/ fluid/succus fills the stomach wall defect, and tracks into the retroperitoneal space Similar appearance of decompressed pseudocyst in the surgical bed. Inflammatory changes extend along the distal stomach and the stomach antrum to the pylorus. No distention of small bowel or colon. No transition point. Normal appendix. Vascular/Lymphatic: Surgical changes of splenic artery ligation. Anomalous origin of left gastric artery, at the base of the celiac artery origin or potentially directly from the aorta. Small hyperenhancing/ hyperdense focus measuring 1.5 cm adjacent to the left gastric artery (image 36 of series 4). This is un chain on arterial and venous phase. Surgical clips adjacent to the wall of the decompressed pseudo sister unchanged. Edema throughout the upper abdomen mesenteric. Multiple lymph nodes of small bowel mesenteric. Scattered calcifications abdominal aorta. No dissection or aneurysm. Bilateral iliac system patent. Significant atherosclerotic changes of the proximal left SFA, incompletely imaged. Reproductive: Surgical changes of prostatectomy. Other: Surgical changes of inguinal hernia repair. Musculoskeletal: No displaced fracture. Degenerative changes of the spine. IMPRESSION: No CT angiogram evidence of active extravasation. No pseudoaneurysm identified. Re- demonstration  of pancreatic pseudocyst decompression through a large fistulous connection to the posterior stomach wall. Significant inflammatory changes are present surrounding the pseudo cyst, posterior stomach wall, and the surgical bed of the prior pancreatectomy. Phlegmonous material fills the residual pseudocyst, extending through the posterior stomach wall, potentially necrotic debris, enteric contents, or potentially blood products. Surgical changes of prior distal pancreatectomy and splenectomy. Reactive changes throughout the abdomen of multiple lymph nodes and edema/inflammation of the fat and mesenteric. Aortic atherosclerosis. These results were called by telephone at the time of interpretation on 12/05/2015 at 9:49  am to Perry. Theotis Burrow , who verbally acknowledged these results. Signed, Dulcy Fanny. Earleen Newport, DO [facility] Electronically Signed   By: Corrie Mckusick D.O.   On: 12/05/2015 09:52    Review of Systems  Constitutional: Negative for chills and fever.  Eyes: Negative for blurred vision.  Respiratory: Negative for cough and shortness of breath.   Cardiovascular: Negative for chest pain.  Gastrointestinal: Positive for abdominal pain, diarrhea, melena, nausea and vomiting.  Genitourinary: Negative for dysuria, frequency and urgency.  Neurological: Negative for dizziness and headaches.   Blood pressure 128/65, pulse 79, temperature 97.8 F (36.6 C), temperature source Oral, resp. rate 18, SpO2 100 %. Physical Exam  Constitutional: He is oriented to person, place, and time. He appears well-developed and well-nourished.  HENT:  Head: Normocephalic and atraumatic.  Eyes: Conjunctivae and EOM are normal. Pupils are equal, round, and reactive to light.  Neck: Normal range of motion. Neck supple.  Cardiovascular: Normal rate and regular rhythm.   Respiratory: Effort normal and breath sounds normal.  GI: Soft. He exhibits no distension. There  is tenderness. There is no rebound and no guarding.  Musculoskeletal: Normal range of motion.  Neurological: He is alert and oriented to person, place, and time.  Skin: Skin is warm and dry.    Assessment/Plan: 65 y.o. M with what appears to be a pseudocyst ruptured into the stomach.  This appears more decompressed that the rupture that was noted on 9/27.  Angio neg for contrast extravasation, so bleeding most likely from the stomach wall.  Currently hemodynamically stable.  Recommend watching the pt and keeping him NPO.  Follow Hgb closely.  If Hgb stabilizes, will restart liquid diet.  If not, recommend IR for embolization eval.  Would be a poor surgical candidate if bleeding continued.  Would avoid EGD if at all possible.  This was discussed with the patient's surgeon, Perry [name], who is in agreement.    Firmin Belisle C. 84/09/2070, 1:82 PM

## 2015-12-05 NOTE — Progress Notes (Signed)
Patient ID: Perry Robinson, male   DOB: 23-Aug-1950, 65 y.o.   MRN: [id number]   Called by ER MD to evaluate pt with acute GI bleed.  Reviewed Ct angio from today and CT from 9/29 - appears to have a pancreatic abscess or pseudocyst that has decompressed into stomach with fistulous connection , and gas tracking behind wall of stomach . Portal vein 71mm posterior to collection and also in close proximity to gastro hepatic artery   Discussed with Surgical PA on call -   Gi will not see  At this time

## 2015-12-05 NOTE — ED Notes (Signed)
Pt transported to CT ?

## 2015-12-05 NOTE — ED Notes (Signed)
MD at bedside. 

## 2015-12-05 NOTE — ED Triage Notes (Signed)
Pt had surgery last month to remove pancreatic cancer; past week pt c/o constipation and abdominal pain; 3 BM's this past week; pt threw up bright red blood this AM; this was the first instance of recent emesis; reports black stools as well

## 2015-12-05 NOTE — ED Notes (Signed)
Surgery at bedside.

## 2015-12-05 NOTE — ED Notes (Signed)
Hospitalist at bedside 

## 2015-12-05 NOTE — ED Notes (Signed)
Pt ambulated to bathroom 

## 2015-12-05 NOTE — H&P (Signed)
TRH H&P    Patient Demographics:    Perry Robinson, is a 65 y.o. male  MRN: [id number]  DOB - November 23, 1950  Admit Date - 12/05/2015  Referring MD/NP/PA: Dr. Rex Kras  Outpatient Primary MD for the patient is [facility], PA-C  Patient coming from: Home  Chief Complaint  Patient presents with  . Abdominal Pain      HPI:    Perry Robinson  is a 65 y.o. male, with history of adenocarcinoma of pancreas, status post open distal subtotal pancreatectomy and splenectomy 08/27/2015, status post initiation of adjuvant Xeloda and radiation on 10/09/2015. Patient has a remote history of prostate cancer, status post prostatectomy.  The patient came to the hospital after he had one episode of hematemesis this morning. Patient says that he woke up with blood on his clothes, felt dizzy after he got up from bed. When he walked to the bathroom he was weak and fell back wall and slid down. He denies passing out. No chest shortness of breath. Also has noticed black colored stool. Denies frank blood in the stool.  In the ED CT angiogram of abdomen and pelvis showed pancreatic pseudocyst decompression through a large fistulous connection to the posterior stomach wall. Both GI and surgery was consulted by the ED physician.  He denies diarrhea, no dysuria or fever. No shortness of breath. Complains of constant abdominal pain   Review of systems:    .  A full 10 point Review of Systems was done, except as stated above, all other Review of Systems were negative.   With Past History of the following :    Past Medical History:  Diagnosis Date  . Cancer of pancreas, body ([place]) 08/2015  . Cataract   . Diverticulosis   . GERD (gastroesophageal reflux disease)   . Hypertension   . Inguinal hernia   . Prostate cancer ([place]) 07/11/2004      Past Surgical History:  Procedure Laterality Date  . CATARACT EXTRACTION Bilateral  2011  . EUS N/A 06/28/2015   Procedure: UPPER ENDOSCOPIC ULTRASOUND (EUS) LINEAR;  Surgeon: [name], MD;  Location: [facility];  Service: Endoscopy;  Laterality: N/A;  spoke with patient about new check in/procedure times JB  . EYE SURGERY    . FINE NEEDLE ASPIRATION  06/28/2015   Procedure: FINE NEEDLE ASPIRATION;  Surgeon: [name], MD;  Location: [facility];  Service: Endoscopy;;  . INGUINAL HERNIA REPAIR Right 08/27/2015   Procedure: OPEN RIGHT INGUINAL HERNIA ;  Surgeon: Ralene Ok, MD;  Location: [place];  Service: General;  Laterality: Right;  . INSERTION OF MESH Right 08/27/2015   Procedure: INSERTION OF MESH;  Surgeon: Ralene Ok, MD;  Location: [place];  Service: General;  Laterality: Right;  . LAPAROSCOPY N/A 08/27/2015   Procedure: LAPAROSCOPY DIAGNOSTIC;  Surgeon: Stark Klein, MD;  Location: [place];  Service: General;  Laterality: N/A;  . PROSTATECTOMY  08/2004   2nd to [place]  . ROTATOR CUFF REPAIR Bilateral 10/2008   11,10      Social History:  Social History  Substance Use Topics  . Smoking status: Current Some Day Smoker    Packs/day: 0.25    Years: 10.00    Types: Cigarettes  . Smokeless tobacco: Never Used  . Alcohol use Yes     Comment: "three sips of beer a day"       Family History :     Family History  Problem Relation Age of Onset  . Thyroid disease Mother   . Prostate cancer Father   . Prostate cancer Brother     dx. late 84s; treated with stem cell therapy  . Prostate cancer Maternal Grandfather     d. middle ages  . Colon cancer Neg Hx       Home Medications:   Prior to Admission medications   Medication Sig Start Date End Date Taking? Authorizing Provider  lisinopril (PRINIVIL,ZESTRIL) 20 MG tablet Take 1 tablet (20 mg total) by mouth daily. 06/05/15  Yes Mancel Bale, PA-C  polyethylene glycol [facility]) packet Take 17 g by mouth 3 (three) times daily. 11/30/15 12/07/15 Yes Fatima Blank, MD  capecitabine  (XELODA) 500 MG tablet 1500 mg twice daily on days of radiation only Patient not taking: Reported on 11/30/2015 10/09/15   Ladell Pier, MD  lidocaine-prilocaine (EMLA) cream Apply to portacath site 1 hour prior to being accessed Patient not taking: Reported on 11/30/2015 09/18/15   Owens Shark, NP  oxyCODONE-acetaminophen (ROXICET) 5-325 MG tablet Take 1-2 tablets by mouth every 4 (four) hours as needed. Patient not taking: Reported on 11/30/2015 08/27/15   Ralene Ok, MD  prochlorperazine (COMPAZINE) 10 MG tablet Take 1 tablet (10 mg total) by mouth every 6 (six) hours as needed for nausea or vomiting. Patient not taking: Reported on 11/30/2015 09/18/15   Owens Shark, NP     Allergies:    No Known Allergies   Physical Exam:   Vitals  Blood pressure 117/67, pulse 79, temperature 97.4 F (36.3 C), temperature source Oral, resp. rate 20, SpO2 100 %.  1.  General: Appears in no acute distress  2. Psychiatric:  Intact judgement and  insight, awake alert, oriented x 3.  3. Neurologic: No focal neurological deficits, all cranial nerves intact.Strength 5/5 all 4 extremities, sensation intact all 4 extremities, plantars down going.  4. Eyes :  anicteric sclerae, moist conjunctivae with no lid lag. PERRLA.  5. ENMT:  Oropharynx clear with moist mucous membranes and good dentition  6. Neck:  supple, no cervical lymphadenopathy appriciated, No thyromegaly  7. Respiratory : Normal respiratory effort, good air movement bilaterally,clear to  auscultation bilaterally  8. Cardiovascular : RRR, no gallops, rubs or murmurs, no leg edema  9. Gastrointestinal:  Positive bowel sounds, abdomen soft, generalized tenderness to palpation,no hepatosplenomegaly, no rigidity or guarding       10. Skin:  No cyanosis, normal texture and turgor, no rash, lesions or ulcers  11.Musculoskeletal:  Good muscle tone,  joints appear normal , no effusions,  normal range of motion    Data Review:      CBC  Recent Labs Lab 11/30/15 0934 12/05/15 0655  WBC 6.3 8.3  HGB 11.4* 9.9*  HCT 33.0* 28.2*  PLT 371 425*  MCV 91.2 90.7  MCH 31.5 31.8  MCHC 34.5 35.1  RDW 15.7* 15.5  LYMPHSABS 1.0  --   MONOABS 0.8  --   EOSABS 0.0  --   BASOSABS 0.0  --    ------------------------------------------------------------------------------------------------------------------  Chemistries   Recent Labs  Lab 11/30/15 0934 12/05/15 0655  NA 134* 131*  K 3.6 4.1  CL 99* 99*  CO2 28 25  GLUCOSE 131* 169*  BUN 13 16  CREATININE 0.47* 0.51*  CALCIUM 8.8* 8.5*  AST 21 20  ALT 13* 12*  ALKPHOS 67 69  BILITOT 0.4 0.8   ------------------------------------------------------------------------------------------------------------------  ------------------------------------------------------------------------------------------------------------------ GFR: Estimated Creatinine Clearance: 80.5 mL/min (by C-G formula based on SCr of 0.51 mg/dL (L)). Liver Function Tests:  Recent Labs Lab 11/30/15 0934 12/05/15 0655  AST 21 20  ALT 13* 12*  ALKPHOS 67 69  BILITOT 0.4 0.8  PROT 6.2* 6.2*  ALBUMIN 3.1* 3.1*   CBG:  Recent Labs Lab 11/30/15 0842  GLUCAP 146*    --------------------------------------------------------------------------------------------------------------- Urine analysis:    Component Value Date/Time   COLORURINE YELLOW 12/05/2015 0939   APPEARANCEUR CLOUDY (A) 12/05/2015 0939   LABSPEC 1.033 (H) 12/05/2015 0939   PHURINE 7.0 12/05/2015 0939   GLUCOSEU NEGATIVE 12/05/2015 0939   HGBUR NEGATIVE 12/05/2015 0939   BILIRUBINUR NEGATIVE 12/05/2015 0939   BILIRUBINUR neg 03/23/2013 1524   KETONESUR NEGATIVE 12/05/2015 0939   PROTEINUR 100 (A) 12/05/2015 0939   UROBILINOGEN 1.0 03/23/2013 1524   UROBILINOGEN 1.0 11/03/2008 1140   NITRITE NEGATIVE 12/05/2015 0939   LEUKOCYTESUR NEGATIVE 12/05/2015 0939      Imaging Results:    Dg Chest 2 View  Result  Date: 12/05/2015 CLINICAL DATA:  65 year old male with a history of hematemesis vs hemoptysis, h/o cancer, constipation EXAM: CHEST  2 VIEW COMPARISON:  CT 11/30/2015, 11/19/2015 FINDINGS: Cardiomediastinal silhouette within normal limits. No evidence of central vascular congestion. Lung volumes adequate, with coarsening of interstitial markings. No confluent airspace disease pneumothorax. No pleural effusion. Partially imaged surgical changes of the cervical region. No displaced fracture. IMPRESSION: Chronic lung changes without evidence of superimposed acute cardiopulmonary disease. Signed, Dulcy Fanny. Earleen Newport, DO [facility] Electronically Signed   By: Corrie Mckusick D.O.   On: 12/05/2015 08:21   Dg Abd 1 View  Result Date: 12/05/2015 CLINICAL DATA:  65 year old male with a history of hemoptysis versus hematemesis EXAM: ABDOMEN - 1 VIEW COMPARISON:  CT 11/30/2015, 11/19/2015 FINDINGS: Gas within stomach, small bowel, colon.  No abnormal distention. No identified free air on this plain film series. Surgical changes of the upper abdomen, compatible with the patient's given imaging history. No displaced fracture. Degenerative changes of the spine. Pelvic calcifications. IMPRESSION: Nonobstructive bowel gas pattern. No free air identified on this plain film series. Surgical changes of the upper abdomen, compatible with the patient's given imaging history. Signed, Dulcy Fanny. Earleen Newport, DO [facility] Electronically Signed   By: Corrie Mckusick D.O.   On: 12/05/2015 08:28   Ct Angio Abd/pel W And/or Wo Contrast  Result Date: 12/05/2015 CLINICAL DATA:  65 year old male with a history of new hematemesis. Prior pancreatic pseudocyst with spontaneous decompression. Patient has a history of pancreatic cancer, prior laparoscopy, open distal subtotal pancreatectomy and splenectomy 08/27/2015. EXAM: CT ABDOMEN AND  PELVIS WITH CONTRAST TECHNIQUE: Multidetector CT imaging of the abdomen and pelvis was performed using the standard protocol following bolus administration of intravenous contrast. CONTRAST:  100 cc IV Isovue 370 COMPARISON:  None. FINDINGS: Lower chest: Unremarkable. Hepatobiliary: No focal liver abnormality is seen. No gallstones, gallbladder wall thickening, or biliary dilatation. Pancreas: Patient is status post distal pancreatectomy for treatment of known pancreatic carcinoma. Inflammatory changes replace the usual fat and mesenteric adjacent to the operative bed. The previous  identified fistulous connection persists, with intermediate density material extending through wall of posterior stomach defect. There is gas tracking behind posterior wall of the stomach. Head of the pancreas enhances within normal limits. Spleen: Splenectomy Adrenals/Urinary Tract: Bilateral adrenal glands unremarkable. Right kidney unremarkable.  No hydronephrosis or nephrolithiasis. Left kidney unremarkable without hydronephrosis or nephrolithiasis. Course the bilateral ureters unremarkable. Unremarkable appearance of the urinary bladder. Stomach/Bowel: Persisting inflammatory changes posterior to the stomach, with defect in the posterior stomach wall measuring 3.3 cm. Soft tissue/ fluid/succus fills the stomach wall defect, and tracks into the retroperitoneal space Similar appearance of decompressed pseudocyst in the surgical bed. Inflammatory changes extend along the distal stomach and the stomach antrum to the pylorus. No distention of small bowel or colon. No transition point. Normal appendix. Vascular/Lymphatic: Surgical changes of splenic artery ligation. Anomalous origin of left gastric artery, at the base of the celiac artery origin or potentially directly from the aorta. Small hyperenhancing/ hyperdense focus measuring 1.5 cm adjacent to the left gastric artery (image 36 of series 4). This is un chain on arterial and venous  phase. Surgical clips adjacent to the wall of the decompressed pseudo sister unchanged. Edema throughout the upper abdomen mesenteric. Multiple lymph nodes of small bowel mesenteric. Scattered calcifications abdominal aorta. No dissection or aneurysm. Bilateral iliac system patent. Significant atherosclerotic changes of the proximal left SFA, incompletely imaged. Reproductive: Surgical changes of prostatectomy. Other: Surgical changes of inguinal hernia repair. Musculoskeletal: No displaced fracture. Degenerative changes of the spine. IMPRESSION: No CT angiogram evidence of active extravasation. No pseudoaneurysm identified. Re- demonstration of pancreatic pseudocyst decompression through a large fistulous connection to the posterior stomach wall. Significant inflammatory changes are present surrounding the pseudo cyst, posterior stomach wall, and the surgical bed of the prior pancreatectomy. Phlegmonous material fills the residual pseudocyst, extending through the posterior stomach wall, potentially necrotic debris, enteric contents, or potentially blood products. Surgical changes of prior distal pancreatectomy and splenectomy. Reactive changes throughout the abdomen of multiple lymph nodes and edema/inflammation of the fat and mesenteric. Aortic atherosclerosis. These results were called by telephone at the time of interpretation on 12/05/2015 at 9:49 am to Dr. Theotis Burrow , who verbally acknowledged these results. Signed, Dulcy Fanny. Earleen Newport, DO [facility] Electronically Signed   By: Corrie Mckusick D.O.   On: 12/05/2015 09:52       Assessment & Plan:    Active Problems:   Hypertension   Prostate cancer ([place])   Carcinoma of body of pancreas ([place])   Family history of prostate cancer   Hematemesis   1. Hematemesis- likely from the pseudocyst gastric wall fistula, general surgery has been consulted. GI to see the patient. We'll keep the patient  nothing by mouth, normal episodes of hematemesis in the hospital. Will check H&H every 8 hours. 2. Anemia- patient's hemoglobin has dropped to 9.9, previous hemoglobin 11.4 as of 9/29. Follow H&H 3. History of pancreatic cancer- patient has history of adenocarcinoma pancreas, status post distal subtotal pancreatectomy and splenectomy 08/27/2015. Patient followed by oncology as outpatient 4. History of hypertension- hold lisinopril, as BP is soft.   DVT Prophylaxis-   SCDs   AM Labs Ordered, also please review Full Orders  Family Communication: Admission, patients condition and plan of care including tests being ordered have been discussed with the patient , his wife and sister at bedside who indicate understanding and agree with the plan and Code Status.  Code Status:  DO NOT RESUSCITATE  Admission  status: Inpatient    Time spent in minutes : 60 minutes   Marvis Bakken S M.D on 12/05/2015 at 3:56 PM  Between 7am to 7pm - Pager - [phone]. After 7pm go to [email or web address] - password [facility]  [facility] - Office  [phone]

## 2015-12-05 NOTE — ED Provider Notes (Signed)
[facility] DEPT Provider Note   CSN: [id number] Arrival date & time: 12/05/15  0607     History   Chief Complaint Chief Complaint  Patient presents with  . Abdominal Pain    HPI Perry Robinson is a 65 y.o. male.  65 year old male with past medical history including pancreatic cancer, prostate cancer, hypertension who presents with constipation, dark stools, and possible vomiting blood. Patient has had issues with constipation for the last 1 week. He presented here on 9/29 for abdominal pain, and CT at that time showed constipation. He was started on MiraLAX and reports that for the first 3 days he had multiple bowel movements and his constipation was relieved. However, his last bowel movement was 3 days ago and since then he has been unable to have a bowel movement despite continuing the MiraLAX. He notes that ever since 9/18 when he had oral contrast for a CT scan, he has had dark stools. He denies taking any medication such as Pepto-Bismol. He has continued to have abdominal pain since 9/29 and the pain kept him up all night. He dosed off this morning and woke up thinking that he had wet bed. He got up and walked to the bathroom and wife states that he fell back against the wall and slid down. She noted that he had blood all over the back of his shirt and he cannot recall what happened, whether he vomited or coughed up blood. He has had no episodes since then. Wife notes that the bed was also covered in blood so the episode happened in bed. He did not lose consciousness. No head injury. He continues to have abdominal pain but denies any nausea, shortness of breath, chest pain, or fevers. No urinary symptoms. He does note that he has had a scratchy throat and runny nose since yesterday.   The history is provided by the patient.  Abdominal Pain      Past Medical History:  Diagnosis Date  . Cancer of pancreas, body ([place]) 08/2015  . Cataract   . Diverticulosis   . GERD  (gastroesophageal reflux disease)   . Hypertension   . Inguinal hernia   . Prostate cancer ([place]) 07/11/2004    Patient Active Problem List   Diagnosis Date Noted  . Family history of prostate cancer 09/12/2015  . Carcinoma of body of pancreas ([place]) 08/27/2015  . Cancer of pancreas, body ([place]) 06/28/2015  . History of cervical discectomy 05/29/2015  . Diverticulosis of colon 06/09/2013  . Back pain with left-sided radiculopathy 04/13/2013  . Hypertension   . Prostate cancer [facility])     Past Surgical History:  Procedure Laterality Date  . CATARACT EXTRACTION Bilateral 2011  . EUS N/A 06/28/2015   Procedure: UPPER ENDOSCOPIC ULTRASOUND (EUS) LINEAR;  Surgeon: [name], MD;  Location: [facility];  Service: Endoscopy;  Laterality: N/A;  spoke with patient about new check in/procedure times JB  . EYE SURGERY    . FINE NEEDLE ASPIRATION  06/28/2015   Procedure: FINE NEEDLE ASPIRATION;  Surgeon: [name], MD;  Location: [facility];  Service: Endoscopy;;  . INGUINAL HERNIA REPAIR Right 08/27/2015   Procedure: OPEN RIGHT INGUINAL HERNIA ;  Surgeon: Ralene Ok, MD;  Location: [place];  Service: General;  Laterality: Right;  . INSERTION OF MESH Right 08/27/2015   Procedure: INSERTION OF MESH;  Surgeon: Ralene Ok, MD;  Location: [place];  Service: General;  Laterality: Right;  . LAPAROSCOPY N/A 08/27/2015   Procedure: LAPAROSCOPY DIAGNOSTIC;  Surgeon: Stark Klein, MD;  Location: [place];  Service: General;  Laterality: N/A;  . PROSTATECTOMY  08/2004   2nd to [place]  . ROTATOR CUFF REPAIR Bilateral 10/2008   11,10       Home Medications    Prior to Admission medications   Medication Sig Start Date End Date Taking? Authorizing Provider  capecitabine (XELODA) 500 MG tablet 1500 mg twice daily on days of radiation only Patient not taking: Reported on 11/30/2015 10/09/15   Ladell Pier, MD  lidocaine-prilocaine (EMLA) cream Apply to portacath site 1 hour prior to being  accessed Patient not taking: Reported on 11/30/2015 09/18/15   Owens Shark, NP  lisinopril (PRINIVIL,ZESTRIL) 20 MG tablet Take 1 tablet (20 mg total) by mouth daily. 06/05/15   Mancel Bale, PA-C  oxyCODONE-acetaminophen (ROXICET) 5-325 MG tablet Take 1-2 tablets by mouth every 4 (four) hours as needed. Patient not taking: Reported on 11/30/2015 08/27/15   Ralene Ok, MD  polyethylene glycol [facility]) packet Take 17 g by mouth 3 (three) times daily. 11/30/15 12/07/15  Fatima Blank, MD  prochlorperazine (COMPAZINE) 10 MG tablet Take 1 tablet (10 mg total) by mouth every 6 (six) hours as needed for nausea or vomiting. Patient not taking: Reported on 11/30/2015 09/18/15   Owens Shark, NP    Family History Family History  Problem Relation Age of Onset  . Thyroid disease Mother   . Prostate cancer Father   . Prostate cancer Brother     dx. late 34s; treated with stem cell therapy  . Prostate cancer Maternal Grandfather     d. middle ages  . Colon cancer Neg Hx     Social History Social History  Substance Use Topics  . Smoking status: Current Some Day Smoker    Packs/day: 0.25    Years: 10.00    Types: Cigarettes  . Smokeless tobacco: Never Used  . Alcohol use Yes     Comment: "three sips of beer a day"     Allergies   Review of patient's allergies indicates no known allergies.   Review of Systems Review of Systems  Gastrointestinal: Positive for abdominal pain.   10 Systems reviewed and are negative for acute change except as noted in the HPI.   Physical Exam Updated Vital Signs BP 99/64 (BP Location: Right Arm)   Temp 97.4 F (36.3 C) (Oral)   Resp 20   Physical Exam  Constitutional: He is oriented to person, place, and time. No distress.  Thin, frail appearing but awake and alert  HENT:  Head: Normocephalic and atraumatic.  Moist mucous membranes Dried blood on side of mouth and chin  Eyes: Conjunctivae are normal. Pupils are equal, round, and  reactive to light.  Neck: Neck supple.  Cardiovascular: Normal rate, regular rhythm and normal heart sounds.   No murmur heard. Pulmonary/Chest: Effort normal.  Occasional expiratory wheeze  Abdominal: Soft. Bowel sounds are normal. He exhibits no distension. There is tenderness.  Mild generalized ttp worst in midepigastrium and LUQ  Genitourinary: Rectal exam shows guaiac positive stool.  Genitourinary Comments: Normal rectal tone  Musculoskeletal: He exhibits no edema.  Neurological: He is alert and oriented to person, place, and time.  Fluent speech  Skin: Skin is warm and dry.  Psychiatric: He has a normal mood and affect. Judgment normal.  Nursing note and vitals reviewed. 10 Systems reviewed and are negative for acute change except as noted in the HPI.    ED Treatments /  Results  Labs (all labs ordered are listed, but only abnormal results are displayed) Labs Reviewed  CBC - Abnormal; Notable for the following:       Result Value   RBC 3.11 (*)    Hemoglobin 9.9 (*)    HCT 28.2 (*)    Platelets 425 (*)    All other components within normal limits  POC OCCULT BLOOD, ED - Abnormal; Notable for the following:    Fecal Occult Bld POSITIVE (*)    All other components within normal limits  COMPREHENSIVE METABOLIC PANEL  URINALYSIS, ROUTINE W REFLEX MICROSCOPIC (NOT AT [facility])  I-STAT CG4 LACTIC ACID, ED  TYPE AND SCREEN    EKG  EKG Interpretation None       Radiology Dg Chest 2 View  Result Date: 12/05/2015 CLINICAL DATA:  65 year old male with a history of hematemesis vs hemoptysis, h/o cancer, constipation EXAM: CHEST  2 VIEW COMPARISON:  CT 11/30/2015, 11/19/2015 FINDINGS: Cardiomediastinal silhouette within normal limits. No evidence of central vascular congestion. Lung volumes adequate, with coarsening of interstitial markings. No confluent airspace disease pneumothorax. No pleural effusion. Partially imaged surgical changes of the cervical region. No displaced  fracture. IMPRESSION: Chronic lung changes without evidence of superimposed acute cardiopulmonary disease. Signed, Dulcy Fanny. Earleen Newport, DO [facility] Radiology Electronically Signed   By: Corrie Mckusick D.O.   On: 12/05/2015 08:21   Dg Abd 1 View  Result Date: 12/05/2015 CLINICAL DATA:  65 year old male with a history of hemoptysis versus hematemesis EXAM: ABDOMEN - 1 VIEW COMPARISON:  CT 11/30/2015, 11/19/2015 FINDINGS: Gas within stomach, small bowel, colon.  No abnormal distention. No identified free air on this plain film series. Surgical changes of the upper abdomen, compatible with the patient's given imaging history. No displaced fracture. Degenerative changes of the spine. Pelvic calcifications. IMPRESSION: Nonobstructive bowel gas pattern. No free air identified on this plain film series. Surgical changes of the upper abdomen, compatible with the patient's given imaging history. Signed, Dulcy Fanny. Earleen Newport, DO [facility] Radiology Electronically Signed   By: Corrie Mckusick D.O.   On: 12/05/2015 08:28   Ct Angio Abd/pel W And/or Wo Contrast  Result Date: 12/05/2015 CLINICAL DATA:  65 year old male with a history of new hematemesis. Prior pancreatic pseudocyst with spontaneous decompression. Patient has a history of pancreatic cancer, prior laparoscopy, open distal subtotal pancreatectomy and splenectomy 08/27/2015. EXAM: CT ABDOMEN AND PELVIS WITH CONTRAST TECHNIQUE: Multidetector CT imaging of the abdomen and pelvis was performed using the standard protocol following bolus administration of intravenous contrast. CONTRAST:  100 cc IV Isovue 370 COMPARISON:  None. FINDINGS: Lower chest: Unremarkable. Hepatobiliary: No focal liver abnormality is seen. No gallstones, gallbladder wall thickening, or biliary dilatation. Pancreas: Patient is status post distal pancreatectomy for treatment of known pancreatic  carcinoma. Inflammatory changes replace the usual fat and mesenteric adjacent to the operative bed. The previous identified fistulous connection persists, with intermediate density material extending through wall of posterior stomach defect. There is gas tracking behind posterior wall of the stomach. Head of the pancreas enhances within normal limits. Spleen: Splenectomy Adrenals/Urinary Tract: Bilateral adrenal glands unremarkable. Right kidney unremarkable.  No hydronephrosis or nephrolithiasis. Left kidney unremarkable without hydronephrosis or nephrolithiasis. Course the bilateral ureters unremarkable. Unremarkable appearance of the urinary bladder. Stomach/Bowel: Persisting inflammatory changes posterior to the stomach, with defect in the posterior stomach wall measuring 3.3 cm. Soft tissue/ fluid/succus fills the stomach wall defect, and tracks into the retroperitoneal space Similar  appearance of decompressed pseudocyst in the surgical bed. Inflammatory changes extend along the distal stomach and the stomach antrum to the pylorus. No distention of small bowel or colon. No transition point. Normal appendix. Vascular/Lymphatic: Surgical changes of splenic artery ligation. Anomalous origin of left gastric artery, at the base of the celiac artery origin or potentially directly from the aorta. Small hyperenhancing/ hyperdense focus measuring 1.5 cm adjacent to the left gastric artery (image 36 of series 4). This is un chain on arterial and venous phase. Surgical clips adjacent to the wall of the decompressed pseudo sister unchanged. Edema throughout the upper abdomen mesenteric. Multiple lymph nodes of small bowel mesenteric. Scattered calcifications abdominal aorta. No dissection or aneurysm. Bilateral iliac system patent. Significant atherosclerotic changes of the proximal left SFA, incompletely imaged. Reproductive: Surgical changes of prostatectomy. Other: Surgical changes of inguinal hernia repair.  Musculoskeletal: No displaced fracture. Degenerative changes of the spine. IMPRESSION: No CT angiogram evidence of active extravasation. No pseudoaneurysm identified. Re- demonstration of pancreatic pseudocyst decompression through a large fistulous connection to the posterior stomach wall. Significant inflammatory changes are present surrounding the pseudo cyst, posterior stomach wall, and the surgical bed of the prior pancreatectomy. Phlegmonous material fills the residual pseudocyst, extending through the posterior stomach wall, potentially necrotic debris, enteric contents, or potentially blood products. Surgical changes of prior distal pancreatectomy and splenectomy. Reactive changes throughout the abdomen of multiple lymph nodes and edema/inflammation of the fat and mesenteric. Aortic atherosclerosis. These results were called by telephone at the time of interpretation on 12/05/2015 at 9:49 am to Dr. Theotis Burrow , who verbally acknowledged these results. Signed, Dulcy Fanny. Earleen Newport, DO [facility] Radiology Electronically Signed   By: Corrie Mckusick D.O.   On: 12/05/2015 09:52    Procedures Procedures (including critical care time)  Medications Ordered in ED Medications  sodium chloride 0.9 % bolus 1,000 mL (0 mLs Intravenous Stopped 12/05/15 1338)  iopamidol (ISOVUE-370) 76 % injection 100 mL (100 mLs Intravenous Contrast Given 12/05/15 0857)     Initial Impression / Assessment and Plan / ED Course  I have reviewed the triage vital signs and the nursing notes.  Pertinent labs & imaging results that were available during my care of the patient were reviewed by me and considered in my medical decision making (see chart for details).  Clinical Course   Patient with recent evaluation for abdominal pain, CT showing constipation, presents with ongoing abdominal pain as well as dark stools. He had an episode of hemoptysis versus hematemesis at home. He  was awake and alert, comfortable on exam. BP 99/64,O2 100% on room air. Obtained above lab work including lactate and type and screen. Hemoccult was positive. Gave IV bolus. Chest x-ray and KUB negative acute. Lactate normal, hemoglobin 9.9 which is slightly lower than previous around 11. Labs showed normal creatinine and reassuring CMP. Obtained CT angiogram of abdomen and pelvis to evaluate for active extravasation of blood as explanation for symptoms. CT did not show active extravasation but did show pancreatic pseudocyst formation with erosion into the stomach. I discussed these findings with gastroenterology mid-level provider who reviewed patient's chart and recommended that I contact general surgery. Discussed findings with Dr. Marcello Moores who will evaluate patient. Discussed admission with [facility] hospitalist and patient admitted for further evaluation and treatment.  Final Clinical Impressions(s) / ED Diagnoses   Final diagnoses:  Hematemesis, presence of nausea not specified  Melena    New Prescriptions New Prescriptions   No medications  on file     Sharlett Iles, MD 12/08/15 [phone]

## 2015-12-06 DIAGNOSIS — D62 Acute posthemorrhagic anemia: Secondary | ICD-10-CM

## 2015-12-06 LAB — COMPREHENSIVE METABOLIC PANEL
ALBUMIN: 2.6 g/dL — AB (ref 3.5–5.0)
ALK PHOS: 57 U/L (ref 38–126)
ALT: 10 U/L — ABNORMAL LOW (ref 17–63)
ANION GAP: 6 (ref 5–15)
AST: 16 U/L (ref 15–41)
BILIRUBIN TOTAL: 0.5 mg/dL (ref 0.3–1.2)
BUN: 17 mg/dL (ref 6–20)
CO2: 24 mmol/L (ref 22–32)
Calcium: 8.1 mg/dL — ABNORMAL LOW (ref 8.9–10.3)
Chloride: 105 mmol/L (ref 101–111)
Creatinine, Ser: 0.47 mg/dL — ABNORMAL LOW (ref 0.61–1.24)
GFR calc Af Amer: >60 mL/min (ref >60)
GFR calc non Af Amer: >60 mL/min (ref >60)
GLUCOSE: 90 mg/dL (ref 65–99)
POTASSIUM: 3.5 mmol/L (ref 3.5–5.1)
SODIUM: 135 mmol/L (ref 135–145)
TOTAL PROTEIN: 5.6 g/dL — AB (ref 6.5–8.1)

## 2015-12-06 LAB — CBC
HEMATOCRIT: 22.8 % — AB (ref 39.0–52.0)
HEMOGLOBIN: 7.7 g/dL — AB (ref 13.0–17.0)
MCH: 31 pg (ref 26.0–34.0)
MCHC: 33.8 g/dL (ref 30.0–36.0)
MCV: 91.9 fL (ref 78.0–100.0)
Platelets: 384 10*3/uL (ref 150–400)
RBC: 2.48 MIL/uL — ABNORMAL LOW (ref 4.22–5.81)
RDW: 15.8 % — ABNORMAL HIGH (ref 11.5–15.5)
WBC: 7.1 10*3/uL (ref 4.0–10.5)

## 2015-12-06 LAB — HEMOGLOBIN AND HEMATOCRIT, BLOOD
HCT: 24.1 % — ABNORMAL LOW (ref 39.0–52.0)
HEMATOCRIT: 22.7 % — AB (ref 39.0–52.0)
HEMATOCRIT: 22.3 % — AB (ref 39.0–52.0)
HEMOGLOBIN: 7.8 g/dL — AB (ref 13.0–17.0)
HEMOGLOBIN: 7.7 g/dL — AB (ref 13.0–17.0)
Hemoglobin: 8.4 g/dL — ABNORMAL LOW (ref 13.0–17.0)

## 2015-12-06 MED ORDER — FAMOTIDINE IN NACL 20-0.9 MG/50ML-% IV SOLN
20.0000 mg | Freq: Two times a day (BID) | INTRAVENOUS | Status: DC
  Administered 2015-12-06 (×2): 20 mg via INTRAVENOUS
  Filled 2015-12-06 (×2): qty 50

## 2015-12-06 NOTE — Progress Notes (Signed)
[facility] PROGRESS NOTE  Perry Robinson [id number] DOB: September 14, 1950 DOA: 12/05/2015 PCP: Windell Hummingbird, PA-C  Interim summary and HPI 65 y.o. male, with history of adenocarcinoma of pancreas, status post open distal subtotal pancreatectomy and splenectomy 08/27/2015, status post initiation of adjuvant Xeloda and radiation on 10/09/2015. Patient has a remote history of prostate cancer, status post prostatectomy.  The patient came to the hospital after he had one episode of hematemesis this morning. Patient says that he woke up with blood on his clothes, felt dizzy after he got up from bed. When he walked to the bathroom he was weak and fell back wall and slid down. He denies passing out. No chest shortness of breath. Also has noticed black colored stool. Denies frank blood in the stool.  In the ED CT angiogram of abdomen and pelvis showed pancreatic pseudocyst decompression through a large fistulous connection to the posterior stomach wall. Both GI and surgery were consulted by the ED physician.   Assessment/Plan: 1-hematemesis: likely from pseudocyst gastric wall fistula -no further GI bleed appreciated -will continue IV pepcid  -continue NPO status -CCS on board and dictating care; will follow rec's -Hgb 8.4 today; will follow trend   2-acute blood loss anemia: -due to #1 -will follow Hgb trend -no transfusion needed currently -continue supportive care  3-hx of pancreatic cancer:  -status post distal subtotal pancreatectomy and splenectomy  -continue follow up with oncology service as an outpatient -continue follow up with general surgery   4-HTN: -BP stable -will continue holding antihypertensive regimen    Code Status: DNR Family Communication: no family at bedside  Disposition Plan: remains inpatient, will follow H&H, follow general surgery recommendations.    Consultants:  CCS   Procedures:  See below for x-ray reports   Antibiotics:  None    HPI/Subjective: Afebrile, feeling better, no complaining of CP or SOB. Reports no further episodes of GI bleeding. Improvement in his abd pain.  Objective: Vitals:   12/06/15 0458 12/06/15 1025  BP: 135/80 120/73  Pulse: 75 81  Resp: 18 20  Temp: 98.4 F (36.9 C) 97.8 F (36.6 C)    Intake/Output Summary (Last 24 hours) at 12/06/15 1448 Last data filed at 12/06/15 0700  Gross per 24 hour  Intake           988.75 ml  Output                0 ml  Net           988.75 ml   Filed Weights   12/05/15 1650  Weight: 62.6 kg (138 lb)    Exam:   General:  Feeling a lot better. No CP, no SOB and reporting improvement in his abd discomfort. Patient endorses no further episodes of hematemesis or vomiting.  Cardiovascular: S1 and S2, no rubs, no gallops   Respiratory: good air movement, no wheezing, no crackles.. Good O2 sat on RA  Abdomen: soft, no guarding, mid epigastric tenderness on deep palpation; positive BS  Musculoskeletal: no edema, no cyanosis   Data Reviewed: Basic Metabolic Panel:  Recent Labs Lab 11/30/15 0934 12/05/15 0655 12/06/15 0457  NA 134* 131* 135  K 3.6 4.1 3.5  CL 99* 99* 105  CO2 28 25 24   GLUCOSE 131* 169* 90  BUN 13 16 17   CREATININE 0.47* 0.51* 0.47*  CALCIUM 8.8* 8.5* 8.1*   Liver Function Tests:  Recent Labs Lab 11/30/15 0934 12/05/15 0655 12/06/15 0457  AST 21 20 16   ALT  13* 12* 10*  ALKPHOS 67 69 57  BILITOT 0.4 0.8 0.5  PROT 6.2* 6.2* 5.6*  ALBUMIN 3.1* 3.1* 2.6*   CBC:  Recent Labs Lab 11/30/15 0934 12/05/15 0655 12/05/15 1659 12/06/15 0054 12/06/15 0457 12/06/15 1338  WBC 6.3 8.3  --   --  7.1  --   NEUTROABS 4.5  --   --   --   --   --   HGB 11.4* 9.9* 7.9* 7.7* 7.7* 8.4*  HCT 33.0* 28.2* 22.5* 22.3* 22.8* 24.1*  MCV 91.2 90.7  --   --  91.9  --   PLT 371 425*  --   --  384  --    CBG:  Recent Labs Lab 11/30/15 0842  GLUCAP 146*   Studies: Dg Chest 2 View  Result Date: 12/05/2015 CLINICAL DATA:   65 year old male with a history of hematemesis vs hemoptysis, h/o cancer, constipation EXAM: CHEST  2 VIEW COMPARISON:  CT 11/30/2015, 11/19/2015 FINDINGS: Cardiomediastinal silhouette within normal limits. No evidence of central vascular congestion. Lung volumes adequate, with coarsening of interstitial markings. No confluent airspace disease pneumothorax. No pleural effusion. Partially imaged surgical changes of the cervical region. No displaced fracture. IMPRESSION: Chronic lung changes without evidence of superimposed acute cardiopulmonary disease. Signed, Dulcy Fanny. Earleen Newport, DO [facility] Radiology Electronically Signed   By: Corrie Mckusick D.O.   On: 12/05/2015 08:21   Dg Abd 1 View  Result Date: 12/05/2015 CLINICAL DATA:  65 year old male with a history of hemoptysis versus hematemesis EXAM: ABDOMEN - 1 VIEW COMPARISON:  CT 11/30/2015, 11/19/2015 FINDINGS: Gas within stomach, small bowel, colon.  No abnormal distention. No identified free air on this plain film series. Surgical changes of the upper abdomen, compatible with the patient's given imaging history. No displaced fracture. Degenerative changes of the spine. Pelvic calcifications. IMPRESSION: Nonobstructive bowel gas pattern. No free air identified on this plain film series. Surgical changes of the upper abdomen, compatible with the patient's given imaging history. Signed, Dulcy Fanny. Earleen Newport, DO [facility] Radiology Electronically Signed   By: Corrie Mckusick D.O.   On: 12/05/2015 08:28   Ct Angio Abd/pel W And/or Wo Contrast  Result Date: 12/05/2015 CLINICAL DATA:  65 year old male with a history of new hematemesis. Prior pancreatic pseudocyst with spontaneous decompression. Patient has a history of pancreatic cancer, prior laparoscopy, open distal subtotal pancreatectomy and splenectomy 08/27/2015. EXAM: CT ABDOMEN AND PELVIS WITH CONTRAST TECHNIQUE:  Multidetector CT imaging of the abdomen and pelvis was performed using the standard protocol following bolus administration of intravenous contrast. CONTRAST:  100 cc IV Isovue 370 COMPARISON:  None. FINDINGS: Lower chest: Unremarkable. Hepatobiliary: No focal liver abnormality is seen. No gallstones, gallbladder wall thickening, or biliary dilatation. Pancreas: Patient is status post distal pancreatectomy for treatment of known pancreatic carcinoma. Inflammatory changes replace the usual fat and mesenteric adjacent to the operative bed. The previous identified fistulous connection persists, with intermediate density material extending through wall of posterior stomach defect. There is gas tracking behind posterior wall of the stomach. Head of the pancreas enhances within normal limits. Spleen: Splenectomy Adrenals/Urinary Tract: Bilateral adrenal glands unremarkable. Right kidney unremarkable.  No hydronephrosis or nephrolithiasis. Left kidney unremarkable without hydronephrosis or nephrolithiasis. Course the bilateral ureters unremarkable. Unremarkable appearance of the urinary bladder. Stomach/Bowel: Persisting inflammatory changes posterior to the stomach, with defect in the posterior stomach wall measuring 3.3 cm. Soft tissue/ fluid/succus fills the stomach wall defect, and tracks into the  retroperitoneal space Similar appearance of decompressed pseudocyst in the surgical bed. Inflammatory changes extend along the distal stomach and the stomach antrum to the pylorus. No distention of small bowel or colon. No transition point. Normal appendix. Vascular/Lymphatic: Surgical changes of splenic artery ligation. Anomalous origin of left gastric artery, at the base of the celiac artery origin or potentially directly from the aorta. Small hyperenhancing/ hyperdense focus measuring 1.5 cm adjacent to the left gastric artery (image 36 of series 4). This is un chain on arterial and venous phase. Surgical clips adjacent to  the wall of the decompressed pseudo sister unchanged. Edema throughout the upper abdomen mesenteric. Multiple lymph nodes of small bowel mesenteric. Scattered calcifications abdominal aorta. No dissection or aneurysm. Bilateral iliac system patent. Significant atherosclerotic changes of the proximal left SFA, incompletely imaged. Reproductive: Surgical changes of prostatectomy. Other: Surgical changes of inguinal hernia repair. Musculoskeletal: No displaced fracture. Degenerative changes of the spine. IMPRESSION: No CT angiogram evidence of active extravasation. No pseudoaneurysm identified. Re- demonstration of pancreatic pseudocyst decompression through a large fistulous connection to the posterior stomach wall. Significant inflammatory changes are present surrounding the pseudo cyst, posterior stomach wall, and the surgical bed of the prior pancreatectomy. Phlegmonous material fills the residual pseudocyst, extending through the posterior stomach wall, potentially necrotic debris, enteric contents, or potentially blood products. Surgical changes of prior distal pancreatectomy and splenectomy. Reactive changes throughout the abdomen of multiple lymph nodes and edema/inflammation of the fat and mesenteric. Aortic atherosclerosis. These results were called by telephone at the time of interpretation on 12/05/2015 at 9:49 am to Dr. Theotis Burrow , who verbally acknowledged these results. Signed, Dulcy Fanny. Earleen Newport, DO [facility] Radiology Electronically Signed   By: Corrie Mckusick D.O.   On: 12/05/2015 09:52    Scheduled Meds: . famotidine (PEPCID) IV  20 mg Intravenous Q12H   Continuous Infusions: . sodium chloride 75 mL/hr at 12/06/15 0548    Active Problems:   Hypertension   Prostate cancer ([place])   Carcinoma of body of pancreas ([place])   Family history of prostate cancer   Hematemesis    Time spent: 28 minutes    Barton Dubois  [facility] Pager [phone]. If 7PM-7AM, please contact night-coverage at [email or web address], password [facility] 12/06/2015, 2:48 PM  LOS: 1 day

## 2015-12-06 NOTE — Progress Notes (Signed)
[facility] Progress Note     Subjective: NAE. C/o crampy lower abdominal pain at 2:30 AM that resolved after receiving pain medication. Last episode of hematemesis was yesterday at 5:00 AM. Last BM was yesterday afternoon and was loose/dark green, states his stools are not as dark/black as they were a couple days ago. Denies dizziness, palpitations, abdominal pain and hematochezia.  Patient requesting help with long term disability paperwork.  Objective: Vital signs in last 24 hours: Temp:  [97.8 F (36.6 C)-98.4 F (36.9 C)] 98.4 F (36.9 C) (10/05 0458) Pulse Rate:  [72-92] 75 (10/05 0458) Resp:  [17-24] 18 (10/05 0458) BP: (105-135)/(53-80) 135/80 (10/05 0458) SpO2:  [97 %-100 %] 100 % (10/05 0458) Weight:  [138 lb (62.6 kg)] 138 lb (62.6 kg) (10/04 1650) Last BM Date: 12/05/15  Intake/Output from previous day: 10/04 0701 - 10/05 0700 In: 1988.8 [I.V.:988.8; IV Piggyback:1000] Out: -  Intake/Output this shift: No intake/output data recorded.  PE: Gen:  Alert, NAD, pleasant HEENT: mucuos membranes moist and pink, no lesions or palatal petechiae  Pulm:  CTA, no W/R/R Abd: Soft, NT/ND, +BS, previous laparotomy scar   Lab Results:   Recent Labs  12/05/15 0655  12/06/15 0054 12/06/15 0457  WBC 8.3  --   --  7.1  HGB 9.9*  < > 7.7* 7.7*  HCT 28.2*  < > 22.3* 22.8*  PLT 425*  --   --  384  < > = values in this interval not displayed. BMET  Recent Labs  12/05/15 0655 12/06/15 0457  NA 131* 135  K 4.1 3.5  CL 99* 105  CO2 25 24  GLUCOSE 169* 90  BUN 16 17  CREATININE 0.51* 0.47*  CALCIUM 8.5* 8.1*   CMP     Component Value Date/Time   NA 135 12/06/2015 0457   NA 140 10/09/2015 1001   K 3.5 12/06/2015 0457   K 3.7 10/09/2015 1001   CL 105 12/06/2015 0457   CO2 24 12/06/2015 0457   CO2 25 10/09/2015 1001   GLUCOSE 90 12/06/2015 0457   GLUCOSE 167 (H) 10/09/2015 1001   BUN 17 12/06/2015 0457   BUN 11.0 10/09/2015 1001   CREATININE 0.47  (L) 12/06/2015 0457   CREATININE 0.7 10/09/2015 1001   CALCIUM 8.1 (L) 12/06/2015 0457   CALCIUM 9.6 10/09/2015 1001   PROT 5.6 (L) 12/06/2015 0457   PROT 7.1 10/09/2015 1001   ALBUMIN 2.6 (L) 12/06/2015 0457   ALBUMIN 3.7 10/09/2015 1001   AST 16 12/06/2015 0457   AST 23 10/09/2015 1001   ALT 10 (L) 12/06/2015 0457   ALT 17 10/09/2015 1001   ALKPHOS 57 12/06/2015 0457   ALKPHOS 94 10/09/2015 1001   BILITOT 0.5 12/06/2015 0457   BILITOT 0.36 10/09/2015 1001   GFRNONAA >60 12/06/2015 0457   GFRNONAA >89 06/05/2015 0911   GFRAA >60 12/06/2015 0457   GFRAA >89 06/05/2015 0911   Studies/Results: Dg Chest 2 View  Result Date: 12/05/2015 CLINICAL DATA:  65 year old male with a history of hematemesis vs hemoptysis, h/o cancer, constipation EXAM: CHEST  2 VIEW COMPARISON:  CT 11/30/2015, 11/19/2015 FINDINGS: Cardiomediastinal silhouette within normal limits. No evidence of central vascular congestion. Lung volumes adequate, with coarsening of interstitial markings. No confluent airspace disease pneumothorax. No pleural effusion. Partially imaged surgical changes of the cervical region. No displaced fracture. IMPRESSION: Chronic lung changes without evidence of superimposed acute cardiopulmonary disease. Signed, Dulcy Fanny. Earleen Newport, DO [facility] Radiology Electronically  Signed   By: Corrie Mckusick D.O.   On: 12/05/2015 08:21   Dg Abd 1 View  Result Date: 12/05/2015 CLINICAL DATA:  65 year old male with a history of hemoptysis versus hematemesis EXAM: ABDOMEN - 1 VIEW COMPARISON:  CT 11/30/2015, 11/19/2015 FINDINGS: Gas within stomach, small bowel, colon.  No abnormal distention. No identified free air on this plain film series. Surgical changes of the upper abdomen, compatible with the patient's given imaging history. No displaced fracture. Degenerative changes of the spine. Pelvic calcifications. IMPRESSION: Nonobstructive bowel gas pattern. No free  air identified on this plain film series. Surgical changes of the upper abdomen, compatible with the patient's given imaging history. Signed, Dulcy Fanny. Earleen Newport, DO [facility] Radiology Electronically Signed   By: Corrie Mckusick D.O.   On: 12/05/2015 08:28   Ct Angio Abd/pel W And/or Wo Contrast  Result Date: 12/05/2015 CLINICAL DATA:  65 year old male with a history of new hematemesis. Prior pancreatic pseudocyst with spontaneous decompression. Patient has a history of pancreatic cancer, prior laparoscopy, open distal subtotal pancreatectomy and splenectomy 08/27/2015. EXAM: CT ABDOMEN AND PELVIS WITH CONTRAST TECHNIQUE: Multidetector CT imaging of the abdomen and pelvis was performed using the standard protocol following bolus administration of intravenous contrast. CONTRAST:  100 cc IV Isovue 370 COMPARISON:  None. FINDINGS: Lower chest: Unremarkable. Hepatobiliary: No focal liver abnormality is seen. No gallstones, gallbladder wall thickening, or biliary dilatation. Pancreas: Patient is status post distal pancreatectomy for treatment of known pancreatic carcinoma. Inflammatory changes replace the usual fat and mesenteric adjacent to the operative bed. The previous identified fistulous connection persists, with intermediate density material extending through wall of posterior stomach defect. There is gas tracking behind posterior wall of the stomach. Head of the pancreas enhances within normal limits. Spleen: Splenectomy Adrenals/Urinary Tract: Bilateral adrenal glands unremarkable. Right kidney unremarkable.  No hydronephrosis or nephrolithiasis. Left kidney unremarkable without hydronephrosis or nephrolithiasis. Course the bilateral ureters unremarkable. Unremarkable appearance of the urinary bladder. Stomach/Bowel: Persisting inflammatory changes posterior to the stomach, with defect in the posterior stomach wall measuring 3.3 cm. Soft tissue/ fluid/succus  fills the stomach wall defect, and tracks into the retroperitoneal space Similar appearance of decompressed pseudocyst in the surgical bed. Inflammatory changes extend along the distal stomach and the stomach antrum to the pylorus. No distention of small bowel or colon. No transition point. Normal appendix. Vascular/Lymphatic: Surgical changes of splenic artery ligation. Anomalous origin of left gastric artery, at the base of the celiac artery origin or potentially directly from the aorta. Small hyperenhancing/ hyperdense focus measuring 1.5 cm adjacent to the left gastric artery (image 36 of series 4). This is un chain on arterial and venous phase. Surgical clips adjacent to the wall of the decompressed pseudo sister unchanged. Edema throughout the upper abdomen mesenteric. Multiple lymph nodes of small bowel mesenteric. Scattered calcifications abdominal aorta. No dissection or aneurysm. Bilateral iliac system patent. Significant atherosclerotic changes of the proximal left SFA, incompletely imaged. Reproductive: Surgical changes of prostatectomy. Other: Surgical changes of inguinal hernia repair. Musculoskeletal: No displaced fracture. Degenerative changes of the spine. IMPRESSION: No CT angiogram evidence of active extravasation. No pseudoaneurysm identified. Re- demonstration of pancreatic pseudocyst decompression through a large fistulous connection to the posterior stomach wall. Significant inflammatory changes are present surrounding the pseudo cyst, posterior stomach wall, and the surgical bed of the prior pancreatectomy. Phlegmonous material fills the residual pseudocyst, extending through the posterior stomach wall, potentially necrotic debris, enteric contents, or potentially blood products. Surgical changes  of prior distal pancreatectomy and splenectomy. Reactive changes throughout the abdomen of multiple lymph nodes and edema/inflammation of the fat and mesenteric. Aortic atherosclerosis. These results  were called by telephone at the time of interpretation on 12/05/2015 at 9:49 am to Dr. Theotis Burrow , who verbally acknowledged these results. Signed, Dulcy Fanny. Earleen Newport, DO [facility] Radiology Electronically Signed   By: Corrie Mckusick D.O.   On: 12/05/2015 09:52   Assessment/Plan Pancreatic pseudocyst with erosion into the stomach Hematemesis  - S/p distal pancreatectomy and splenectomy 08/27/15 (Dr, [name]) for pancreatic adenocarcinoma; Xeloda and radiation 10/2015 - anemia - Hgb/Hct 7.7/22.8, continue serial H&H - continue NPO status  FEN: NPO VTE: SCD's Code status: DNR  Plan: Continue non-surgical management and serial H&H. If H&H remain stable, may advance diet to clear liquids tomorrow. Will discuss further recommendations with MD.   LOS: 1 day    Jill Alexanders , [facility] 12/06/2015, 7:34 AM Pager: [phone] Consults: [phone] Mon-Fri 7:00 am-4:30 pm Sat-Sun 7:00 am-11:30 am

## 2015-12-07 DIAGNOSIS — E43 Unspecified severe protein-calorie malnutrition: Secondary | ICD-10-CM

## 2015-12-07 LAB — HEMOGLOBIN AND HEMATOCRIT, BLOOD
HCT: 26.8 % — ABNORMAL LOW (ref 39.0–52.0)
Hemoglobin: 9 g/dL — ABNORMAL LOW (ref 13.0–17.0)

## 2015-12-07 LAB — GLUCOSE, CAPILLARY
GLUCOSE-CAPILLARY: 58 mg/dL — AB (ref 65–99)
GLUCOSE-CAPILLARY: 132 mg/dL — AB (ref 65–99)
Glucose-Capillary: 54 mg/dL — ABNORMAL LOW (ref 65–99)
Glucose-Capillary: 178 mg/dL — ABNORMAL HIGH (ref 65–99)

## 2015-12-07 MED ORDER — ENSURE ENLIVE PO LIQD
237.0000 mL | Freq: Two times a day (BID) | ORAL | Status: DC
  Administered 2015-12-07 – 2015-12-08 (×2): 237 mL via ORAL

## 2015-12-07 MED ORDER — FAMOTIDINE 20 MG PO TABS
20.0000 mg | ORAL_TABLET | Freq: Every day | ORAL | Status: DC
  Administered 2015-12-07: 20 mg via ORAL
  Filled 2015-12-07: qty 1

## 2015-12-07 MED ORDER — PANTOPRAZOLE SODIUM 40 MG PO TBEC
40.0000 mg | DELAYED_RELEASE_TABLET | Freq: Every day | ORAL | Status: DC
  Administered 2015-12-07 – 2015-12-08 (×2): 40 mg via ORAL
  Filled 2015-12-07 (×2): qty 1

## 2015-12-07 NOTE — Progress Notes (Signed)
[facility] PROGRESS NOTE  Perry Robinson [id number] DOB: [birth year] DOA: 12/05/2015 PCP: Windell Hummingbird, PA-C  Interim summary and HPI 65 y.o. male, with history of adenocarcinoma of pancreas, status post open distal subtotal pancreatectomy and splenectomy 08/27/2015, status post initiation of adjuvant Xeloda and radiation on 10/09/2015. Patient has a remote history of prostate cancer, status post prostatectomy.  The patient came to the hospital after he had one episode of hematemesis this morning. Patient says that he woke up with blood on his clothes, felt dizzy after he got up from bed. When he walked to the bathroom he was weak and fell back wall and slid down. He denies passing out. No chest shortness of breath. Also has noticed black colored stool. Denies frank blood in the stool.  In the ED CT angiogram of abdomen and pelvis showed pancreatic pseudocyst decompression through a large fistulous connection to the posterior stomach wall. Both GI and surgery were consulted by the ED physician.   Assessment/Plan: 1-hematemesis: likely from pseudocyst gastric wall fistula -no further GI bleed appreciated -will have diet advance, pepcid and protonix started by mouth -CCS on board and dictating care; will follow rec's -Hgb 9.0 today; will follow trend   2-acute blood loss anemia: -due to #1 -will follow Hgb trend -no transfusion needed currently -continue supportive care -Hgb 9.0 today  3-hx of pancreatic cancer:  -status post distal subtotal pancreatectomy and splenectomy  -continue follow up with oncology service as an outpatient -continue follow up with general surgery   4-HTN: -BP stable, will monitor  -will continue holding antihypertensive regimen for now  5-protein calorie malnutrition ([place]) -will start feeding supplement  Code Status: DNR Family Communication: no family at bedside  Disposition Plan: remains inpatient, will follow H&H, follow general  surgery recommendations.    Consultants:  CCS   Procedures:  See below for x-ray reports   Antibiotics:  None   HPI/Subjective: Afebrile, feeling better, no complaining of CP or SOB. Reports no further episodes of GI bleeding. Continue to have Improvement in his abd pain and is tolerating CLD.  Objective: Vitals:   12/06/15 2049 12/07/15 0608  BP: 129/60 140/62  Pulse: 65 82  Resp: 18 18  Temp: 98.6 F (37 C) 98 F (36.7 C)    Intake/Output Summary (Last 24 hours) at 12/07/15 1420 Last data filed at 12/07/15 0200  Gross per 24 hour  Intake             1525 ml  Output                0 ml  Net             1525 ml   Filed Weights   12/05/15 1650  Weight: 62.6 kg (138 lb)    Exam:   General:  Feeling a lot better. No CP, no SOB and continue reporting improvement in his abd discomfort. Patient endorses no further episodes of hematemesis or vomiting. Tolerating CLD.  Cardiovascular: S1 and S2, no rubs, no gallops   Respiratory: good air movement, no wheezing, no crackles.. Good O2 sat on RA  Abdomen: soft, no guarding, still with mild mid epigastric tenderness on deep palpation; positive BS  Musculoskeletal: no edema, no cyanosis   Data Reviewed: Basic Metabolic Panel:  Recent Labs Lab 12/05/15 0655 12/06/15 0457  NA 131* 135  K 4.1 3.5  CL 99* 105  CO2 25 24  GLUCOSE 169* 90  BUN 16 17  CREATININE 0.51* 0.47*  CALCIUM  8.5* 8.1*   Liver Function Tests:  Recent Labs Lab 12/05/15 0655 12/06/15 0457  AST 20 16  ALT 12* 10*  ALKPHOS 69 57  BILITOT 0.8 0.5  PROT 6.2* 5.6*  ALBUMIN 3.1* 2.6*   CBC:  Recent Labs Lab 12/05/15 0655  12/06/15 0054 12/06/15 0457 12/06/15 1338 12/06/15 1819 12/07/15 0559  WBC 8.3  --   --  7.1  --   --   --   HGB 9.9*  < > 7.7* 7.7* 8.4* 7.8* 9.0*  HCT 28.2*  < > 22.3* 22.8* 24.1* 22.7* 26.8*  MCV 90.7  --   --  91.9  --   --   --   PLT 425*  --   --  384  --   --   --   < > = values in this interval not  displayed. CBG:  Recent Labs Lab 12/07/15 0805 12/07/15 0828 12/07/15 1220  GLUCAP 58* 54* 178*   Studies: No results found.  Scheduled Meds: . famotidine  20 mg Oral QHS  . pantoprazole  40 mg Oral Daily   Continuous Infusions: . sodium chloride 50 mL/hr at 12/07/15 [id number]    Active Problems:   Hypertension   Prostate cancer ([place])   Carcinoma of body of pancreas ([place])   Family history of prostate cancer   Hematemesis    Time spent: 90 minutes    Barton Dubois  [facility] Pager [phone]. If 7PM-7AM, please contact night-coverage at [email or web address], password [facility] 12/07/2015, 2:20 PM  LOS: 2 days

## 2015-12-07 NOTE — Progress Notes (Signed)
[facility] Progress Note     Subjective: Abd pain better, no nausea    Objective: Vital signs in last 24 hours: Temp:  [97.8 F (36.6 C)-98.6 F (37 C)] 98 F (36.7 C) (10/06 [id number]) Pulse Rate:  [65-82] 82 (10/06 0608) Resp:  [18-20] 18 (10/06 [id number]) BP: (117-140)/(60-73) 140/62 (10/06 0608) SpO2:  [100 %] 100 % (10/06 [id number]) Last BM Date: 12/06/15  Intake/Output from previous day: 10/05 0701 - 10/06 0700 In: 1525 [I.V.:1425; IV Piggyback:100] Out: -  Intake/Output this shift: No intake/output data recorded.  PE: Gen:  Alert, NAD, pleasant HEENT: mucuos membranes moist and pink, no lesions or palatal petechiae  Pulm:  CTA, no W/R/R Abd: Soft, NT/ND, +BS, previous laparotomy scar   Lab Results:   Recent Labs  12/05/15 0655  12/06/15 0457  12/06/15 1819 12/07/15 0559  WBC 8.3  --  7.1  --   --   --   HGB 9.9*  < > 7.7*  < > 7.8* 9.0*  HCT 28.2*  < > 22.8*  < > 22.7* 26.8*  PLT 425*  --  384  --   --   --   < > = values in this interval not displayed. BMET  Recent Labs  12/05/15 0655 12/06/15 0457  NA 131* 135  K 4.1 3.5  CL 99* 105  CO2 25 24  GLUCOSE 169* 90  BUN 16 17  CREATININE 0.51* 0.47*  CALCIUM 8.5* 8.1*   CMP     Component Value Date/Time   NA 135 12/06/2015 0457   NA 140 10/09/2015 1001   K 3.5 12/06/2015 0457   K 3.7 10/09/2015 1001   CL 105 12/06/2015 0457   CO2 24 12/06/2015 0457   CO2 25 10/09/2015 1001   GLUCOSE 90 12/06/2015 0457   GLUCOSE 167 (H) 10/09/2015 1001   BUN 17 12/06/2015 0457   BUN 11.0 10/09/2015 1001   CREATININE 0.47 (L) 12/06/2015 0457   CREATININE 0.7 10/09/2015 1001   CALCIUM 8.1 (L) 12/06/2015 0457   CALCIUM 9.6 10/09/2015 1001   PROT 5.6 (L) 12/06/2015 0457   PROT 7.1 10/09/2015 1001   ALBUMIN 2.6 (L) 12/06/2015 0457   ALBUMIN 3.7 10/09/2015 1001   AST 16 12/06/2015 0457   AST 23 10/09/2015 1001   ALT 10 (L) 12/06/2015 0457   ALT 17 10/09/2015 1001   ALKPHOS 57 12/06/2015 0457    ALKPHOS 94 10/09/2015 1001   BILITOT 0.5 12/06/2015 0457   BILITOT 0.36 10/09/2015 1001   GFRNONAA >60 12/06/2015 0457   GFRNONAA >89 06/05/2015 0911   GFRAA >60 12/06/2015 0457   GFRAA >89 06/05/2015 0911   Studies/Results: Dg Chest 2 View  Result Date: 12/05/2015 CLINICAL DATA:  65 year old male with a history of hematemesis vs hemoptysis, h/o cancer, constipation EXAM: CHEST  2 VIEW COMPARISON:  CT 11/30/2015, 11/19/2015 FINDINGS: Cardiomediastinal silhouette within normal limits. No evidence of central vascular congestion. Lung volumes adequate, with coarsening of interstitial markings. No confluent airspace disease pneumothorax. No pleural effusion. Partially imaged surgical changes of the cervical region. No displaced fracture. IMPRESSION: Chronic lung changes without evidence of superimposed acute cardiopulmonary disease. Signed, Dulcy Fanny. Earleen Newport, DO [facility] Radiology Electronically Signed   By: Corrie Mckusick D.O.   On: 12/05/2015 08:21   Dg Abd 1 View  Result Date: 12/05/2015 CLINICAL DATA:  65 year old male with a history of hemoptysis versus hematemesis EXAM: ABDOMEN - 1 VIEW COMPARISON:  CT 11/30/2015, 11/19/2015 FINDINGS:  Gas within stomach, small bowel, colon.  No abnormal distention. No identified free air on this plain film series. Surgical changes of the upper abdomen, compatible with the patient's given imaging history. No displaced fracture. Degenerative changes of the spine. Pelvic calcifications. IMPRESSION: Nonobstructive bowel gas pattern. No free air identified on this plain film series. Surgical changes of the upper abdomen, compatible with the patient's given imaging history. Signed, Dulcy Fanny. Earleen Newport, DO [facility] Radiology Electronically Signed   By: Corrie Mckusick D.O.   On: 12/05/2015 08:28   Ct Angio Abd/pel W And/or Wo Contrast  Result Date: 12/05/2015 CLINICAL DATA:   65 year old male with a history of new hematemesis. Prior pancreatic pseudocyst with spontaneous decompression. Patient has a history of pancreatic cancer, prior laparoscopy, open distal subtotal pancreatectomy and splenectomy 08/27/2015. EXAM: CT ABDOMEN AND PELVIS WITH CONTRAST TECHNIQUE: Multidetector CT imaging of the abdomen and pelvis was performed using the standard protocol following bolus administration of intravenous contrast. CONTRAST:  100 cc IV Isovue 370 COMPARISON:  None. FINDINGS: Lower chest: Unremarkable. Hepatobiliary: No focal liver abnormality is seen. No gallstones, gallbladder wall thickening, or biliary dilatation. Pancreas: Patient is status post distal pancreatectomy for treatment of known pancreatic carcinoma. Inflammatory changes replace the usual fat and mesenteric adjacent to the operative bed. The previous identified fistulous connection persists, with intermediate density material extending through wall of posterior stomach defect. There is gas tracking behind posterior wall of the stomach. Head of the pancreas enhances within normal limits. Spleen: Splenectomy Adrenals/Urinary Tract: Bilateral adrenal glands unremarkable. Right kidney unremarkable.  No hydronephrosis or nephrolithiasis. Left kidney unremarkable without hydronephrosis or nephrolithiasis. Course the bilateral ureters unremarkable. Unremarkable appearance of the urinary bladder. Stomach/Bowel: Persisting inflammatory changes posterior to the stomach, with defect in the posterior stomach wall measuring 3.3 cm. Soft tissue/ fluid/succus fills the stomach wall defect, and tracks into the retroperitoneal space Similar appearance of decompressed pseudocyst in the surgical bed. Inflammatory changes extend along the distal stomach and the stomach antrum to the pylorus. No distention of small bowel or colon. No transition point. Normal appendix. Vascular/Lymphatic: Surgical changes of splenic artery ligation. Anomalous origin  of left gastric artery, at the base of the celiac artery origin or potentially directly from the aorta. Small hyperenhancing/ hyperdense focus measuring 1.5 cm adjacent to the left gastric artery (image 36 of series 4). This is un chain on arterial and venous phase. Surgical clips adjacent to the wall of the decompressed pseudo sister unchanged. Edema throughout the upper abdomen mesenteric. Multiple lymph nodes of small bowel mesenteric. Scattered calcifications abdominal aorta. No dissection or aneurysm. Bilateral iliac system patent. Significant atherosclerotic changes of the proximal left SFA, incompletely imaged. Reproductive: Surgical changes of prostatectomy. Other: Surgical changes of inguinal hernia repair. Musculoskeletal: No displaced fracture. Degenerative changes of the spine. IMPRESSION: No CT angiogram evidence of active extravasation. No pseudoaneurysm identified. Re- demonstration of pancreatic pseudocyst decompression through a large fistulous connection to the posterior stomach wall. Significant inflammatory changes are present surrounding the pseudo cyst, posterior stomach wall, and the surgical bed of the prior pancreatectomy. Phlegmonous material fills the residual pseudocyst, extending through the posterior stomach wall, potentially necrotic debris, enteric contents, or potentially blood products. Surgical changes of prior distal pancreatectomy and splenectomy. Reactive changes throughout the abdomen of multiple lymph nodes and edema/inflammation of the fat and mesenteric. Aortic atherosclerosis. These results were called by telephone at the time of interpretation on 12/05/2015 at 9:49 am to Dr. Theotis Burrow , who  verbally acknowledged these results. Signed, Dulcy Fanny. Earleen Newport, DO [facility] Radiology Electronically Signed   By: Corrie Mckusick D.O.   On: 12/05/2015 09:52   Assessment/Plan Pancreatic pseudocyst with erosion into the  stomach Hematemesis  - S/p distal pancreatectomy and splenectomy 08/27/15 (Dr, [name]) for pancreatic adenocarcinoma; Xeloda and radiation 10/2015 - anemia - Hgb rising - continue NPO status  FEN: clears, advance to fulls if tolerated VTE: SCD's Code status: DNR  Plan: Continue non-surgical management and serial H&H. If H&H remain stable and pt tolerates liquids, can probably go home tom with f/u with Dr [name].  Can use carafate if he has pain with eating.  Rec full liquid diet x 2 weeks.    LOS: 2 days    Rosario Adie, MD  [facility]

## 2015-12-08 DIAGNOSIS — D62 Acute posthemorrhagic anemia: Secondary | ICD-10-CM

## 2015-12-08 DIAGNOSIS — E43 Unspecified severe protein-calorie malnutrition: Secondary | ICD-10-CM

## 2015-12-08 DIAGNOSIS — K921 Melena: Secondary | ICD-10-CM

## 2015-12-08 LAB — CBC
HEMATOCRIT: 21.2 % — AB (ref 39.0–52.0)
Hemoglobin: 7.4 g/dL — ABNORMAL LOW (ref 13.0–17.0)
MCH: 30.6 pg (ref 26.0–34.0)
MCHC: 34.9 g/dL (ref 30.0–36.0)
MCV: 87.6 fL (ref 78.0–100.0)
Platelets: 401 10*3/uL — ABNORMAL HIGH (ref 150–400)
RBC: 2.42 MIL/uL — ABNORMAL LOW (ref 4.22–5.81)
RDW: 15.3 % (ref 11.5–15.5)
WBC: 4.3 10*3/uL (ref 4.0–10.5)

## 2015-12-08 LAB — PREPARE RBC (CROSSMATCH)

## 2015-12-08 MED ORDER — LISINOPRIL 20 MG PO TABS: 20.0000 mg | ORAL_TABLET | Freq: Every day | ORAL | Status: DC

## 2015-12-08 MED ORDER — SODIUM CHLORIDE 0.9 % IV SOLN
Freq: Once | INTRAVENOUS | Status: AC
  Administered 2015-12-08: 11:00:00 via INTRAVENOUS

## 2015-12-08 MED ORDER — POLYETHYLENE GLYCOL 3350 17 G PO PACK: 17.0000 g | PACK | Freq: Every day | ORAL | 0 refills | Status: DC | PRN

## 2015-12-08 MED ORDER — IRON POLYSACCH CMPLX-B12-FA 150-0.025-1 MG PO CAPS: 1.0000 | ORAL_CAPSULE | Freq: Every day | ORAL | 2 refills | Status: DC

## 2015-12-08 MED ORDER — ENSURE ENLIVE PO LIQD: 237.0000 mL | Freq: Two times a day (BID) | ORAL | 12 refills | Status: DC

## 2015-12-08 MED ORDER — FAMOTIDINE 20 MG PO TABS: 20.0000 mg | ORAL_TABLET | Freq: Every day | ORAL | 1 refills | Status: DC

## 2015-12-08 MED ORDER — PANTOPRAZOLE SODIUM 40 MG PO TBEC: 40.0000 mg | DELAYED_RELEASE_TABLET | Freq: Two times a day (BID) | ORAL | 1 refills | Status: DC

## 2015-12-08 NOTE — Discharge Summary (Signed)
Physician Discharge Summary  Perry Robinson [id number] DOB: Dec 28, 1950 DOA: 12/05/2015  PCP: Windell Hummingbird, PA-C  Admit date: 12/05/2015 Discharge date: 12/08/2015  Time spent: 35 minutes  Recommendations for Outpatient Follow-up:  1. Repeat CBC to follow up Hgb trend  2. Reassess BP and adjust/re-start antihypertensive regimen as needed    Discharge Diagnoses:  Active Problems:   Essential hypertension   Prostate cancer ([place])   Carcinoma of body of pancreas ([place])   Family history of prostate cancer   Hematemesis   Melena   Protein-calorie malnutrition, severe ([place])   Acute blood loss anemia   Discharge Condition: stable and improved. Discharge home with instructions to follow up with PCP in 5-7 days and with [name] (geenral surgery) in 2 weeks.  Diet recommendation: full liquid diet and watching sodium intake. Also encourage to use ensure BID  Filed Weights   12/05/15 1650  Weight: 62.6 kg (138 lb)    History of present illness:  65 y.o.male,with history of adenocarcinoma of pancreas, status post open distal subtotal pancreatectomy and splenectomy 08/27/2015,status post initiation of adjuvant Xeloda and radiation on 10/09/2015.Patient has a remote history of prostate cancer,status post prostatectomy.  The patient came to the hospital after he had one episode of hematemesis this morning. Patient says that he woke up with blood on his clothes,felt dizzy after he got up from bed.When he walked to the bathroom he was weak and fell back wall and slid down.He denies passing out. No chest shortness of breath. Also has noticed black colored stool. Denies frank blood in the stool.  In the ED CT angiogram of abdomen and pelvis showed pancreatic pseudocyst decompression through a large fistulous connection to the posterior stomach wall.Both GI and surgery were consulted by the ED physician.  Hospital Course:  1-hematemesis: likely from pseudocyst gastric wall  fistula -no further GI bleed appreciated -will follow full liquid diet until follow up with general surgery  -will continue PPI BID and Pepcid QHS -follow up in 2 weeks with [name]   2-acute blood loss anemia: -due to #1 -patient received 1 units of PRBC's during this admission  -hemodynamically stable and in no distress or expressing any symptomatic anemia features at discharge  -will discharge on niferex -CBC in 1 week at follow up visit with PCP to reassess Hgb trend   3-hx of pancreatic cancer:  -status post distal subtotal pancreatectomy and splenectomy  -continue follow up with oncology service as an outpatient -continue follow up with general surgery   4-HTN: -BP stable, w/o use of antihypertensive agents -given recent GIB and soft BP, will recommend to hold his lisinopril until follow up with PCP -advise to follow heart healthy diet   5-protein calorie malnutrition (severe) -will recommend starting feeding supplement BID  *rest of medical problems remains stable; plan is to continue current medication regimen and to follow up with PCP as an outpatient   Procedures:  See below for x-ray reports   Consultations:  General surgery   Discharge Exam: Vitals:   12/08/15 1028 12/08/15 1100  BP: 112/67 111/62  Pulse: 66 65  Resp: 16 16  Temp: 98.1 F (36.7 C) 98.3 F (36.8 C)    General:  Feeling a lot better. No CP, no SOB and continue reporting significant  improvement in his abd discomfort. Patient endorses no further episodes of hematemesis or vomiting. Tolerating full liquid diet and PO meds prior to discharge. Reports still some intermittent dark stools (but better than melanotic appearance before)  Cardiovascular: S1 and S2, no rubs, no gallops   Respiratory: good air movement, no wheezing, no crackles.. Good O2 sat on RA  Abdomen: soft, no guarding, still with mild mid epigastric tenderness on deep palpation; positive BS  Musculoskeletal: no edema,  no cyanosis    Discharge Instructions   Discharge Instructions    Diet - low sodium heart healthy    Complete by:  As directed    Discharge instructions    Complete by:  As directed    Follow full liquid diet until follow up with general surgery Follow up with [name] (general surgery) in 2 weeks Arrange follow up with PCP in 5-7 days Maintain yourself well hydrated Watch the amount of sodium intake in your diet Please hold lisinopril until follow up with your PCP     Current Discharge Medication List    START taking these medications   Details  famotidine (PEPCID) 20 MG tablet Take 1 tablet (20 mg total) by mouth at bedtime. Qty: 30 tablet, Refills: 1    feeding supplement, ENSURE ENLIVE, (ENSURE ENLIVE) LIQD Take 237 mLs by mouth 2 (two) times daily between meals. Qty: 237 mL, Refills: 12    Iron Polysacch Cmplx-B12-FA 150-0.025-1 MG CAPS Take 1 capsule by mouth daily. Qty: 30 each, Refills: 2    pantoprazole (PROTONIX) 40 MG tablet Take 1 tablet (40 mg total) by mouth 2 (two) times daily. Qty: 60 tablet, Refills: 1      CONTINUE these medications which have CHANGED   Details  lisinopril (PRINIVIL,ZESTRIL) 20 MG tablet Take 1 tablet (20 mg total) by mouth daily. Hold until follow up with PCP   Associated Diagnoses: Essential hypertension    polyethylene glycol (MIRALAX) packet Take 17 g by mouth daily as needed for mild constipation. Qty: 21 packet, Refills: 0      CONTINUE these medications which have NOT CHANGED   Details  oxyCODONE-acetaminophen (ROXICET) 5-325 MG tablet Take 1-2 tablets by mouth every 4 (four) hours as needed. Qty: 30 tablet, Refills: 0    prochlorperazine (COMPAZINE) 10 MG tablet Take 1 tablet (10 mg total) by mouth every 6 (six) hours as needed for nausea or vomiting. Qty: 30 tablet, Refills: 2   Associated Diagnoses: Cancer of pancreas, body ([place])      STOP taking these medications     capecitabine (XELODA) 500 MG tablet       lidocaine-prilocaine (EMLA) cream        No Known Allergies Follow-up Information    WEBER,SARAH, PA-C. Schedule an appointment as soon as possible for a visit in 1 week(s).   Specialty:  Physician Assistant Contact information: [place] [id number]        Stark Klein, MD. Schedule an appointment as soon as possible for a visit in 2 week(s).   Specialty:  General Surgery Contact information: [address] [place] [address] [phone]           The results of significant diagnostics from this hospitalization (including imaging, microbiology, ancillary and laboratory) are listed below for reference.    Significant Diagnostic Studies: Dg Chest 2 View  Result Date: 12/05/2015 CLINICAL DATA:  65 year old male with a history of hematemesis vs hemoptysis, h/o cancer, constipation EXAM: CHEST  2 VIEW COMPARISON:  CT 11/30/2015, 11/19/2015 FINDINGS: Cardiomediastinal silhouette within normal limits. No evidence of central vascular congestion. Lung volumes adequate, with coarsening of interstitial markings. No confluent airspace disease pneumothorax. No pleural effusion. Partially imaged surgical  changes of the cervical region. No displaced fracture. IMPRESSION: Chronic lung changes without evidence of superimposed acute cardiopulmonary disease. Signed, Dulcy Fanny. Earleen Newport, DO [facility] Electronically Signed   By: Corrie Mckusick D.O.   On: 12/05/2015 08:21   Dg Abd 1 View  Result Date: 12/05/2015 CLINICAL DATA:  65 year old male with a history of hemoptysis versus hematemesis EXAM: ABDOMEN - 1 VIEW COMPARISON:  CT 11/30/2015, 11/19/2015 FINDINGS: Gas within stomach, small bowel, colon.  No abnormal distention. No identified free air on this plain film series. Surgical changes of the upper abdomen, compatible with the patient's given imaging history. No displaced fracture. Degenerative  changes of the spine. Pelvic calcifications. IMPRESSION: Nonobstructive bowel gas pattern. No free air identified on this plain film series. Surgical changes of the upper abdomen, compatible with the patient's given imaging history. Signed, Dulcy Fanny. Earleen Newport, DO [facility] Electronically Signed   By: Corrie Mckusick D.O.   On: 12/05/2015 08:28   Nm Bone Scan Whole Body  Result Date: 11/22/2015 CLINICAL DATA:  WOV PROSTATE CA, RESEARCH STUDY EMBARK [id number] DR EDMUNDS TO READ 18 MCI TC-MDP RAC GHT EXAM: NUCLEAR MEDICINE WHOLE BODY BONE SCAN TECHNIQUE: Whole body anterior and posterior images were obtained approximately 3 hours after intravenous injection of radiopharmaceutical. RADIOPHARMACEUTICALS:  Eighteen mCi Technetium-65m MDP IV PCWG2 Measurements for Bone Metastasis: Baseline 12/09/2013 No evidence skeletal metastasis COMPARISON:  Bone scan 06/07/2015 FINDINGS: No focal uptake within the axillary or appendicular skeleton to suggest metastatic prostate cancer. Sacrum partially obscured by radiotracer in the bladder. Scoliosis spine. Degenerative uptake in the RIGHT MTP joint IMPRESSION: No evidence skeletal metastasis.  No change from prior. Electronically Signed   By: Suzy Bouchard M.D.   On: 11/22/2015 08:09   Ct Abdomen Pelvis W Contrast  Result Date: 11/30/2015 CLINICAL DATA:  Worsening right abdominal pain, nausea, completed radiation for pancreatic cancer 9/19 slid/17 EXAM: CT ABDOMEN AND PELVIS WITH CONTRAST TECHNIQUE: Multidetector CT imaging of the abdomen and pelvis was performed using the standard protocol following bolus administration of intravenous contrast. CONTRAST:  157mL ISOVUE-300 IOPAMIDOL (ISOVUE-300) INJECTION 61% COMPARISON:  11/19/2015 FINDINGS: Lower chest: Lung bases are unremarkable. Small hiatal hernia is noted. Hepatobiliary: No focal hepatic mass. Mild perihepatic ascites. No calcified gallstones are noted within  gallbladder. Pancreas: Again noted status post surgical resection of pancreatic body and tail. The previous pancreatic pseudocyst is not identified. At the site of the pancreatic cyst there is a elongated collection measures 6.2 cm length by 1.6 cm thickness with mild enhancing peripheral wall. There is small air and fluid within collection. This is highly suspicious for residual abscess or pseudocyst. There is a fistulous tract and large communication with medial wall of the stomach/gastric lumen please see axial image 30 measures at least 1 cm. The collection is in close proximity just anterior to the portal vein. The portal vein is about 3 mm posterior to the collection. Also the collection is close proximity with gastro hepatic artery. No evidence of active bleed or contrast extravasation. Again noted collateral vessels in anterior mesentery anterior to the stomach. There is stranding of the fat just anterior to SMA and inferior to portal vein suspicious for inflammatory changes. Postradiation mesenteritis cannot be excluded. Spleen: The patient is status post splenectomy. Adrenals/Urinary Tract: No adrenal gland mass. Kidneys are symmetrical in size and enhancement. No hydronephrosis or hydroureter. Stomach/Bowel: There is significant thickening of distal gastric wall. Findings highly suspicious for gastritis.  Postradiation gastritis cannot be excluded. No definite evidence of gastric outlet obstruction. No small bowel obstruction. Moderate stool and gas noted within stomach and cecum. The terminal ileum is unremarkable. Normal appendix partially visualized in axial image 54. Some colonic gas and stool noted in distal colon. No distal colonic obstruction. No evidence of acute colitis or diverticulitis. Moderate stool noted within transverse colon. Vascular/Lymphatic: No aortic aneurysm. Atherosclerotic calcifications of abdominal aorta and iliac arteries. The portal vein, superior mesenteric artery superior  mesenteric vein appear patent. No evidence of contrast extravasation or active bleed. Bilateral renal artery is patent. There are left mesenteric lymph nodes just lateral to SMA measuring 1.2 cm. This may be reactive in nature or due to metastatic disease. Please see axial image 35. Reproductive: The patient is status post prostatectomy. No pelvic mass is noted. Other: The urinary bladder is unremarkable. No evidence of pelvic ascites. No inguinal adenopathy. Musculoskeletal: No destructive bony lesions are noted. Sagittal images of the spine shows mild degenerative changes thoracolumbar spine. IMPRESSION: 1. Again noted status post surgical resection of pancreatic body and tail. The previous pancreatic pseudocyst is not identified. At the site of the pancreatic cyst there is a elongated collection measures 6.2 cm length by 1.6 cm thickness with mild enhancing peripheral wall. There is small air and fluid within collection. This is highly suspicious for residual abscess or pseudocyst. There is a fistulous tract and large communication with medial wall of the stomach/gastric lumen please see axial image 30 measures at least 1 cm. The collection is in close proximity just anterior to the portal vein. The portal vein is about 3 mm posterior to the collection. Also the collection is close proximity with gastro hepatic artery. No evidence of active bleed or contrast extravasation. Again noted collateral vessels in anterior mesentery anterior to the stomach. There is stranding of the fat just anterior to SMA and inferior to portal vein suspicious for inflammatory changes. Postradiation mesenteritis cannot be excluded. 2. No small bowel obstruction. 3. There are lymph nodes adjacent to SMA suspicious for metastatic disease or reactive adenopathy. 4. There is significant thickening of distal gastric wall. Postradiation gastritis cannot be excluded. 5. No small bowel or colonic obstruction. Moderate stool noted in proximal  colon. Normal appendix. No pericecal inflammation. 6. Status post prostatectomy. 7. Mild degenerative changes thoracolumbar spine. These results were called by telephone at the time of interpretation on 11/30/2015 at 12:41 pm to Dr. Addison Lank , who verbally acknowledged these results. Electronically Signed   By: Lahoma Crocker M.D.   On: 11/30/2015 12:41   Ct Angio Abd/pel W And/or Wo Contrast  Result Date: 12/05/2015 CLINICAL DATA:  65 year old male with a history of new hematemesis. Prior pancreatic pseudocyst with spontaneous decompression. Patient has a history of pancreatic cancer, prior laparoscopy, open distal subtotal pancreatectomy and splenectomy 08/27/2015. EXAM: CT ABDOMEN AND PELVIS WITH CONTRAST TECHNIQUE: Multidetector CT imaging of the abdomen and pelvis was performed using the standard protocol following bolus administration of intravenous contrast. CONTRAST:  100 cc IV Isovue 370 COMPARISON:  None. FINDINGS: Lower chest: Unremarkable. Hepatobiliary: No focal liver abnormality is seen. No gallstones, gallbladder wall thickening, or biliary dilatation. Pancreas: Patient is status post distal pancreatectomy for treatment of known pancreatic carcinoma. Inflammatory changes replace the usual fat and mesenteric adjacent to the operative bed. The previous identified fistulous connection persists, with intermediate density material extending through wall of posterior stomach defect. There is gas tracking behind posterior wall of the stomach. Head of the pancreas  enhances within normal limits. Spleen: Splenectomy Adrenals/Urinary Tract: Bilateral adrenal glands unremarkable. Right kidney unremarkable.  No hydronephrosis or nephrolithiasis. Left kidney unremarkable without hydronephrosis or nephrolithiasis. Course the bilateral ureters unremarkable. Unremarkable appearance of the urinary bladder. Stomach/Bowel: Persisting inflammatory changes posterior to the stomach, with defect in the posterior stomach  wall measuring 3.3 cm. Soft tissue/ fluid/succus fills the stomach wall defect, and tracks into the retroperitoneal space Similar appearance of decompressed pseudocyst in the surgical bed. Inflammatory changes extend along the distal stomach and the stomach antrum to the pylorus. No distention of small bowel or colon. No transition point. Normal appendix. Vascular/Lymphatic: Surgical changes of splenic artery ligation. Anomalous origin of left gastric artery, at the base of the celiac artery origin or potentially directly from the aorta. Small hyperenhancing/ hyperdense focus measuring 1.5 cm adjacent to the left gastric artery (image 36 of series 4). This is un chain on arterial and venous phase. Surgical clips adjacent to the wall of the decompressed pseudo sister unchanged. Edema throughout the upper abdomen mesenteric. Multiple lymph nodes of small bowel mesenteric. Scattered calcifications abdominal aorta. No dissection or aneurysm. Bilateral iliac system patent. Significant atherosclerotic changes of the proximal left SFA, incompletely imaged. Reproductive: Surgical changes of prostatectomy. Other: Surgical changes of inguinal hernia repair. Musculoskeletal: No displaced fracture. Degenerative changes of the spine. IMPRESSION: No CT angiogram evidence of active extravasation. No pseudoaneurysm identified. Re- demonstration of pancreatic pseudocyst decompression through a large fistulous connection to the posterior stomach wall. Significant inflammatory changes are present surrounding the pseudo cyst, posterior stomach wall, and the surgical bed of the prior pancreatectomy. Phlegmonous material fills the residual pseudocyst, extending through the posterior stomach wall, potentially necrotic debris, enteric contents, or potentially blood products. Surgical changes of prior distal pancreatectomy and splenectomy. Reactive changes throughout the abdomen of multiple lymph nodes and edema/inflammation of the fat and  mesenteric. Aortic atherosclerosis. These results were called by telephone at the time of interpretation on 12/05/2015 at 9:49 am to Dr. Theotis Burrow , who verbally acknowledged these results. Signed, Dulcy Fanny. Earleen Newport, DO [facility] Electronically Signed   By: Corrie Mckusick D.O.   On: 12/05/2015 09:52   Labs: Basic Metabolic Panel:  Recent Labs Lab 12/05/15 0655 12/06/15 0457  NA 131* 135  K 4.1 3.5  CL 99* 105  CO2 25 24  GLUCOSE 169* 90  BUN 16 17  CREATININE 0.51* 0.47*  CALCIUM 8.5* 8.1*   Liver Function Tests:  Recent Labs Lab 12/05/15 0655 12/06/15 0457  AST 20 16  ALT 12* 10*  ALKPHOS 69 57  BILITOT 0.8 0.5  PROT 6.2* 5.6*  ALBUMIN 3.1* 2.6*   CBC:  Recent Labs Lab 12/05/15 0655  12/06/15 0457 12/06/15 1338 12/06/15 1819 12/07/15 0559 12/08/15 0546  WBC 8.3  --  7.1  --   --   --  4.3  HGB 9.9*  < > 7.7* 8.4* 7.8* 9.0* 7.4*  HCT 28.2*  < > 22.8* 24.1* 22.7* 26.8* 21.2*  MCV 90.7  --  91.9  --   --   --  87.6  PLT 425*  --  384  --   --   --  401*  < > = values in this interval not displayed.  CBG:  Recent Labs Lab 12/07/15 0805 12/07/15 0828 12/07/15 1220 12/07/15 1706  GLUCAP 58* 54* 178* 132*    Signed:  Barton Dubois MD.  [facility] 12/08/2015, 11:25 AM

## 2015-12-08 NOTE — Progress Notes (Signed)
[facility] Progress Note     Subjective: Abd pain better, no nausea.  Tolerating full liquids    Objective: Vital signs in last 24 hours: Temp:  [98.1 F (36.7 C)-98.9 F (37.2 C)] 98.8 F (37.1 C) (10/07 0438) Pulse Rate:  [65-72] 71 (10/07 0438) Resp:  [18] 18 (10/07 0438) BP: (105-115)/(49-61) 109/61 (10/07 0438) SpO2:  [100 %] 100 % (10/07 0438) Last BM Date: 12/07/15  Intake/Output from previous day: 10/06 0701 - 10/07 0700 In: 1265.4 [I.V.:1265.4] Out: -  Intake/Output this shift: No intake/output data recorded.  PE: Gen:  Alert, NAD, pleasant HEENT: mucuos membranes moist and pink, no lesions or palatal petechiae  Pulm:  CTA, no W/R/R Abd: Soft, NT/ND, +BS, previous laparotomy scar   Lab Results:   Recent Labs  12/06/15 0457  12/07/15 0559 12/08/15 0546  WBC 7.1  --   --  4.3  HGB 7.7*  < > 9.0* 7.4*  HCT 22.8*  < > 26.8* 21.2*  PLT 384  --   --  401*  < > = values in this interval not displayed. BMET  Recent Labs  12/06/15 0457  NA 135  K 3.5  CL 105  CO2 24  GLUCOSE 90  BUN 17  CREATININE 0.47*  CALCIUM 8.1*   CMP     Component Value Date/Time   NA 135 12/06/2015 0457   NA 140 10/09/2015 1001   K 3.5 12/06/2015 0457   K 3.7 10/09/2015 1001   CL 105 12/06/2015 0457   CO2 24 12/06/2015 0457   CO2 25 10/09/2015 1001   GLUCOSE 90 12/06/2015 0457   GLUCOSE 167 (H) 10/09/2015 1001   BUN 17 12/06/2015 0457   BUN 11.0 10/09/2015 1001   CREATININE 0.47 (L) 12/06/2015 0457   CREATININE 0.7 10/09/2015 1001   CALCIUM 8.1 (L) 12/06/2015 0457   CALCIUM 9.6 10/09/2015 1001   PROT 5.6 (L) 12/06/2015 0457   PROT 7.1 10/09/2015 1001   ALBUMIN 2.6 (L) 12/06/2015 0457   ALBUMIN 3.7 10/09/2015 1001   AST 16 12/06/2015 0457   AST 23 10/09/2015 1001   ALT 10 (L) 12/06/2015 0457   ALT 17 10/09/2015 1001   ALKPHOS 57 12/06/2015 0457   ALKPHOS 94 10/09/2015 1001   BILITOT 0.5 12/06/2015 0457   BILITOT 0.36 10/09/2015 1001   GFRNONAA >60 12/06/2015 0457   GFRNONAA >89 06/05/2015 0911   GFRAA >60 12/06/2015 0457   GFRAA >89 06/05/2015 0911   Studies/Results: No results found. Assessment/Plan Pancreatic pseudocyst with erosion into the stomach Hematemesis  - S/p distal pancreatectomy and splenectomy 08/27/15 (Dr, [name]) for pancreatic adenocarcinoma; Xeloda and radiation 10/2015 - anemia - Hgb rising - cont fulls  FEN: FLD x 2 weeks VTE: SCD's Code status: DNR  Plan: Seems to be stable despite Hgb being down some today.  Will leave d/c decision up to his primary MD    LOS: 3 days    Rosario Adie, MD  [facility]

## 2015-12-10 LAB — TYPE AND SCREEN
ABO/RH(D): B POS
Antibody Screen: NEGATIVE
UNIT DIVISION: 0
UNIT DIVISION: 0

## 2015-12-13 ENCOUNTER — Inpatient Hospital Stay: Payer: Managed Care, Other (non HMO)

## 2015-12-21 ENCOUNTER — Encounter: Payer: Self-pay | Admitting: Physician Assistant

## 2015-12-21 DIAGNOSIS — K316 Fistula of stomach and duodenum: Secondary | ICD-10-CM | POA: Insufficient documentation

## 2015-12-25 ENCOUNTER — Encounter: Payer: Self-pay | Admitting: Physician Assistant

## 2015-12-25 ENCOUNTER — Ambulatory Visit (INDEPENDENT_AMBULATORY_CARE_PROVIDER_SITE_OTHER): Payer: Managed Care, Other (non HMO) | Admitting: Physician Assistant

## 2015-12-25 VITALS — BP 110/60 | HR 88 | Temp 98.2°F | Resp 17 | Ht 68.0 in | Wt 128.0 lb

## 2015-12-25 DIAGNOSIS — I10 Essential (primary) hypertension: Secondary | ICD-10-CM

## 2015-12-25 DIAGNOSIS — E43 Unspecified severe protein-calorie malnutrition: Secondary | ICD-10-CM

## 2015-12-25 DIAGNOSIS — C251 Malignant neoplasm of body of pancreas: Secondary | ICD-10-CM | POA: Diagnosis not present

## 2015-12-25 DIAGNOSIS — K316 Fistula of stomach and duodenum: Secondary | ICD-10-CM | POA: Diagnosis not present

## 2015-12-25 DIAGNOSIS — D649 Anemia, unspecified: Secondary | ICD-10-CM | POA: Diagnosis not present

## 2015-12-25 LAB — CBC WITH DIFFERENTIAL/PLATELET
BASOS PCT: 1 %
Basophils Absolute: 33 cells/uL (ref 0–200)
EOS ABS: 33 {cells}/uL (ref 15–500)
Eosinophils Relative: 1 %
HEMATOCRIT: 29.4 % — AB (ref 38.5–50.0)
HEMOGLOBIN: 9.7 g/dL — AB (ref 13.2–17.1)
LYMPHS ABS: 1386 {cells}/uL (ref 850–3900)
Lymphocytes Relative: 42 %
MCH: 29.8 pg (ref 27.0–33.0)
MCHC: 33 g/dL (ref 32.0–36.0)
MCV: 90.2 fL (ref 80.0–100.0)
MONO ABS: 462 {cells}/uL (ref 200–950)
MPV: 8.4 fL (ref 7.5–12.5)
Monocytes Relative: 14 %
NEUTROS PCT: 42 %
Neutro Abs: 1386 cells/uL — ABNORMAL LOW (ref 1500–7800)
Platelets: 383 10*3/uL (ref 140–400)
RBC: 3.26 MIL/uL — AB (ref 4.20–5.80)
RDW: 15.5 % — ABNORMAL HIGH (ref 11.0–15.0)
WBC: 3.3 10*3/uL — AB (ref 3.8–10.8)

## 2015-12-25 LAB — COMPLETE METABOLIC PANEL WITH GFR
ALBUMIN: 3.4 g/dL — AB (ref 3.6–5.1)
ALK PHOS: 104 U/L (ref 40–115)
ALT: 12 U/L (ref 9–46)
AST: 24 U/L (ref 10–35)
BILIRUBIN TOTAL: 0.3 mg/dL (ref 0.2–1.2)
BUN: 13 mg/dL (ref 7–25)
CALCIUM: 9.1 mg/dL (ref 8.6–10.3)
CO2: 26 mmol/L (ref 20–31)
CREATININE: 0.66 mg/dL — AB (ref 0.70–1.25)
Chloride: 103 mmol/L (ref 98–110)
GFR, Est Non African American: >89 mL/min (ref >=60)
Glucose, Bld: 101 mg/dL — ABNORMAL HIGH (ref 65–99)
Potassium: 3.8 mmol/L (ref 3.5–5.3)
Sodium: 138 mmol/L (ref 135–146)
Total Protein: 5.9 g/dL — ABNORMAL LOW (ref 6.1–8.1)

## 2015-12-25 NOTE — Patient Instructions (Addendum)
F/u with surgeon - I would call them to see about your eating  Check you BP at home I want it to stay below 130/85 if it gets higher than that we may have to start BP medication again   IF you received an x-ray today, you will receive an invoice from [facility] Rad[facility]. Please contact [facility]iology at [phone] with questions or concerns regarding your invoice.   IF you received labwork today, you will receive an invoice from [employer]. Please contact [facility] at [phone] with questions or concerns regarding your invoice.   Our billing staff will not be able to assist you with questions regarding bills from these companies.  You will be contacted with the lab results as soon as they are available. The fastest way to get your results is to activate your My Chart account. Instructions are located on the last page of this paperwork. If you have not heard from [place] regarding the results in 2 weeks, please contact this office.

## 2015-12-25 NOTE — Progress Notes (Signed)
Perry Robinson  MRN: [id number] DOB: 07/17/1950  Subjective:  Pt presents to clinic for hospital f/u. Pt has had a hard year.  He had neck surgery in Feb and was recovery from that when in 06/2015 a mass was seen in his pancrease on a CT done from his prostate cancer surveillance and he was diagnosed with Pancreatic cancer - the distal pancreas was removed in June. He underwent radiation therapy from 8/8-9/5.  On 10/4 he as admitted into the hospital due hematemesis with decrease hemoglobin during his hospital stay. He has his future appt set up  appt with surgeon [name]) 01/14/2016 8/8-9/5 radiation for pancreatic cancer Oncologist 01/31/2016  He has been on a liquid diet since his hospital discharge and was instructed to do this until he sees the surgeon.  Trying to quit smoking -  No ETOH in 2.5 months  Better mentally than he has been in months.  He now lives day to day he know 'if he opens his eyes in the morning God wants him to be here."  He has enjoyed not working and is really enjoying doing what he wants when he wants.   Review of Systems  Patient Active Problem List   Diagnosis Date Noted  . Acquired gastric fistula 12/21/2015  . Melena   . Protein-calorie malnutrition, severe ([place])   . Acute blood loss anemia   . Hematemesis 12/05/2015  . Family history of prostate cancer 09/12/2015  . Carcinoma of body of pancreas ([place]) 08/27/2015  . Cancer of pancreas, body ([place]) 06/28/2015  . History of cervical discectomy 05/29/2015  . Diverticulosis of colon 06/09/2013  . Back pain with left-sided radiculopathy 04/13/2013  . Essential hypertension   . Prostate cancer [facility])     Current Outpatient Prescriptions on File Prior to Visit  Medication Sig Dispense Refill  . famotidine (PEPCID) 20 MG tablet Take 1 tablet (20 mg total) by mouth at bedtime. 30 tablet 1  . feeding supplement, ENSURE ENLIVE, (ENSURE ENLIVE) LIQD Take 237 mLs by mouth 2 (two) times daily between  meals. 237 mL 12  . Iron Polysacch Cmplx-B12-FA 150-0.025-1 MG CAPS Take 1 capsule by mouth daily. 30 each 2  . pantoprazole (PROTONIX) 40 MG tablet Take 1 tablet (40 mg total) by mouth 2 (two) times daily. 60 tablet 1   No current facility-administered medications on file prior to visit.     No Known Allergies  Pt patients past, family and social history were reviewed and updated.  Objective:  BP 110/60 (BP Location: Right Arm, Patient Position: Sitting, Cuff Size: Normal)   Pulse 88   Temp 98.2 F (36.8 C) (Oral)   Resp 17   Ht 5\' 8"  (1.727 m)   Wt 128 lb (58.1 kg)   SpO2 100%   BMI 19.46 kg/m   Physical Exam  Constitutional: He is oriented to person, place, and time and well-developed, well-nourished, and in no distress.  HENT:  Head: Normocephalic and atraumatic.  Right Ear: External ear normal.  Left Ear: External ear normal.  Eyes: Conjunctivae are normal.  Neck: Normal range of motion.  Cardiovascular: Normal rate, regular rhythm and normal heart sounds.   No murmur heard. Pulmonary/Chest: Effort normal and breath sounds normal. He has no wheezes.  Neurological: He is alert and oriented to person, place, and time. Gait normal.  Skin: Skin is warm and dry.  Psychiatric: Mood, memory, affect and judgment normal.    Assessment and Plan :  Low hemoglobin - Plan:  CBC with Differential/Platelet - check labs as they have not been checked since his transfusion prior to his hospital discharge.  Protein-calorie malnutrition, severe ([place]) - Plan: COMPLETE METABOLIC PANEL WITH GFR - check labs  Carcinoma of body of pancreas ([place])  Acquired gastric fistula  Essential hypertension - BP is currently well controlled off medications - he will continue to not use medication - he will check his BP at home. He has lost weight over the last year with his health problems. We will continue to monitor.  Windell Hummingbird PA-C  [facility] 12/25/2015 12:21 PM

## 2015-12-26 ENCOUNTER — Telehealth: Payer: Self-pay | Admitting: *Deleted

## 2015-12-26 NOTE — Telephone Encounter (Signed)
  Oncology Nurse Navigator Documentation  Navigator Location: [facility] (12/26/15 1554)   )Navigator Encounter Type: Telephone (12/26/15 1554) Telephone: Outgoing Call;Patient Update (12/26/15 1554)   Called Perry Robinson to offer a follow up appointment to review everything that has happened to him and for [name] recommendations for follow up, as he has declined further treatment. He tells RN he is tired of doctors and hospitals and seeing [name] and the radiation MD is enough. He is concerned about his weight loss and weakness and says "I'll take my chances". Informed him that we are here for him if he ever needs [place].

## 2015-12-27 ENCOUNTER — Encounter: Payer: Self-pay | Admitting: Genetic Counselor

## 2015-12-27 DIAGNOSIS — Z1379 Encounter for other screening for genetic and chromosomal anomalies: Secondary | ICD-10-CM | POA: Insufficient documentation

## 2015-12-28 ENCOUNTER — Telehealth: Payer: Self-pay | Admitting: *Deleted

## 2015-12-28 NOTE — Telephone Encounter (Signed)
Call from Almyra Free, RN Case manager with [facility]: Courtesy call to leave contact information in case we need case manager assistance with this pt.  Phone [phone].

## 2015-12-31 ENCOUNTER — Telehealth: Payer: Self-pay | Admitting: *Deleted

## 2015-12-31 ENCOUNTER — Ambulatory Visit
Admission: RE | Admit: 2015-12-31 | Discharge: 2015-12-31 | Disposition: A | Discharge status: Home / Self Care | Payer: Managed Care, Other (non HMO) | Source: Ambulatory Visit | Attending: Radiation Oncology | Admitting: Radiation Oncology

## 2015-12-31 ENCOUNTER — Encounter: Payer: Self-pay | Admitting: Radiation Oncology

## 2015-12-31 VITALS — BP 142/71 | Temp 98.3°F | Ht 68.0 in | Wt 129.4 lb

## 2015-12-31 DIAGNOSIS — C251 Malignant neoplasm of body of pancreas: Secondary | ICD-10-CM | POA: Insufficient documentation

## 2015-12-31 DIAGNOSIS — Z79899 Other long term (current) drug therapy: Secondary | ICD-10-CM | POA: Insufficient documentation

## 2015-12-31 DIAGNOSIS — K861 Other chronic pancreatitis: Secondary | ICD-10-CM | POA: Insufficient documentation

## 2015-12-31 NOTE — Telephone Encounter (Signed)
CALLED PATIENT TO INFORM OF FU WITH ALISON PERKINS ON 07-01-15 @ 10:30 AM, SPOKE WITH PATIENT AND HE IS AWARE OF THIS APPT.

## 2015-12-31 NOTE — Progress Notes (Signed)
Radiation Oncology         [phone] ________________________________  Name: Perry Robinson MRN: [id number]  Date: 12/31/2015  DOB: 12-16-1950  Post Treatment Note  CC: Perry Edge, MD  Diagnosis:  65 yo gentleman with stage IIB, T1, N1 adenocarcinoma of the body of the pancreas     Interval Since Last Radiation: 6 weeks  10/09/15-11/20/15:  50.4 Gy in 28 fractions to the peri-pancreatic area  Narrative:  Perry Robinson is a 65 y.o. gentleman who was diagnosed with stage IIB, T1N1 adenocarcinoma of the body of the pancrease. He underwent subtotal pancreatectomy and splenectomy on 08/27/2015. Pathology revealed invasive moderately differentiated adenocarcinoma measuring 1.8 cm. Associated high-grade pancreatic epithelial neoplasia was noted with a background chronic pancreatitis. Margins were negative, and 3 of 11 lymph nodes were positive for metastatic adenocarcinoma. He received adjuvant Xeloda during radiotherapy, but has declined systemic chemotherapy. He has no plans at this time to follow up with medical oncology at this time. He will however follow up with [name] and myself. He sees [name] on 01/14/16. Of note he was seen on 11/30/2015 for a work in visit due to acute onset of abdominal pain, in the emergency room and a CT of the abdomen and pelvis was performed revealing a pancreatic cyst with fluid collection measuring 6.2 cm x 1.6 cm with mild enhancement of the peripheral wall there is small air and fluid within this collection. There is also fistulous tract in largest medication and medial wall of the stomach and gastric lumen. The collection is close proximity to the portal vein anteriorly, no evidence ofnew disease was identified, there was a left mesenteric lymph node measuring 1.2 cm lateral to the SMA, this will be followed as it was possible that this could be due to reactive changes. He was also admitted on 12/05/2015 due to hematemesis, he was  followed expectantly, given one unit of packed red blood cells, and has been on a liquid diet since this time.                        On review of systems, the patient reports that since his last visit he is doing extremely well. He denies any difficulty with abdominal pain, nausea or vomiting. He denies any hematemesis since his admission. He denies any difficulty with bowel or bladder function. He denies any fevers, chills, chest pain or shortness of breath. A complete review of systems is obtained and is otherwise negative.  ALLERGIES:  has No Known Allergies.  Meds: Current Outpatient Prescriptions  Medication Sig Dispense Refill  . famotidine (PEPCID) 20 MG tablet Take 1 tablet (20 mg total) by mouth at bedtime. 30 tablet 1  . feeding supplement, ENSURE ENLIVE, (ENSURE ENLIVE) LIQD Take 237 mLs by mouth 2 (two) times daily between meals. 237 mL 12  . Iron Polysacch Cmplx-B12-FA 150-0.025-1 MG CAPS Take 1 capsule by mouth daily. 30 each 2  . pantoprazole (PROTONIX) 40 MG tablet Take 1 tablet (40 mg total) by mouth 2 (two) times daily. 60 tablet 1   No current facility-administered medications for this encounter.     Physical Findings:  height is 5\' 8"  (1.727 m) and weight is 129 lb 6.4 oz (58.7 kg). His oral temperature is 98.3 F (36.8 C). His blood pressure is 142/71 (abnormal).  In general this is a well appearing African-American male in no acute distress. He's alert and oriented x4 and appropriate throughout the  examination. Cardiopulmonary assessment is negative for acute distress and he exhibits normal effort.    Lab Findings: Lab Results  Component Value Date   WBC 3.3 (L) 12/25/2015   HGB 9.7 (L) 12/25/2015   HCT 29.4 (L) 12/25/2015   MCV 90.2 12/25/2015   PLT 383 12/25/2015     Radiographic Findings: Dg Chest 2 View  Result Date: 12/05/2015 CLINICAL DATA:  65 year old male with a history of hematemesis vs hemoptysis, h/o cancer, constipation EXAM: CHEST  2 VIEW  COMPARISON:  CT 11/30/2015, 11/19/2015 FINDINGS: Cardiomediastinal silhouette within normal limits. No evidence of central vascular congestion. Lung volumes adequate, with coarsening of interstitial markings. No confluent airspace disease pneumothorax. No pleural effusion. Partially imaged surgical changes of the cervical region. No displaced fracture. IMPRESSION: Chronic lung changes without evidence of superimposed acute cardiopulmonary disease. Signed, Dulcy Fanny. Earleen Newport, DO [facility] Radiology Electronically Signed   By: Corrie Mckusick D.O.   On: 12/05/2015 08:21   Dg Abd 1 View  Result Date: 12/05/2015 CLINICAL DATA:  65 year old male with a history of hemoptysis versus hematemesis EXAM: ABDOMEN - 1 VIEW COMPARISON:  CT 11/30/2015, 11/19/2015 FINDINGS: Gas within stomach, small bowel, colon.  No abnormal distention. No identified free air on this plain film series. Surgical changes of the upper abdomen, compatible with the patient's given imaging history. No displaced fracture. Degenerative changes of the spine. Pelvic calcifications. IMPRESSION: Nonobstructive bowel gas pattern. No free air identified on this plain film series. Surgical changes of the upper abdomen, compatible with the patient's given imaging history. Signed, Dulcy Fanny. Earleen Newport, DO [facility] Radiology Electronically Signed   By: Corrie Mckusick D.O.   On: 12/05/2015 08:28   Ct Angio Abd/pel W And/or Wo Contrast  Result Date: 12/05/2015 CLINICAL DATA:  65 year old male with a history of new hematemesis. Prior pancreatic pseudocyst with spontaneous decompression. Patient has a history of pancreatic cancer, prior laparoscopy, open distal subtotal pancreatectomy and splenectomy 08/27/2015. EXAM: CT ABDOMEN AND PELVIS WITH CONTRAST TECHNIQUE: Multidetector CT imaging of the abdomen and pelvis was performed using the standard protocol following bolus  administration of intravenous contrast. CONTRAST:  100 cc IV Isovue 370 COMPARISON:  None. FINDINGS: Lower chest: Unremarkable. Hepatobiliary: No focal liver abnormality is seen. No gallstones, gallbladder wall thickening, or biliary dilatation. Pancreas: Patient is status post distal pancreatectomy for treatment of known pancreatic carcinoma. Inflammatory changes replace the usual fat and mesenteric adjacent to the operative bed. The previous identified fistulous connection persists, with intermediate density material extending through wall of posterior stomach defect. There is gas tracking behind posterior wall of the stomach. Head of the pancreas enhances within normal limits. Spleen: Splenectomy Adrenals/Urinary Tract: Bilateral adrenal glands unremarkable. Right kidney unremarkable.  No hydronephrosis or nephrolithiasis. Left kidney unremarkable without hydronephrosis or nephrolithiasis. Course the bilateral ureters unremarkable. Unremarkable appearance of the urinary bladder. Stomach/Bowel: Persisting inflammatory changes posterior to the stomach, with defect in the posterior stomach wall measuring 3.3 cm. Soft tissue/ fluid/succus fills the stomach wall defect, and tracks into the retroperitoneal space Similar appearance of decompressed pseudocyst in the surgical bed. Inflammatory changes extend along the distal stomach and the stomach antrum to the pylorus. No distention of small bowel or colon. No transition point. Normal appendix. Vascular/Lymphatic: Surgical changes of splenic artery ligation. Anomalous origin of left gastric artery, at the base of the celiac artery origin or potentially directly from the aorta. Small hyperenhancing/ hyperdense focus measuring 1.5 cm adjacent to the left  gastric artery (image 36 of series 4). This is un chain on arterial and venous phase. Surgical clips adjacent to the wall of the decompressed pseudo sister unchanged. Edema throughout the upper abdomen mesenteric.  Multiple lymph nodes of small bowel mesenteric. Scattered calcifications abdominal aorta. No dissection or aneurysm. Bilateral iliac system patent. Significant atherosclerotic changes of the proximal left SFA, incompletely imaged. Reproductive: Surgical changes of prostatectomy. Other: Surgical changes of inguinal hernia repair. Musculoskeletal: No displaced fracture. Degenerative changes of the spine. IMPRESSION: No CT angiogram evidence of active extravasation. No pseudoaneurysm identified. Re- demonstration of pancreatic pseudocyst decompression through a large fistulous connection to the posterior stomach wall. Significant inflammatory changes are present surrounding the pseudo cyst, posterior stomach wall, and the surgical bed of the prior pancreatectomy. Phlegmonous material fills the residual pseudocyst, extending through the posterior stomach wall, potentially necrotic debris, enteric contents, or potentially blood products. Surgical changes of prior distal pancreatectomy and splenectomy. Reactive changes throughout the abdomen of multiple lymph nodes and edema/inflammation of the fat and mesenteric. Aortic atherosclerosis. These results were called by telephone at the time of interpretation on 12/05/2015 at 9:49 am to Dr. Theotis Burrow , who verbally acknowledged these results. Signed, Dulcy Fanny. Earleen Newport, DO [facility] Radiology Electronically Signed   By: Corrie Mckusick D.O.   On: 12/05/2015 09:52    Impression/Plan: 1. Stage IIB, T1, N1 adenocarcinoma of the pancreas. The patient appears to be doing much better since his last visit, he will continue to follow up with [name], and I have offered to schedule his next appointment for 6 months time with me. If he develops a need for medical oncology in the near future, he will return to Dr. Benay Spice, however at this point he is not interested in pursuing this. 2. Peripancreatic fluid collection. As above  the patient will continue to follow up with [name]. He is asymptomatic from this at this time. We will continue to follow this expectantly.    Carola Rhine, PAC

## 2015-12-31 NOTE — Addendum Note (Signed)
Encounter addended by: Reshma Hoey M Alva Broxson, RN on: 12/31/2015 12:23 PM<BR>    Actions taken: Charge Capture section accepted

## 2015-12-31 NOTE — Progress Notes (Signed)
Perry Robinson returns for FU S/P Radiation Therapy for Pancreatic Cancer which completed on 11/06/15. Recent history of admission on 12/05/15 due to hematemesis with decreased hemoglobin of 9.7.  Transfusion required. He is scheduled to see [name] on 01/14/16.  Continues on Liquid diet.  Denies any pain at this time. Denies any issues with bowel issues at this time.  BP (!) 142/71 (BP Location: Right Arm, Patient Position: Sitting, Cuff Size: Normal)   Temp 98.3 F (36.8 C) (Oral)   Ht 5\' 8"  (1.727 m)   Wt 129 lb 6.4 oz (58.7 kg)   BMI 19.68 kg/m    Wt Readings from Last 3 Encounters:  12/31/15 129 lb 6.4 oz (58.7 kg)  12/25/15 128 lb (58.1 kg)  12/05/15 138 lb (62.6 kg)

## 2016-02-26 ENCOUNTER — Ambulatory Visit (INDEPENDENT_AMBULATORY_CARE_PROVIDER_SITE_OTHER): Payer: Medicare Other | Admitting: Physician Assistant

## 2016-02-26 VITALS — BP 125/67 | HR 76 | Resp 16

## 2016-02-26 DIAGNOSIS — S61012A Laceration without foreign body of left thumb without damage to nail, initial encounter: Secondary | ICD-10-CM

## 2016-02-26 DIAGNOSIS — S61211A Laceration without foreign body of left index finger without damage to nail, initial encounter: Secondary | ICD-10-CM

## 2016-02-26 NOTE — Progress Notes (Signed)
   02/26/2016 3:48 PM   DOB: 26-Mar-1950 / MRN: [id number]  SUBJECTIVE:  Perry Robinson is a 65 y.o. male presenting for hand injury sustained on a table saw today in which he cut the palmar surface of his distal left thumb and lateral aspected of the pointer finger just distal to the PIP.  Denies loss of strength and change in function of the hand.   He has No Known Allergies.    Review of Systems  Constitutional: Negative for fever.  Musculoskeletal: Negative for joint pain.  Skin: Positive for rash.    The problem list and medications were reviewed and updated by myself where necessary and exist elsewhere in the encounter.   OBJECTIVE:  BP 125/67   Pulse 76   Resp 16   Physical Exam  Musculoskeletal: Normal range of motion.       Hands: Neurological: He has normal strength.   Risk and benefits discussed and verbal consent obtained. Anesthetic allergies reviewed. Patient anesthetized using 1:1 mix of 2% lidocaine without epi and Marcaine. The wound was cleansed thoroughly with soap and water. Sterile prep and drape. Wound closed with 4-0 Prolene suture material. Hemostasis achieved. The thumb wound was approximated using as much as possible with HM sutures and extraneous fascia tucked into the wound. Mupirocin applied to the wound and bandage placed. The patient tolerated well. Wound instructions were provided and the patient is to return in 10 days for suture removal.   No results found for this or any previous visit (from the past 72 hour(s)).  No results found.  ASSESSMENT AND PLAN  Perry Robinson was seen today for laceration.  Diagnoses and all orders for this visit:  Laceration of left index finger without foreign body without damage to nail, initial encounter: Repaired. Follow up on Friday.  Mupirocin prescribed with strict BID cleansing and reapplication.   Laceration of left thumb without foreign body without damage to nail, initial encounter    The patient is  advised to call or return to clinic if he does not see an improvement in symptoms, or to seek the care of the closest emergency department if he worsens with the above plan.   Perry Robinson, MHS, PA-C [facility] 02/26/2016 3:48 PM

## 2016-02-27 ENCOUNTER — Other Ambulatory Visit: Payer: Self-pay | Admitting: Urology

## 2016-02-27 DIAGNOSIS — C61 Malignant neoplasm of prostate: Secondary | ICD-10-CM

## 2016-02-27 MED ORDER — MUPIROCIN 2 % EX OINT: 1.0000 "application " | TOPICAL_OINTMENT | Freq: Two times a day (BID) | CUTANEOUS | 0 refills | Status: DC

## 2016-02-27 NOTE — Addendum Note (Signed)
Addended by: Virgia Land on: 02/27/2016 09:55 AM   Modules accepted: Orders

## 2016-02-29 ENCOUNTER — Ambulatory Visit (INDEPENDENT_AMBULATORY_CARE_PROVIDER_SITE_OTHER): Payer: Medicare Other | Admitting: Family Medicine

## 2016-02-29 VITALS — BP 122/70 | HR 80 | Ht 68.0 in | Wt 136.0 lb

## 2016-02-29 DIAGNOSIS — S61211D Laceration without foreign body of left index finger without damage to nail, subsequent encounter: Secondary | ICD-10-CM

## 2016-02-29 DIAGNOSIS — S61012D Laceration without foreign body of left thumb without damage to nail, subsequent encounter: Secondary | ICD-10-CM

## 2016-02-29 NOTE — Progress Notes (Signed)
By signing my name below, I, Perry Robinson, attest that this documentation has been prepared under the direction and in the presence of Perry Cheadle, MD.  Electronically Signed: Verlee Robinson, Medical Scribe. 02/29/16. 9:23 AM.  Subjective:    Patient ID: Perry Robinson, male    DOB: 24-Oct-1950, 65 y.o.   MRN: [id number]  HPI Chief Complaint  Patient presents with  . Suture / Staple Removal    HPI Comments: DELONTAE Robinson is a 65 y.o. male who presents to the [facility] complaining of suture removal. Pt was seen 3 days prior for the injury on the palmer surface of his distal left thumb and pointer finger from a table saw. He was instructed to return in 10 days for suture removal which would be 1 week from today.  Pt thought he was supposed to come in today for his suture removal. He has been keeping his hands clean and has been compliant with his after visit instructions from 02/26/16. He reports some tenderness from the site. Denies drainage from site.  Patient Active Problem List   Diagnosis Date Noted  . Genetic testing 12/27/2015  . Acquired gastric fistula 12/21/2015  . Melena   . Protein-calorie malnutrition, severe ([place])   . Acute blood loss anemia   . Hematemesis 12/05/2015  . Family history of prostate cancer 09/12/2015  . Carcinoma of body of pancreas ([place]) 08/27/2015  . Cancer of pancreas, body ([place]) 06/28/2015  . History of cervical discectomy 05/29/2015  . Diverticulosis of colon 06/09/2013  . Back pain with left-sided radiculopathy 04/13/2013  . Essential hypertension   . Prostate cancer [facility])    Past Medical History:  Diagnosis Date  . Cancer of pancreas, body ([place]) 08/2015  . Cataract   . Diverticulosis   . GERD (gastroesophageal reflux disease)   . Hypertension   . Inguinal hernia   . Prostate cancer ([place]) 07/11/2004   Past Surgical History:  Procedure Laterality Date  . CATARACT EXTRACTION Bilateral 2011  . EUS N/A 06/28/2015    Procedure: UPPER ENDOSCOPIC ULTRASOUND (EUS) LINEAR;  Surgeon: [name], MD;  Location: [facility];  Service: Endoscopy;  Laterality: N/A;  spoke with patient about new check in/procedure times JB  . EYE SURGERY    . FINE NEEDLE ASPIRATION  06/28/2015   Procedure: FINE NEEDLE ASPIRATION;  Surgeon: [name], MD;  Location: [facility];  Service: Endoscopy;;  . INGUINAL HERNIA REPAIR Right 08/27/2015   Procedure: OPEN RIGHT INGUINAL HERNIA ;  Surgeon: Ralene Ok, MD;  Location: [place];  Service: General;  Laterality: Right;  . INSERTION OF MESH Right 08/27/2015   Procedure: INSERTION OF MESH;  Surgeon: Ralene Ok, MD;  Location: [place];  Service: General;  Laterality: Right;  . LAPAROSCOPY N/A 08/27/2015   Procedure: LAPAROSCOPY DIAGNOSTIC;  Surgeon: Stark Klein, MD;  Location: [place];  Service: General;  Laterality: N/A;  . PROSTATECTOMY  08/2004   2nd to [place]  . ROTATOR CUFF REPAIR Bilateral 10/2008   11,10   No Known Allergies Prior to Admission medications   Medication Sig Start Date End Date Taking? Authorizing Provider  famotidine (PEPCID) 20 MG tablet Take 1 tablet (20 mg total) by mouth at bedtime. 12/08/15   Barton Dubois, MD  feeding supplement, ENSURE ENLIVE, (ENSURE ENLIVE) LIQD Take 237 mLs by mouth 2 (two) times daily between meals. 12/08/15   Barton Dubois, MD  Iron Polysacch Cmplx-B12-FA 150-0.025-1 MG CAPS Take 1 capsule by mouth  daily. Patient not taking: Reported on 02/26/2016 12/08/15   Barton Dubois, MD  mupirocin ointment (BACTROBAN) 2 % Apply 1 application topically 2 (two) times daily. 02/27/16   Tereasa Coop, PA-C  pantoprazole (PROTONIX) 40 MG tablet Take 1 tablet (40 mg total) by mouth 2 (two) times daily. Patient not taking: Reported on 02/26/2016 12/08/15   Barton Dubois, MD   Social History   Social History  . Marital status: Married    Spouse name: N/A  . Number of children: 0  . Years of education: N/A   Occupational History  . Not  on file.   Social History Main Topics  . Smoking status: Current Some Day Smoker    Packs/day: 0.25    Years: 10.00    Types: Cigarettes  . Smokeless tobacco: Never Used  . Alcohol use Yes     Comment: "three sips of beer a day"  . Drug use: No  . Sexual activity: Yes   Other Topics Concern  . Not on file   Social History Narrative   Married. Education; high school. 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   Objective:  Physical Exam  Constitutional: He appears well-developed and well-nourished. No distress.  HENT:  Head: Normocephalic and atraumatic.  Eyes: Conjunctivae are normal.  Neck: Neck supple.  Cardiovascular: Normal rate.   Pulmonary/Chest: Effort normal.  Neurological: He is alert.  Skin: Skin is warm and dry.  Wound is healing well. Approx some mild induration surround the would on the dorsal aspect of the index finger as well as thumb 1cm of ecchymosis on medial aspect of thumb Positive tenderness Mild erythema No drainage Some warmth  Psychiatric: He has a normal mood and affect. His behavior is normal.  Nursing note and vitals reviewed.  BP 122/70 (BP Location: Right Arm, Patient Position: Sitting, Cuff Size: Small)   Pulse 80   Ht 5\' 8"  (1.727 m)   Wt 136 lb (61.7 kg)   SpO2 100%   BMI 20.68 kg/m  Assessment & Plan:  Laceration of left index finger without foreign body without damage to nail, subsequent encounter  Laceration of left thumb without foreign body without damage to nail, subsequent encounter  Pt was told to return Fri and so he mistakenly RTC for suture removal in 3d rather than in 10d. Wound healing well. Reviewed wound care. Rescheduled for suture removal in 1 wk.  I personally performed the services described in this documentation, which was scribed in my presence. The recorded information has been  reviewed and considered, and addended by me as needed.   Perry Robinson, M.D.  [facility] [address] [place], [place] [address] [phone] phone [phone] fax  03/03/16 7:24 PM

## 2016-02-29 NOTE — Patient Instructions (Signed)
     IF you received an x-ray today, you will receive an invoice from [facility]. Please contact [facility] at [phone] with questions or concerns regarding your invoice.   IF you received labwork today, you will receive an invoice from [facility]. Please contact [facility] at [phone] with questions or concerns regarding your invoice.   Our billing staff will not be able to assist you with questions regarding bills from these companies.  You will be contacted with the lab results as soon as they are available. The fastest way to get your results is to activate your My Chart account. Instructions are located on the last page of this paperwork. If you have not heard from us regarding the results in 2 weeks, please contact this office.     

## 2016-03-07 ENCOUNTER — Ambulatory Visit (INDEPENDENT_AMBULATORY_CARE_PROVIDER_SITE_OTHER): Payer: Medicare Other | Admitting: Physician Assistant

## 2016-03-07 VITALS — BP 150/60 | HR 83 | Temp 98.5°F | Resp 18 | Ht 69.0 in | Wt 139.0 lb

## 2016-03-07 DIAGNOSIS — Z4802 Encounter for removal of sutures: Secondary | ICD-10-CM

## 2016-03-07 NOTE — Patient Instructions (Signed)
     IF you received an x-ray today, you will receive an invoice from [facility]. Please contact [facility] at [phone] with questions or concerns regarding your invoice.   IF you received labwork today, you will receive an invoice from [facility]. Please contact [facility] at [phone] with questions or concerns regarding your invoice.   Our billing staff will not be able to assist you with questions regarding bills from these companies.  You will be contacted with the lab results as soon as they are available. The fastest way to get your results is to activate your My Chart account. Instructions are located on the last page of this paperwork. If you have not heard from us regarding the results in 2 weeks, please contact this office.     

## 2016-03-08 ENCOUNTER — Ambulatory Visit (INDEPENDENT_AMBULATORY_CARE_PROVIDER_SITE_OTHER): Payer: Medicare Other | Admitting: Physician Assistant

## 2016-03-08 DIAGNOSIS — S61211D Laceration without foreign body of left index finger without damage to nail, subsequent encounter: Secondary | ICD-10-CM

## 2016-03-08 NOTE — Progress Notes (Signed)
Patient returns for suture removal. Reports the mupirocin bandage caused the scab to fall off.  Thumb anesthetized via digital nerve block and one suture removed.  He tolerated this well.  Granulation tissue present about the wound bed.  Mupirocin bandage placed.  Advised he keep this covered during the day and allow the wound to dry QHS. Philis Fendt, MS, PA-C 7:17 PM, 03/08/2016

## 2016-03-10 NOTE — Progress Notes (Signed)
  03/10/2016 8:53 AM   DOB: 09-19-1950 / MRN: [id number]  SUBJECTIVE:  Perry Robinson is a 66 y.o. male presenting for suture removal s/p laceration repair.  No problems thus far. Has been keeping the wound moist and covered during work. Denies exquisite tenderness.   He has No Known Allergies.   He  has a past medical history of Cancer of pancreas, body ([place]) (08/2015); Cataract; Diverticulosis; GERD (gastroesophageal reflux disease); Hypertension; Inguinal hernia; and Prostate cancer ([place]) (07/11/2004).    He  reports that he has been smoking Cigarettes.  He has a 2.50 pack-year smoking history. He has never used smokeless tobacco. He reports that he drinks alcohol. He reports that he does not use drugs. He  reports that he currently engages in sexual activity. The patient  has a past surgical history that includes Prostatectomy (08/2004); Cataract extraction (Bilateral, 2011); Eye surgery; Rotator cuff repair (Bilateral, 10/2008); EUS (N/A, 06/28/2015); Fine needle aspiration (06/28/2015); laparoscopy (N/A, 08/27/2015); Inguinal hernia repair (Right, 08/27/2015); and Insertion of mesh (Right, 08/27/2015).  His family history includes Prostate cancer in his brother, father, and maternal grandfather; Thyroid disease in his mother.  ROS  The problem list and medications were reviewed and updated by myself where necessary and exist elsewhere in the encounter.   OBJECTIVE:  BP (!) 150/60 (BP Location: Right Arm, Patient Position: Sitting, Cuff Size: Normal)   Pulse 83   Temp 98.5 F (36.9 C) (Oral)   Resp 18   Ht 5\' 9"  (1.753 m)   Wt 139 lb (63 kg)   SpO2 99%   BMI 20.53 kg/m   Physical Exam  Constitutional: He appears well-developed and well-nourished.  Neurological: He is alert.  Skin: Skin is warm and dry.  Psychiatric: He has a normal mood and affect.    No results found for this or any previous visit (from the past 72 hour(s)).  No results found.  ASSESSMENT AND PLAN:  Lauriano  was seen today for suture / staple removal.  Diagnoses and all orders for this visit:  Visit for suture removal Comments: All but one suture removed.  He will keep the wound moist and attempt to carfully remove scabbing so I can visulize the last suture.  RTC in 24 hours.     The patient is advised to call or return to clinic if he does not see an improvement in symptoms, or to seek the care of the closest emergency department if he worsens with the above plan.   Philis Fendt, MHS, PA-C [facility] 03/10/2016 8:53 AM

## 2016-04-04 ENCOUNTER — Encounter: Payer: Self-pay | Admitting: Internal Medicine

## 2016-05-01 DIAGNOSIS — C61 Malignant neoplasm of prostate: Secondary | ICD-10-CM | POA: Diagnosis not present

## 2016-05-08 ENCOUNTER — Encounter (HOSPITAL_COMMUNITY)
Admission: RE | Admit: 2016-05-08 | Discharge: 2016-05-08 | Disposition: A | Discharge status: Home / Self Care | Payer: Medicare Other | Source: Ambulatory Visit | Attending: Urology | Admitting: Urology

## 2016-05-08 DIAGNOSIS — C61 Malignant neoplasm of prostate: Secondary | ICD-10-CM | POA: Insufficient documentation

## 2016-05-08 MED ORDER — TECHNETIUM TC 99M MEDRONATE IV KIT: 25.0000 | PACK | Freq: Once | INTRAVENOUS | Status: DC | PRN

## 2016-05-23 DIAGNOSIS — C61 Malignant neoplasm of prostate: Secondary | ICD-10-CM | POA: Diagnosis not present

## 2016-05-28 DIAGNOSIS — C61 Malignant neoplasm of prostate: Secondary | ICD-10-CM | POA: Diagnosis not present

## 2016-05-28 DIAGNOSIS — R3121 Asymptomatic microscopic hematuria: Secondary | ICD-10-CM | POA: Diagnosis not present

## 2016-06-27 NOTE — Progress Notes (Deleted)
Perry Robinson  66 y.o. gentleman with stage IIB, T1, N1 adenocarcinoma of the body of the pancreas radiation completed 11-20-15 FU.

## 2016-06-30 ENCOUNTER — Encounter: Payer: Self-pay | Admitting: *Deleted

## 2016-06-30 ENCOUNTER — Ambulatory Visit
Admission: RE | Admit: 2016-06-30 | Discharge: 2016-06-30 | Disposition: A | Discharge status: Home / Self Care | Payer: Medicare Other | Source: Ambulatory Visit | Attending: Radiation Oncology | Admitting: Radiation Oncology

## 2016-06-30 NOTE — Progress Notes (Signed)
[place] left a message about his 1030 follow up appointment with Shona Simpson, P.A. Asked to call back and ask for Tonie at [phone].

## 2016-07-09 ENCOUNTER — Other Ambulatory Visit: Payer: Self-pay | Admitting: Urology

## 2016-07-09 DIAGNOSIS — C61 Malignant neoplasm of prostate: Secondary | ICD-10-CM

## 2016-10-06 ENCOUNTER — Other Ambulatory Visit: Payer: Self-pay | Admitting: Urology

## 2016-10-06 DIAGNOSIS — C61 Malignant neoplasm of prostate: Secondary | ICD-10-CM | POA: Diagnosis not present

## 2016-10-06 DIAGNOSIS — N130 Hydronephrosis with ureteropelvic junction obstruction: Secondary | ICD-10-CM | POA: Diagnosis not present

## 2016-10-06 DIAGNOSIS — R310 Gross hematuria: Secondary | ICD-10-CM | POA: Diagnosis not present

## 2016-10-08 NOTE — H&P (Signed)
CC/HPI: I have prostate cancer.     [place] returns today for right groin pain that began about a week ago. The pain is more of a soreness that doesn't keep him from his activities. He has no nausea or fever. he has mild dizziness. He is also passing some tissue in the urine. He has 40-60 RBC's on UA today. He has a history of radiation cystitis.   He has a history of D0 prostate cancer. he had an RRP in 2007 and EXRT in 2009. He had a PSA recurrence and was on the EMBARK trial on the enzalutamide only arm. He is now on the continuation arm. He had a CT and bone scan prior to this visit that were negative for mets but his PSA is up to 3.35. The PSA was 2.15 in 10/15. I don't have the levels from when he was on the trial. He like not being on the med.   He was found to have incidental pancreatic cancer on one of his scans and had a partial pancreatectomy in 6/17. He had radiation after the surgery and has been doing well.    ALLERGIES: Hydrocodone-Acetaminophen ELIX    MEDICATIONS: None   GU PSH: Radical Prostatectomy - 2008      PSH Notes: Radiation Therapy,  Shoulder Surgery Right,  Prostatect Retropubic Radical W/ Bilat Pelv Lymphadenectomy  Partial pancreatectomy with radiation therapy.   NON-GU PSH: Pancreatic surgery    GU PMH: Microscopic hematuria, He has 3-10 RBC's today but has a history of radiation cystitis with a w/u in 10/16. I will follow this and rescope if it persists. - 05/28/2016 Prostate Cancer (Worsening), He has a doing well but his PSA is rising off of enzalutamide. I am going to have him return in 6 months with a PSA and will restage, possibly with an Axumin PET if the PSA continues to rise. - 05/28/2016, Prostate cancer, - 07/16/2015 Unil Inguinal Hernia W/O obst or gang,non-recurrent, Inguinal hernia, right - 05/28/2015 Gross hematuria, Gross hematuria - 12/27/2014, Gross Hematuria, - 2014 Radiation cystitis (w/o hematuria), Irradiation cystitis - 12/27/2014 Renal  calculus, Bilateral kidney stones - 12/27/2014 Urinary Frequency, Urinary frequency - 2015 ED due to arterial insufficiency, Erectile dysfunction due to arterial insufficiency - 2014 Perianal venous thrombosis, Thrombosed External Hemorrhoids - 2014 Testicular pain, unspecified, Testicular Pain - 2014      PMH Notes: Pancreatic cancer.    NON-GU PMH: Disease of pancreas, unspecified, Pancreatic mass - 07/16/2015 Encounter for general adult medical examination without abnormal findings, Encounter for preventive health examination - 05/28/2015 Other intervertebral disc degeneration, lumbar region, Lumbar Disc Degeneration - 2014 Personal history of other diseases of the circulatory system, History of hypertension - 2014 Personal history of other diseases of the digestive system, History of esophageal reflux - 2014 Radiculopathy, lumbar region, Lumbar Radiculopathy - 2014 Sciatica, unspecified side, Sciatica - 2014    FAMILY HISTORY: Prostate Cancer - Father Urologic Disorder - Father   SOCIAL HISTORY: Marital Status: Married Preferred Language: English; Race: Black or African American Current Smoking Status: Patient smokes.   Tobacco Use Assessment Completed: Used Tobacco in last 30 days? Drinks 1 caffeinated drink per day.     Notes: Current every day smoker, Alcohol Use, Occupation: retired, Caffeine Use, Marital History - Currently Married, Tobacco Use   REVIEW OF SYSTEMS:    GU Review Male:   Patient reports frequent urination, hard to postpone urination, and get up at night to urinate. Patient denies burning/ pain with urination, leakage  of urine, stream starts and stops, trouble starting your stream, have to strain to urinate , erection problems, and penile pain.  Gastrointestinal (Upper):   Patient denies nausea, vomiting, and indigestion/ heartburn.  Gastrointestinal (Lower):   Patient denies diarrhea and constipation.  Constitutional:   Patient denies fever, night sweats,  weight loss, and fatigue.  Skin:   Patient denies skin rash/ lesion and itching.  Eyes:   Patient denies blurred vision and double vision.  Ears/ Nose/ Throat:   Patient denies sore throat and sinus problems.  Hematologic/Lymphatic:   Patient denies swollen glands and easy bruising.  Cardiovascular:   Patient denies leg swelling and chest pains.  Respiratory:   Patient denies cough and shortness of breath.  Endocrine:   Patient denies excessive thirst.  Musculoskeletal:   Patient denies back pain and joint pain.  Neurological:   Patient denies dizziness and headaches.  Psychologic:   Patient denies depression and anxiety.   VITAL SIGNS:      10/06/2016 01:33 PM  Weight 144 lb / 65.32 kg  Height 71 in / 180.34 cm  BP 142/73 mmHg  Pulse 84 /min  Temperature 98.6 F / 37 C  BMI 20.1 kg/m   GU PHYSICAL EXAMINATION:    Anus and Perineum: No hemorrhoids. No anal stenosis. No rectal fissure, no anal fissure. No edema, no dimple, no perineal tenderness, no anal tenderness.  Scrotum: No lesions. No edema. No cysts. No warts.  Epididymides: Right: no spermatocele, no masses, no cysts, no tenderness, no induration, no enlargement. Left: no spermatocele, no masses, no cysts, no tenderness, no induration, no enlargement.  Testes: No tenderness, no swelling, no enlargement left testes. No tenderness, no swelling, no enlargement right testes. Normal location left testes. Normal location right testes. No mass, no cyst, no varicocele, no hydrocele left testes. No mass, no cyst, no varicocele, no hydrocele right testes.  Urethral Meatus: Normal size. No lesion, no wart, no discharge, no polyp. Normal location.  Penis: Circumcised, no warts, no cracks. No dorsal Peyronie's plaques, no left corporal Peyronie's plaques, no right corporal Peyronie's plaques, no scarring, no warts. No balanitis, no meatal stenosis.  Prostate: Prostate surgically absent.  Seminal Vesicles: Prostate surgically absent.   Sphincter Tone: Normal sphincter. No rectal tenderness. No rectal mass.    MULTI-SYSTEM PHYSICAL EXAMINATION:    Constitutional: Well-nourished. No physical deformities. Normally developed. Good grooming.  Neck: Neck symmetrical, not swollen. Normal tracheal position.  Respiratory: No labored breathing, no use of accessory muscles. CTA  Cardiovascular: Normal temperature, normal extremity pulses, no swelling, no varicosities. RRR without murmur  Lymphatic: No enlargement of neck, axillae, groin.  Skin: No paleness, no jaundice, no cyanosis. No lesion, no ulcer, no rash.  Neurologic / Psychiatric: Oriented to time, oriented to place, oriented to person. No depression, no anxiety, no agitation.  Gastrointestinal: No hernia. No mass, no tenderness, no rigidity, non obese abdomen.   Musculoskeletal: Normal gait and station of head and neck.     PAST DATA REVIEWED:  Source Of History:  Patient  Urine Test Review:   Urinalysis  X-Ray Review: C.T. Hematuria: Reviewed Films. Discussed With Patient.     05/23/16 12/01/13 09/16/13 08/12/13 04/26/13 12/01/12 08/25/12 03/01/10  PSA  Total PSA 3.35 ng/dl 2.19  1.81  1.65  1.27  0.56  0.32  0.07     PROCEDURES:         C.T. Hematuria - 74178, [id number]  He has left hydro and impending right hydro to the level  of the bladder with soft tissue thickening but no stones. See final report.       ISOVUE 300 125CC          PVR Ultrasound - 51798  Scanned Volume: 93 cc         Urinalysis w/Scope Dipstick Dipstick Cont'd Micro  Color: Yellow Bilirubin: Neg WBC/hpf: NS (Not Seen)  Appearance: Cloudy Ketones: Neg RBC/hpf: 40 - 60/hpf  Specific Gravity: 1.015 Blood: 3+ Bacteria: NS (Not Seen)  pH: 6.0 Protein: Neg Cystals: NS (Not Seen)  Glucose: Neg Urobilinogen: 0.2 Casts: NS (Not Seen)    Nitrites: Neg Trichomonas: Not Present    Leukocyte Esterase: Neg Mucous: Not Present      Epithelial Cells: 0 - 5/hpf      Yeast: NS (Not Seen)      Sperm:  Not Present    ASSESSMENT:      ICD-9 ICD-10 Details  1 GU:   Prostate Cancer - 185 Prostate Cancer - C61 Worsening - He has probable recurrent prostate cancer at the bladder base with left and impending right hydronephrosis. I am going to get a PSA today and will get him set up for cystoscopy with bil. retrogrades and possible stents. He will probably need to begin ADT but I want to get the PSA first. I have reviewed the risks of the procedure including bleeding, infection, inability to place stents and need for a perc, thrombotic events and anesthetic complications. I reviewed the side effects of ADT as well.   2    Gross hematuria - R31.0   3    Hydronephrosis - N13.0    PLAN:           Orders Labs BUN/Creatinine(Stat), PSA  X-Rays: C.T. Hematuria With and Without I.V. Contrast - This is to replace the CT that was scheduled on 10/20/16.   X-Ray Notes: . History:  Hematuria: Yes/No  Patient to see MD after exam: Yes/No  Previous exam: CT / IVP/ US/ KUB/ None  When:  Where:  Diabetic: Yes/ No  BUN/ Creatinine:  Date of last BUN Creatinine:  Weight in pounds:  Allergy- IV Contrast: Yes/ No  Conflicting diabetic meds: Yes/ No  Diabetic Meds:  Prior Authorization #: [id number] (MCR/AARP)          Schedule Return Visit/Planned Activity: ASAP - Schedule Surgery          Document Letter(s):  Created for Patient: Clinical Summary

## 2016-10-09 ENCOUNTER — Ambulatory Visit (HOSPITAL_COMMUNITY): Payer: Medicare Other | Admitting: Certified Registered Nurse Anesthetist

## 2016-10-09 ENCOUNTER — Ambulatory Visit (HOSPITAL_COMMUNITY): Payer: Medicare Other

## 2016-10-09 ENCOUNTER — Encounter (HOSPITAL_COMMUNITY): Payer: Self-pay | Admitting: *Deleted

## 2016-10-09 ENCOUNTER — Ambulatory Visit (HOSPITAL_COMMUNITY)
Admission: RE | Admit: 2016-10-09 | Discharge: 2016-10-09 | Disposition: A | Discharge status: Home / Self Care | Payer: Medicare Other | Source: Ambulatory Visit | Attending: Urology | Admitting: Urology

## 2016-10-09 ENCOUNTER — Encounter (HOSPITAL_COMMUNITY)
Admission: RE | Disposition: A | Discharge status: Home / Self Care | Payer: Self-pay | Source: Ambulatory Visit | Attending: Urology

## 2016-10-09 DIAGNOSIS — M5430 Sciatica, unspecified side: Secondary | ICD-10-CM | POA: Diagnosis not present

## 2016-10-09 DIAGNOSIS — N1330 Unspecified hydronephrosis: Secondary | ICD-10-CM | POA: Diagnosis not present

## 2016-10-09 DIAGNOSIS — R310 Gross hematuria: Secondary | ICD-10-CM | POA: Insufficient documentation

## 2016-10-09 DIAGNOSIS — Z923 Personal history of irradiation: Secondary | ICD-10-CM | POA: Insufficient documentation

## 2016-10-09 DIAGNOSIS — Z8507 Personal history of malignant neoplasm of pancreas: Secondary | ICD-10-CM | POA: Diagnosis not present

## 2016-10-09 DIAGNOSIS — Z8719 Personal history of other diseases of the digestive system: Secondary | ICD-10-CM | POA: Diagnosis not present

## 2016-10-09 DIAGNOSIS — Z8546 Personal history of malignant neoplasm of prostate: Secondary | ICD-10-CM | POA: Insufficient documentation

## 2016-10-09 DIAGNOSIS — D494 Neoplasm of unspecified behavior of bladder: Secondary | ICD-10-CM | POA: Diagnosis not present

## 2016-10-09 DIAGNOSIS — C61 Malignant neoplasm of prostate: Secondary | ICD-10-CM | POA: Diagnosis not present

## 2016-10-09 DIAGNOSIS — R9721 Rising PSA following treatment for malignant neoplasm of prostate: Secondary | ICD-10-CM | POA: Insufficient documentation

## 2016-10-09 DIAGNOSIS — M5416 Radiculopathy, lumbar region: Secondary | ICD-10-CM | POA: Diagnosis not present

## 2016-10-09 DIAGNOSIS — Z841 Family history of disorders of kidney and ureter: Secondary | ICD-10-CM | POA: Diagnosis not present

## 2016-10-09 DIAGNOSIS — Z9079 Acquired absence of other genital organ(s): Secondary | ICD-10-CM | POA: Diagnosis not present

## 2016-10-09 DIAGNOSIS — N131 Hydronephrosis with ureteral stricture, not elsewhere classified: Secondary | ICD-10-CM | POA: Insufficient documentation

## 2016-10-09 DIAGNOSIS — Z79899 Other long term (current) drug therapy: Secondary | ICD-10-CM | POA: Diagnosis not present

## 2016-10-09 DIAGNOSIS — I10 Essential (primary) hypertension: Secondary | ICD-10-CM | POA: Diagnosis not present

## 2016-10-09 DIAGNOSIS — Z885 Allergy status to narcotic agent status: Secondary | ICD-10-CM | POA: Insufficient documentation

## 2016-10-09 DIAGNOSIS — Z8042 Family history of malignant neoplasm of prostate: Secondary | ICD-10-CM | POA: Insufficient documentation

## 2016-10-09 DIAGNOSIS — Z90411 Acquired partial absence of pancreas: Secondary | ICD-10-CM | POA: Insufficient documentation

## 2016-10-09 DIAGNOSIS — N135 Crossing vessel and stricture of ureter without hydronephrosis: Secondary | ICD-10-CM | POA: Diagnosis not present

## 2016-10-09 DIAGNOSIS — C675 Malignant neoplasm of bladder neck: Secondary | ICD-10-CM | POA: Insufficient documentation

## 2016-10-09 DIAGNOSIS — F17200 Nicotine dependence, unspecified, uncomplicated: Secondary | ICD-10-CM | POA: Diagnosis not present

## 2016-10-09 DIAGNOSIS — M5136 Other intervertebral disc degeneration, lumbar region: Secondary | ICD-10-CM | POA: Diagnosis not present

## 2016-10-09 HISTORY — PX: CYSTOSCOPY W/ URETERAL STENT PLACEMENT: SHX1429

## 2016-10-09 LAB — BASIC METABOLIC PANEL
ANION GAP: 7 (ref 5–15)
BUN: 15 mg/dL (ref 6–20)
CALCIUM: 9.1 mg/dL (ref 8.9–10.3)
CO2: 25 mmol/L (ref 22–32)
Chloride: 110 mmol/L (ref 101–111)
Creatinine, Ser: 0.85 mg/dL (ref 0.61–1.24)
GFR calc Af Amer: >60 mL/min (ref >60)
GLUCOSE: 110 mg/dL — AB (ref 65–99)
POTASSIUM: 4.4 mmol/L (ref 3.5–5.1)
SODIUM: 142 mmol/L (ref 135–145)

## 2016-10-09 LAB — CBC
HEMATOCRIT: 39.4 % (ref 39.0–52.0)
HEMOGLOBIN: 13.9 g/dL (ref 13.0–17.0)
MCH: 32.9 pg (ref 26.0–34.0)
MCHC: 35.3 g/dL (ref 30.0–36.0)
MCV: 93.4 fL (ref 78.0–100.0)
Platelets: 210 10*3/uL (ref 150–400)
RBC: 4.22 MIL/uL (ref 4.22–5.81)
RDW: 15.2 % (ref 11.5–15.5)
WBC: 6.2 10*3/uL (ref 4.0–10.5)

## 2016-10-09 SURGERY — CYSTOSCOPY, WITH RETROGRADE PYELOGRAM AND URETERAL STENT INSERTION
Anesthesia: General | Laterality: Bilateral

## 2016-10-09 MED ORDER — LIDOCAINE 2% (20 MG/ML) 5 ML SYRINGE
INTRAMUSCULAR | Status: AC
  Filled 2016-10-09: qty 5

## 2016-10-09 MED ORDER — OXYCODONE HCL 5 MG PO TABS: 5.0000 mg | ORAL_TABLET | Freq: Once | ORAL | Status: DC | PRN

## 2016-10-09 MED ORDER — ACETAMINOPHEN 650 MG RE SUPP
650.0000 mg | RECTAL | Status: DC | PRN
  Filled 2016-10-09: qty 1

## 2016-10-09 MED ORDER — MIDAZOLAM HCL 2 MG/2ML IJ SOLN
INTRAMUSCULAR | Status: AC
  Filled 2016-10-09: qty 2

## 2016-10-09 MED ORDER — SODIUM CHLORIDE 0.9 % IV SOLN: 250.0000 mL | INTRAVENOUS | Status: DC | PRN

## 2016-10-09 MED ORDER — HYDROMORPHONE HCL-NACL 0.5-0.9 MG/ML-% IV SOSY
PREFILLED_SYRINGE | INTRAVENOUS | Status: AC
  Administered 2016-10-09: 0.25 mg via INTRAVENOUS
  Filled 2016-10-09: qty 1

## 2016-10-09 MED ORDER — DEXAMETHASONE SODIUM PHOSPHATE 10 MG/ML IJ SOLN
INTRAMUSCULAR | Status: DC | PRN
Start: 2016-10-09 — End: 2016-10-09
  Administered 2016-10-09: 10 mg via INTRAVENOUS

## 2016-10-09 MED ORDER — SODIUM CHLORIDE 0.9 % IR SOLN
Status: DC | PRN
  Administered 2016-10-09: 6000 mL via INTRAVESICAL

## 2016-10-09 MED ORDER — HYDROCODONE-ACETAMINOPHEN 5-325 MG PO TABS: 1.0000 | ORAL_TABLET | Freq: Four times a day (QID) | ORAL | 0 refills | Status: AC | PRN

## 2016-10-09 MED ORDER — MIDAZOLAM HCL 5 MG/5ML IJ SOLN
INTRAMUSCULAR | Status: DC | PRN
  Administered 2016-10-09: 2 mg via INTRAVENOUS

## 2016-10-09 MED ORDER — FENTANYL CITRATE (PF) 100 MCG/2ML IJ SOLN
INTRAMUSCULAR | Status: AC
  Filled 2016-10-09: qty 2

## 2016-10-09 MED ORDER — CEFAZOLIN SODIUM-DEXTROSE 2-4 GM/100ML-% IV SOLN
2.0000 g | INTRAVENOUS | Status: AC
  Administered 2016-10-09: 2 g via INTRAVENOUS
  Filled 2016-10-09: qty 100

## 2016-10-09 MED ORDER — OXYBUTYNIN CHLORIDE 5 MG PO TABS: 5.0000 mg | ORAL_TABLET | Freq: Three times a day (TID) | ORAL | 1 refills | Status: AC | PRN

## 2016-10-09 MED ORDER — DEXAMETHASONE SODIUM PHOSPHATE 10 MG/ML IJ SOLN
INTRAMUSCULAR | Status: AC
  Filled 2016-10-09: qty 1

## 2016-10-09 MED ORDER — ACETAMINOPHEN 325 MG PO TABS: 650.0000 mg | ORAL_TABLET | ORAL | Status: DC | PRN

## 2016-10-09 MED ORDER — PROPOFOL 10 MG/ML IV BOLUS
INTRAVENOUS | Status: DC | PRN
  Administered 2016-10-09: 150 mg via INTRAVENOUS
  Administered 2016-10-09: 20 mg via INTRAVENOUS

## 2016-10-09 MED ORDER — SODIUM CHLORIDE 0.9% FLUSH: 3.0000 mL | Freq: Two times a day (BID) | INTRAVENOUS | Status: DC

## 2016-10-09 MED ORDER — SODIUM CHLORIDE 0.9% FLUSH: 3.0000 mL | INTRAVENOUS | Status: DC | PRN

## 2016-10-09 MED ORDER — PROPOFOL 10 MG/ML IV BOLUS
INTRAVENOUS | Status: AC
  Filled 2016-10-09: qty 20

## 2016-10-09 MED ORDER — LACTATED RINGERS IV SOLN
INTRAVENOUS | Status: DC
  Administered 2016-10-09 (×2): via INTRAVENOUS

## 2016-10-09 MED ORDER — HYDROMORPHONE HCL-NACL 0.5-0.9 MG/ML-% IV SOSY
0.2500 mg | PREFILLED_SYRINGE | INTRAVENOUS | Status: DC | PRN
  Administered 2016-10-09: 0.25 mg via INTRAVENOUS

## 2016-10-09 MED ORDER — ONDANSETRON HCL 4 MG/2ML IJ SOLN
INTRAMUSCULAR | Status: AC
  Filled 2016-10-09: qty 2

## 2016-10-09 MED ORDER — OXYCODONE HCL 5 MG/5ML PO SOLN
5.0000 mg | Freq: Once | ORAL | Status: DC | PRN
  Filled 2016-10-09: qty 5

## 2016-10-09 MED ORDER — ONDANSETRON HCL 4 MG/2ML IJ SOLN
INTRAMUSCULAR | Status: DC | PRN
  Administered 2016-10-09: 4 mg via INTRAVENOUS

## 2016-10-09 MED ORDER — IOHEXOL 300 MG/ML  SOLN
INTRAMUSCULAR | Status: DC | PRN
  Administered 2016-10-09: 8 mL

## 2016-10-09 MED ORDER — LIDOCAINE 2% (20 MG/ML) 5 ML SYRINGE
INTRAMUSCULAR | Status: DC | PRN
  Administered 2016-10-09: 60 mg via INTRAVENOUS

## 2016-10-09 MED ORDER — FENTANYL CITRATE (PF) 100 MCG/2ML IJ SOLN
INTRAMUSCULAR | Status: DC | PRN
  Administered 2016-10-09 (×4): 25 ug via INTRAVENOUS

## 2016-10-09 MED ORDER — MORPHINE SULFATE (PF) 4 MG/ML IV SOLN: 2.0000 mg | INTRAVENOUS | Status: DC | PRN

## 2016-10-09 MED ORDER — OXYCODONE HCL 5 MG PO TABS: 5.0000 mg | ORAL_TABLET | ORAL | Status: DC | PRN

## 2016-10-09 SURGICAL SUPPLY — 13 items
BAG URO CATCHER STRL LF (MISCELLANEOUS) ×2 IMPLANT
CATH URET 5FR 28IN OPEN ENDED (CATHETERS) IMPLANT
CLOTH BEACON ORANGE TIMEOUT ST (SAFETY) ×2 IMPLANT
COVER SURGICAL LIGHT HANDLE (MISCELLANEOUS) ×2 IMPLANT
GLOVE SURG SS PI 8.0 STRL IVOR (GLOVE) IMPLANT
GOWN STRL REUS W/TWL XL LVL3 (GOWN DISPOSABLE) ×2 IMPLANT
GUIDEWIRE ANG ZIPWIRE 038X150 (WIRE) ×2 IMPLANT
GUIDEWIRE STR DUAL SENSOR (WIRE) ×2 IMPLANT
LOOP CUT BIPOLAR 24F LRG (ELECTROSURGICAL) ×2 IMPLANT
MANIFOLD NEPTUNE II (INSTRUMENTS) ×2 IMPLANT
PACK CYSTO (CUSTOM PROCEDURE TRAY) ×2 IMPLANT
STENT URET 6FRX26 CONTOUR (STENTS) ×2 IMPLANT
TUBING CONNECTING 10 (TUBING) ×2 IMPLANT

## 2016-10-09 NOTE — Anesthesia Postprocedure Evaluation (Signed)
Anesthesia Post Note  Patient: Perry Robinson  Procedure(s) Performed: Procedure(s) (LRB): CYSTOSCOPY WITH BILATERAL  RETROGRADE AND URETERAL STENT PLACEMENT, TURBT (Bilateral)     Patient location during evaluation: PACU Anesthesia Type: General Level of consciousness: awake Pain management: pain level controlled Vital Signs Assessment: post-procedure vital signs reviewed and stable Cardiovascular status: stable Postop Assessment: no signs of nausea or vomiting Anesthetic complications: no    Last Vitals:  Vitals:   10/09/16 1545 10/09/16 1556  BP: (!) 163/99 (!) 165/89  Pulse: 67 72  Resp: 15 18  Temp: (!) 36.3 C 36.4 C  SpO2: 98% 100%    Last Pain:  Vitals:   10/09/16 1524  TempSrc:   PainSc: 0-No pain                 Rosemaria Inabinet

## 2016-10-09 NOTE — Transfer of Care (Signed)
Immediate Anesthesia Transfer of Care Note  Patient: Perry Robinson  Procedure(s) Performed: Procedure(s): CYSTOSCOPY WITH BILATERAL  RETROGRADE AND URETERAL STENT PLACEMENT, TURBT (Bilateral)  Patient Location: PACU  Anesthesia Type:General  Level of Consciousness: Patient easily awoken, sedated, comfortable, cooperative, following commands, responds to stimulation.   Airway & Oxygen Therapy: Patient spontaneously breathing, ventilating well, oxygen via simple oxygen mask.  Post-op Assessment: Report given to PACU RN, vital signs reviewed and stable, moving all extremities.   Post vital signs: Reviewed and stable.  Complications: No apparent anesthesia complications Last Vitals:  Vitals:   10/09/16 1200  BP: (!) 155/83  Pulse: 82  Resp: 18  Temp: 37.2 C  SpO2: 98%    Last Pain:  Vitals:   10/09/16 1237  TempSrc:   PainSc: 4       Patients Stated Pain Goal: 4 (88/82/80 0349)  Complications: No apparent anesthesia complications

## 2016-10-09 NOTE — Anesthesia Preprocedure Evaluation (Signed)
Anesthesia Evaluation  Patient identified by MRN, date of birth, ID band Patient awake    Reviewed: Allergy & Precautions, NPO status , Patient's Chart, lab work & pertinent test results  Airway Mallampati: II  TM Distance: >3 FB Neck ROM: Full    Dental no notable dental hx.    Pulmonary Current Smoker,    breath sounds clear to auscultation       Cardiovascular hypertension,  Rhythm:Regular Rate:Normal     Neuro/Psych    GI/Hepatic GERD  ,  Endo/Other  negative endocrine ROS  Renal/GU Prostate ca c hydronephross     Musculoskeletal   Abdominal   Peds  Hematology   Anesthesia Other Findings   Reproductive/Obstetrics                             Anesthesia Physical Anesthesia Plan  ASA: III  Anesthesia Plan: General   Post-op Pain Management:    Induction: Intravenous  PONV Risk Score and Plan: 2 and Ondansetron and Dexamethasone  Airway Management Planned: Natural Airway and Nasal Cannula  Additional Equipment:   Intra-op Plan:   Post-operative Plan: Extubation in OR  Informed Consent: I have reviewed the patients History and Physical, chart, labs and discussed the procedure including the risks, benefits and alternatives for the proposed anesthesia with the patient or authorized representative who has indicated his/her understanding and acceptance.   Dental advisory given  Plan Discussed with: CRNA  Anesthesia Plan Comments:         Anesthesia Quick Evaluation

## 2016-10-09 NOTE — Brief Op Note (Signed)
10/09/2016  2:51 PM  PATIENT:  Perry Robinson  66 y.o. male  PRE-OPERATIVE DIAGNOSIS:  prostate cancer with bilateral hydronephrosis  POST-OPERATIVE DIAGNOSIS:  prostate cancer with bilateral hydronephrosis                                                          Necrotic tumor at the bladder neck   PROCEDURE:  Procedure(s): CYSTOSCOPY WITH BILATERAL  RETROGRADE AND URETERAL STENT PLACEMENT, TURBT (Bilateral)  SURGEON:  Surgeon(s) and Role:    Irine Seal, MD - Primary  PHYSICIAN ASSISTANT:   ASSISTANTS: none   ANESTHESIA:   general  EBL:  Total I/O In: -  Out: 25 [Blood:25]  BLOOD ADMINISTERED:none  DRAINS: bilateral 6 x 26 JJ stents   LOCAL MEDICATIONS USED:  NONE  SPECIMEN:  Source of Specimen:  bladder neck TUR chips  DISPOSITION OF SPECIMEN:  PATHOLOGY  COUNTS:  YES  TOURNIQUET:  * No tourniquets in log *  DICTATION: .Other Dictation: Dictation Number [id number]  PLAN OF CARE: Discharge to home after PACU  PATIENT DISPOSITION:  PACU - hemodynamically stable.   Delay start of Pharmacological VTE agent (>24hrs) due to surgical blood loss or risk of bleeding: yes

## 2016-10-09 NOTE — Discharge Instructions (Signed)
Ureteral Stent Implantation, Care After Refer to this sheet in the next few weeks. These instructions provide you with information about caring for yourself after your procedure. Your health care provider may also give you more specific instructions. Your treatment has been planned according to current medical practices, but problems sometimes occur. Call your health care provider if you have any problems or questions after your procedure. What can I expect after the procedure? After the procedure, it is common to have:  Nausea.  Mild pain when you urinate. You may feel this pain in your lower back or lower abdomen. Pain should stop within a few minutes after you urinate. This may last for up to 1 week.  A small amount of blood in your urine for several days.  Follow these instructions at home:  Medicines  Take over-the-counter and prescription medicines only as told by your health care provider.  If you were prescribed an antibiotic medicine, take it as told by your health care provider. Do not stop taking the antibiotic even if you start to feel better.  Do not drive for 24 hours if you received a sedative.  Do not drive or operate heavy machinery while taking prescription pain medicines. Activity  Return to your normal activities as told by your health care provider. Ask your health care provider what activities are safe for you.  Do not lift anything that is heavier than 10 lb (4.5 kg). Follow this limit for 1 week after your procedure, or for as long as told by your health care provider. General instructions  Watch for any blood in your urine. Call your health care provider if the amount of blood in your urine increases.  If you have a catheter: ? Follow instructions from your health care provider about taking care of your catheter and collection bag. ? Do not take baths, swim, or use a hot tub until your health care provider approves.  Drink enough fluid to keep your urine  clear or pale yellow.  Keep all follow-up visits as told by your health care provider. This is important. Contact a health care provider if:  You have pain that gets worse or does not get better with medicine, especially pain when you urinate.  You have difficulty urinating.  You feel nauseous or you vomit repeatedly during a period of more than 2 days after the procedure. Get help right away if:  Your urine is dark red or has blood clots in it.  You are leaking urine (have incontinence).  The end of the stent comes out of your urethra.  You cannot urinate.  You have sudden, sharp, or severe pain in your abdomen or lower back.  You have a fever.  You have recurrent prostate cancer at the base of the bladder that was causing the obstruction and bleeding,  We are going to need to resume testosterone suppression therapy with a medication called Firmagon.   We will give you 2 injections under the skin of the abdomen.   The risks of the medication include hot flashes, fatigue, potential  loss of sex drive and erectile dysfunction, loss of muscle mass, loss of bone mass and potential risks of worsening diabetes, heart disease and high cholesterol.  My office will set that up.   This information is not intended to replace advice given to you by your health care provider. Make sure you discuss any questions you have with your health care provider. Document Released: 10/20/2012 Document Revised: 07/26/2015 Document Reviewed:  09/01/2014 Elsevier Interactive Patient Education  [employer].

## 2016-10-09 NOTE — Interval H&P Note (Signed)
History and Physical Interval Note:  10/09/2016 1:14 PM  Perry Robinson  has presented today for surgery, with the diagnosis of prostate cancer with bilateral hydronephrosis  The various methods of treatment have been discussed with the patient and family. After consideration of risks, benefits and other options for treatment, the patient has consented to  Procedure(s): CYSTOSCOPY WITH BILATERAL  RETROGRADE POSSIBLE URETERAL STENT PLACEMENT (Bilateral) as a surgical intervention .  The patient's history has been reviewed, patient examined, no change in status, stable for surgery.  I have reviewed the patient's chart and labs.  Questions were answered to the patient's satisfaction.     Yamile Roedl J

## 2016-10-09 NOTE — Op Note (Signed)
Perry Robinson, Perry Robinson NO.:  [id number]  MEDICAL RECORD NO.:  [id number]  LOCATION:  [facility]                         FACILITY:  [facility]  PHYSICIAN:  Perry Robinson, M.D.    DATE OF BIRTH:  1950/04/13  DATE OF PROCEDURE:  10/09/2016 DATE OF DISCHARGE:                              OPERATIVE REPORT   PROCEDURE: 1. Cystoscopy with left retrograde pyelogram. 2. Insertion of bilateral double-J stents. 3. Transurethral resection of necrotic tumor from bladder neck.  PREOPERATIVE DIAGNOSIS:  Recurrent prostate cancer with bilateral ureteral obstruction same with necrotic tumor bladder neck.  SURGEON:  Perry Robinson, M.D.  ANESTHESIA:  General.  SPECIMEN:  Tumor chips from bladder neck.  DRAINS:  Bilateral 6-French 26 cm Contour double-J stents.  BLOOD LOSS:  Minimal.  COMPLICATIONS:  None.  INDICATIONS:  [name] is a 66 year old male with a history of prostate cancer initially treated with prostatectomy in 2007 followed by radiation for salvage in 2009.  He had a PSA recurrence and he was on the EMBARK trial with Enzalutamide and did well, but since stopping that, his PSA has been rising and was up to 3.35.  He was having some groin pain on his recent visit to the office and a CT scan was obtained because of the rising PSA and this study showed new onset of moderate left hydronephrosis to the bladder and mild right hydronephrosis to the bladder with a mass at the bladder base.  It is worrisome for recurrent cancer.  He is brought in today to undergo cystoscopy with attempted bilateral retrogrades and placing of bilateral stents.  Of note, a PSA done at his last visit last week was up to approximately 5.5, so he has a rapid doubling time  FINDINGS AND PROCEDURE:  He was taken to the operating room where a general anesthetic was induced.  He was given Ancef.  He was placed in lithotomy position.  His perineum and genitalia were prepped with Betadine solution  and he was draped in usual sterile fashion.  Cystoscopy was performed with a 23-French scope and 30-degree lens. Examination revealed a normal urethra.  The external sphincter was intact.  The prostate was absent, but there was a necrotic tumor with elevation of the trigone noted.  The bladder wall had mild trabeculation.  There were some increased vascularity consistent with radiation telangiectasias.  Initially, the ureteral orifices were not identified with a 30 degree lens, so the 70 degree lens was used and with a 70-degree lens I was able to visualize the left ureteral orifice.  An aberrance bridge was then used to angle a 5-French open-end catheter into the orifice.  I could not advance it very far, but I was able to instill some contrast, which revealed severe J hooking.  An attempt to pass a Sensor wire through the open-end catheter was unsuccessful due to angulation, but I was able to advance a zip wire with a curved tip past the obstruction into the ureter.  The open-end catheter was then advanced over the zip wire, and the zip wire was removed.  There was a brisk hydronephrotic drip.  A Sensor wire was then passed to the kidney through the open-end  catheter and the open-end catheter was then removed.  A 6-French 26 cm Contour double-J stent was passed without difficulty to the kidney under fluoroscopic guidance on the left.  The wire was removed leaving good coil in the kidney and a good coil in the bladder.  I then identified the right ureteral orifice with the 70 degree lens and using the open-end catheter to stiffen the wire, I was able to advance a Sensor wire to the kidney.  I did not perform retrograde on this side.  Once the wire was in the kidney, the open-end catheter was removed and a 6-French 26 cm Contour double-J stent was passed without difficulty to the kidney.  The wire was removed leaving good coil in the kidney and good coil in the bladder.  Further  inspection demonstrated fairly significant necrotic tumor at the right bladder neck.  It was felt the resection was indicated to reduce the risk of bleeding.  The urethra was calibrated to 32-French with Leander Rams sounds and a 28- French continuous flow resectoscope sheath was placed with the aid of a visual obturator.  The resectoscope was then fitted with an Glasgow handle, 30 degree lens and bipolar loop saline was used as the irrigant.  Resection of the necrotic friable tumor was performed approximately from 9 o'clock to 4 o'clock with great care being taken not to resect too distally into the external sphincter to avoid the risk of incontinence.  Once the area had been flattened and the necrotic tumor resected, the chips removed, the hemostasis was achieved.  Because of the limited area of resection, it was not felt the catheter was indicated.  The bladder was partially drained and the resectoscope was removed.  The patient was taken down from lithotomy position.  His anesthetic was reversed.  He was moved to recovery room in stable condition.  The tissue was sent to the pathology lab for evaluation.  He will eventually need to resume androgen deprivation therapy and hopefully will be able to have the stents removed in the future if he responds.     Perry Robinson, M.D.     JJW/MEDQ  D:  10/09/2016  T:  10/09/2016  Job:  [id number]

## 2016-10-10 ENCOUNTER — Encounter (HOSPITAL_COMMUNITY): Payer: Self-pay | Admitting: Urology

## 2016-10-20 ENCOUNTER — Encounter (HOSPITAL_COMMUNITY)
Admission: RE | Admit: 2016-10-20 | Discharge: 2016-10-20 | Disposition: A | Discharge status: Home / Self Care | Payer: Medicare Other | Source: Ambulatory Visit | Attending: Urology | Admitting: Urology

## 2016-10-20 DIAGNOSIS — C61 Malignant neoplasm of prostate: Secondary | ICD-10-CM

## 2016-10-20 MED ORDER — TECHNETIUM TC 99M MEDRONATE IV KIT
21.9000 | PACK | Freq: Once | INTRAVENOUS | Status: AC | PRN
  Administered 2016-10-20: 21.9 via INTRAVENOUS

## 2016-10-23 DIAGNOSIS — N130 Hydronephrosis with ureteropelvic junction obstruction: Secondary | ICD-10-CM | POA: Diagnosis not present

## 2016-10-23 DIAGNOSIS — C61 Malignant neoplasm of prostate: Secondary | ICD-10-CM | POA: Diagnosis not present

## 2016-10-23 DIAGNOSIS — R9721 Rising PSA following treatment for malignant neoplasm of prostate: Secondary | ICD-10-CM | POA: Diagnosis not present

## 2016-10-24 ENCOUNTER — Telehealth: Payer: Self-pay | Admitting: *Deleted

## 2016-10-24 NOTE — Telephone Encounter (Deleted)
-----   Message from Ladell Pier, MD sent at 10/23/2016  5:23 PM EDT ----- Please see if he will agree to see me or lisa week of 9/3 F/u of pancreas cancer and discuss treatment of prostate cancer thanks ----- Message ----- From: Irine Seal, MD Sent: 10/23/2016   5:11 PM To: Ladell Pier, MD  Perry Robinson was found to have bilateral ureteral obstruction from a poorly differentiated carcinoma that is consistent with recurrent prostate cancer.   He has a rising PSA.   I gave him firmagon today and will be transitioning him to Lupron in about a month.   His PSADT is <9 months and with the poorly differentiated disease I would like to have him considered for the addition of abiraterone to the ADT and was wondering if you could see him again or whether he needs to see Roxy Cedar.   If you are willing to see him for that, please let me know what we need to do to get this set up.    Thanks,  Jenny Reichmann

## 2016-10-24 NOTE — Telephone Encounter (Signed)
Left message for pt to call office. Needs appointment to discuss treatment for prostate cancer, follow up pancreas cancer.

## 2016-11-03 ENCOUNTER — Emergency Department (HOSPITAL_COMMUNITY): Payer: Medicare Other

## 2016-11-03 ENCOUNTER — Encounter (HOSPITAL_COMMUNITY): Payer: Self-pay | Admitting: Radiology

## 2016-11-03 ENCOUNTER — Emergency Department (HOSPITAL_COMMUNITY)
Admission: EM | Admit: 2016-11-03 | Discharge: 2016-11-03 | Disposition: A | Discharge status: Home / Self Care | Payer: Medicare Other | Attending: Emergency Medicine | Admitting: Emergency Medicine

## 2016-11-03 DIAGNOSIS — C251 Malignant neoplasm of body of pancreas: Secondary | ICD-10-CM | POA: Insufficient documentation

## 2016-11-03 DIAGNOSIS — I10 Essential (primary) hypertension: Secondary | ICD-10-CM | POA: Diagnosis not present

## 2016-11-03 DIAGNOSIS — F17210 Nicotine dependence, cigarettes, uncomplicated: Secondary | ICD-10-CM | POA: Insufficient documentation

## 2016-11-03 DIAGNOSIS — M546 Pain in thoracic spine: Secondary | ICD-10-CM | POA: Diagnosis not present

## 2016-11-03 DIAGNOSIS — R1031 Right lower quadrant pain: Secondary | ICD-10-CM | POA: Diagnosis present

## 2016-11-03 DIAGNOSIS — R1013 Epigastric pain: Secondary | ICD-10-CM | POA: Diagnosis not present

## 2016-11-03 DIAGNOSIS — R52 Pain, unspecified: Secondary | ICD-10-CM | POA: Diagnosis not present

## 2016-11-03 DIAGNOSIS — Z8546 Personal history of malignant neoplasm of prostate: Secondary | ICD-10-CM | POA: Diagnosis not present

## 2016-11-03 DIAGNOSIS — N390 Urinary tract infection, site not specified: Secondary | ICD-10-CM

## 2016-11-03 DIAGNOSIS — K760 Fatty (change of) liver, not elsewhere classified: Secondary | ICD-10-CM | POA: Diagnosis not present

## 2016-11-03 LAB — URINALYSIS, ROUTINE W REFLEX MICROSCOPIC
Bilirubin Urine: NEGATIVE
GLUCOSE, UA: NEGATIVE mg/dL
Ketones, ur: NEGATIVE mg/dL
NITRITE: NEGATIVE
PH: 8 (ref 5.0–8.0)
Protein, ur: 100 mg/dL — AB
Specific Gravity, Urine: 1.016 (ref 1.005–1.030)
Squamous Epithelial / HPF: NONE SEEN

## 2016-11-03 LAB — COMPREHENSIVE METABOLIC PANEL
ALT: 26 U/L (ref 17–63)
ANION GAP: 9 (ref 5–15)
AST: 37 U/L (ref 15–41)
Albumin: 4.4 g/dL (ref 3.5–5.0)
Alkaline Phosphatase: 108 U/L (ref 38–126)
BUN: 15 mg/dL (ref 6–20)
CHLORIDE: 105 mmol/L (ref 101–111)
CO2: 26 mmol/L (ref 22–32)
CREATININE: 0.73 mg/dL (ref 0.61–1.24)
Calcium: 9.2 mg/dL (ref 8.9–10.3)
Glucose, Bld: 163 mg/dL — ABNORMAL HIGH (ref 65–99)
POTASSIUM: 3.9 mmol/L (ref 3.5–5.1)
SODIUM: 140 mmol/L (ref 135–145)
Total Bilirubin: 0.8 mg/dL (ref 0.3–1.2)
Total Protein: 7.4 g/dL (ref 6.5–8.1)

## 2016-11-03 LAB — CBC WITH DIFFERENTIAL/PLATELET
Basophils Absolute: 0 10*3/uL (ref 0.0–0.1)
Basophils Relative: 0 %
EOS ABS: 0 10*3/uL (ref 0.0–0.7)
EOS PCT: 1 %
HCT: 40.1 % (ref 39.0–52.0)
Hemoglobin: 13.9 g/dL (ref 13.0–17.0)
Lymphocytes Relative: 30 %
Lymphs Abs: 1.8 10*3/uL (ref 0.7–4.0)
MCH: 32.9 pg (ref 26.0–34.0)
MCHC: 34.7 g/dL (ref 30.0–36.0)
MCV: 94.8 fL (ref 78.0–100.0)
MONO ABS: 0.4 10*3/uL (ref 0.1–1.0)
Monocytes Relative: 6 %
Neutro Abs: 3.7 10*3/uL (ref 1.7–7.7)
Neutrophils Relative %: 63 %
PLATELETS: 239 10*3/uL (ref 150–400)
RBC: 4.23 MIL/uL (ref 4.22–5.81)
RDW: 14.8 % (ref 11.5–15.5)
WBC: 5.9 10*3/uL (ref 4.0–10.5)

## 2016-11-03 LAB — LIPASE, BLOOD: LIPASE: 18 U/L (ref 11–51)

## 2016-11-03 MED ORDER — MORPHINE SULFATE (PF) 4 MG/ML IV SOLN
4.0000 mg | Freq: Once | INTRAVENOUS | Status: AC
  Administered 2016-11-03: 4 mg via INTRAVENOUS
  Filled 2016-11-03: qty 1

## 2016-11-03 MED ORDER — CEPHALEXIN 500 MG PO CAPS: 500.0000 mg | ORAL_CAPSULE | Freq: Four times a day (QID) | ORAL | 0 refills | Status: AC

## 2016-11-03 MED ORDER — ONDANSETRON HCL 4 MG/2ML IJ SOLN
4.0000 mg | Freq: Once | INTRAMUSCULAR | Status: AC
  Administered 2016-11-03: 4 mg via INTRAVENOUS
  Filled 2016-11-03: qty 2

## 2016-11-03 MED ORDER — IOPAMIDOL (ISOVUE-300) INJECTION 61%
100.0000 mL | Freq: Once | INTRAVENOUS | Status: AC | PRN
  Administered 2016-11-03: 100 mL via INTRAVENOUS

## 2016-11-03 MED ORDER — SODIUM CHLORIDE 0.9 % IV SOLN
INTRAVENOUS | Status: DC
  Administered 2016-11-03: 12:00:00 via INTRAVENOUS

## 2016-11-03 MED ORDER — IOPAMIDOL (ISOVUE-300) INJECTION 61%
INTRAVENOUS | Status: AC
  Filled 2016-11-03: qty 100

## 2016-11-03 NOTE — ED Triage Notes (Addendum)
Pt states that he has had lower back pain since last night and has had a stent in  and is now having groin pain again. Alert and oriented. Pt is a poor historian.

## 2016-11-03 NOTE — ED Provider Notes (Signed)
[facility] DEPT Provider Note   CSN: [id number] Arrival date & time: 11/03/16  1055     History   Chief Complaint Chief Complaint  Patient presents with  . Leg Pain  . Groin Pain    HPI Perry Robinson is a 66 y.o. male.  66 year old male with history of prostate and pancreatic cancer presents with acute onset of sharp stabbing pain that began this perineum and radiated to his right sacral region. States he was sitting down when this occurred. No associated dysuria or hematuria. No prior history of renal colic. No fever, vomiting, change in stools. Pain does not radiate down to his leg and denies any right footdrop. Symptoms have been persistent and worse with standing or movement. No treatment use prior to arrival.      Past Medical History:  Diagnosis Date  . Cancer of pancreas, body ([place]) 08/2015  . Cataract   . Diverticulosis   . GERD (gastroesophageal reflux disease)   . Hypertension    patient denies  . Inguinal hernia   . Prostate cancer ([place]) 07/11/2004    Patient Active Problem List   Diagnosis Date Noted  . Genetic testing 12/27/2015  . Acquired gastric fistula 12/21/2015  . Melena   . Protein-calorie malnutrition, severe ([place])   . Acute blood loss anemia   . Hematemesis 12/05/2015  . Family history of prostate cancer 09/12/2015  . Carcinoma of body of pancreas ([place]) 08/27/2015  . Cancer of pancreas, body ([place]) 06/28/2015  . History of cervical discectomy 05/29/2015  . Diverticulosis of colon 06/09/2013  . Back pain with left-sided radiculopathy 04/13/2013  . Essential hypertension   . Prostate cancer [facility])     Past Surgical History:  Procedure Laterality Date  . CATARACT EXTRACTION Bilateral 2011  . CYSTOSCOPY W/ URETERAL STENT PLACEMENT Bilateral 10/09/2016   Procedure: CYSTOSCOPY WITH BILATERAL  RETROGRADE AND URETERAL STENT PLACEMENT, TURBT;  Surgeon: Irine Seal, MD;  Location: [facility];  Service: Urology;  Laterality: Bilateral;  . EUS N/A  06/28/2015   Procedure: UPPER ENDOSCOPIC ULTRASOUND (EUS) LINEAR;  Surgeon: [name], MD;  Location: [facility];  Service: Endoscopy;  Laterality: N/A;  spoke with patient about new check in/procedure times JB  . EYE SURGERY    . FINE NEEDLE ASPIRATION  06/28/2015   Procedure: FINE NEEDLE ASPIRATION;  Surgeon: [name], MD;  Location: [facility];  Service: Endoscopy;;  . INGUINAL HERNIA REPAIR Right 08/27/2015   Procedure: OPEN RIGHT INGUINAL HERNIA ;  Surgeon: Ralene Ok, MD;  Location: [facility];  Service: General;  Laterality: Right;  . INSERTION OF MESH Right 08/27/2015   Procedure: INSERTION OF MESH;  Surgeon: Ralene Ok, MD;  Location: [facility];  Service: General;  Laterality: Right;  . LAPAROSCOPY N/A 08/27/2015   Procedure: LAPAROSCOPY DIAGNOSTIC;  Surgeon: Stark Klein, MD;  Location: [facility];  Service: General;  Laterality: N/A;  . PROSTATECTOMY  08/2004   2nd to [place]  . ROTATOR CUFF REPAIR Bilateral 10/2008   11,10       Home Medications    Prior to Admission medications   Medication Sig Start Date End Date Taking? Authorizing Provider  HYDROcodone-acetaminophen (NORCO) 5-325 MG tablet Take 1 tablet by mouth every 6 (six) hours as needed for moderate pain. 10/09/16   Irine Seal, MD  oxybutynin (DITROPAN) 5 MG tablet Take 1 tablet (5 mg total) by mouth 3 (three) times daily as needed for bladder spasms. 10/09/16   Irine Seal, MD  Family History Family History  Problem Relation Age of Onset  . Thyroid disease Mother   . Prostate cancer Father   . Prostate cancer Brother        dx. late 68s; treated with stem cell therapy  . Prostate cancer Maternal Grandfather        d. middle ages  . Colon cancer Neg Hx     Social History Social History  Substance Use Topics  . Smoking status: Current Some Day Smoker    Packs/day: 0.25    Years: 10.00    Types: Cigarettes  . Smokeless tobacco: Never Used  . Alcohol use Yes     Comment: "three sips of beer a day"       Allergies   Patient has no known allergies.   Review of Systems Review of Systems  All other systems reviewed and are negative.    Physical Exam Updated Vital Signs BP (!) 143/82 (BP Location: Right Arm)   Pulse 66   Temp 97.8 F (36.6 C) (Oral)   Resp (!) 22   SpO2 97%   Physical Exam  Abdominal: There is tenderness in the right lower quadrant and epigastric area. There is guarding. There is no rigidity. Hernia confirmed negative in the right inguinal area.    Genitourinary: Uncircumcised. No paraphimosis.     Lymphadenopathy: No inguinal adenopathy noted on the right side.     ED Treatments / Results  Labs (all labs ordered are listed, but only abnormal results are displayed) Labs Reviewed  URINE CULTURE  CBC WITH DIFFERENTIAL/PLATELET  COMPREHENSIVE METABOLIC PANEL  LIPASE, BLOOD  URINALYSIS, ROUTINE W REFLEX MICROSCOPIC    EKG  EKG Interpretation None       Radiology No results found.  Procedures Procedures (including critical care time)  Medications Ordered in ED Medications  0.9 %  sodium chloride infusion (not administered)  morphine 4 MG/ML injection 4 mg (not administered)  ondansetron (ZOFRAN) injection 4 mg (not administered)     Initial Impression / Assessment and Plan / ED Course  I have reviewed the triage vital signs and the nursing notes.  Pertinent labs & imaging results that were available during my care of the patient were reviewed by me and considered in my medical decision making (see chart for details).   patient medicated here for pain and feels much better. CT scan results reviewed with patient and family. Urinalysis noted and will place on antibiotics and patient will follow-up with his urologist this week  Final Clinical Impressions(s) / ED Diagnoses   Final diagnoses:  None    New Prescriptions New Prescriptions   No medications on file     Lacretia Leigh, MD 11/03/16 1523

## 2016-11-03 NOTE — ED Notes (Signed)
ED Provider at bedside. 

## 2016-11-03 NOTE — ED Notes (Signed)
Bed: [facility]02 Expected date:  Expected time:  Means of arrival:  Comments: [place]

## 2016-11-04 ENCOUNTER — Telehealth: Payer: Self-pay | Admitting: Oncology

## 2016-11-04 ENCOUNTER — Telehealth: Payer: Self-pay

## 2016-11-04 LAB — URINE CULTURE: CULTURE: NO GROWTH

## 2016-11-04 NOTE — Telephone Encounter (Signed)
Pt called that he want to ED yesterday for back pain. A CT abd/pelvis was done with impression #3 likely recurrent prostate cancer. He was dx a kidney infection. ED gave pt antibiotic for the infection. He filled rx this AM.  He will start it now that he has eaten.  Pt mentioned he received call from [facility] about an appt. See phone note 8/24. Suanne Marker sent to schedule MD ASAP. Labs done 9/3.

## 2016-11-04 NOTE — Telephone Encounter (Signed)
sw pt to confirm 9/13 appt at 0900 per sch msg

## 2016-11-13 ENCOUNTER — Ambulatory Visit (HOSPITAL_BASED_OUTPATIENT_CLINIC_OR_DEPARTMENT_OTHER): Payer: Medicare Other | Admitting: Oncology

## 2016-11-13 VITALS — BP 150/70 | HR 69 | Temp 98.8°F | Resp 20 | Ht 71.0 in | Wt 141.0 lb

## 2016-11-13 DIAGNOSIS — Z720 Tobacco use: Secondary | ICD-10-CM | POA: Diagnosis not present

## 2016-11-13 DIAGNOSIS — Z8546 Personal history of malignant neoplasm of prostate: Secondary | ICD-10-CM

## 2016-11-13 DIAGNOSIS — Z923 Personal history of irradiation: Secondary | ICD-10-CM

## 2016-11-13 DIAGNOSIS — C251 Malignant neoplasm of body of pancreas: Secondary | ICD-10-CM | POA: Diagnosis not present

## 2016-11-13 DIAGNOSIS — C61 Malignant neoplasm of prostate: Secondary | ICD-10-CM

## 2016-11-13 NOTE — Progress Notes (Signed)
[facility] OFFICE PROGRESS NOTE   Diagnosis: Pancreas cancer, prostate cancer  INTERVAL HISTORY:   Mr. Middlesworth was last seen in the oncology clinic in August 2017. He completed adjuvant radiation for pancreas cancer in September 2017. He declined adjuvant chemotherapy and follow-up in the medical oncology clinic.  He was diagnosed with prostate cancer in 2007 and underwent a prostatectomy. He was treated with radiation in 2009. He has been treated for a PSA recurrence with enzalutamide. The PSA was higher at 5.64 on 10/06/2016. He was noted to have stable abdominal/retroperitoneal lymph nodes on a CT 11/03/2016. Increased soft tissue in the prostatic bed with involvement of the posterior bladder. Bilateral ureteral stents had been placed. No evidence of recurrent pancreas cancer.  He underwent placement of bilateral double-J stents and transurethral resection of necrotic tumor from the bladder neck by Dr. Jeffie Pollock on 10/09/2016. The pathology ([id number]) was consistent with prostatic adenocarcinoma.  He reports resolution of low abdomen/pelvic discomfort following placement of the stents.  Mr. Minehart reports feeling well today. Good appetite. He was treated with firmagon 10/23/2016 and is scheduled for a one-month follow-up with Dr. Jeffie Pollock. He has mild soreness at the injection sites.   Objective:  Vital signs in last 24 hours:  Blood pressure (!) 150/70, pulse 69, temperature 98.8 F (37.1 C), temperature source Oral, resp. rate 20, height 5\' 11"  (1.803 m), weight 141 lb (64 kg), SpO2 100 %.    HEENT: Neck without mass Lymphatics: "Shotty "mobile left axillary node, no other cervical, supraclavicular, axillary, or inguinal nodes Resp: Lungs clear bilaterally Cardio: Regular rate and rhythm GI: No hepatosplenomegaly, no mass, nontender Vascular: No leg edema     Lab Results:  Lab Results  Component Value Date   WBC 5.9 11/03/2016   HGB 13.9 11/03/2016   HCT  40.1 11/03/2016   MCV 94.8 11/03/2016   PLT 239 11/03/2016   NEUTROABS 3.7 11/03/2016    CMP     Component Value Date/Time   NA 140 11/03/2016 1212   NA 140 10/09/2015 1001   K 3.9 11/03/2016 1212   K 3.7 10/09/2015 1001   CL 105 11/03/2016 1212   CO2 26 11/03/2016 1212   CO2 25 10/09/2015 1001   GLUCOSE 163 (H) 11/03/2016 1212   GLUCOSE 167 (H) 10/09/2015 1001   BUN 15 11/03/2016 1212   BUN 11.0 10/09/2015 1001   CREATININE 0.73 11/03/2016 1212   CREATININE 0.66 (L) 12/25/2015 1036   CREATININE 0.7 10/09/2015 1001   CALCIUM 9.2 11/03/2016 1212   CALCIUM 9.6 10/09/2015 1001   PROT 7.4 11/03/2016 1212   PROT 7.1 10/09/2015 1001   ALBUMIN 4.4 11/03/2016 1212   ALBUMIN 3.7 10/09/2015 1001   AST 37 11/03/2016 1212   AST 23 10/09/2015 1001   ALT 26 11/03/2016 1212   ALT 17 10/09/2015 1001   ALKPHOS 108 11/03/2016 1212   ALKPHOS 94 10/09/2015 1001   BILITOT 0.8 11/03/2016 1212   BILITOT 0.36 10/09/2015 1001   GFRNONAA >60 11/03/2016 1212   GFRNONAA >89 12/25/2015 1036   GFRAA >60 11/03/2016 1212   GFRAA >89 12/25/2015 1036    Medications: I have reviewed the patient's current medications.  Assessment/Plan: 1. Adenocarcinoma of the pancreas body, clinical stage IA (T1 N0); pathologic stage T1, N1 ? EUS 06/28/2015 revealed no evidence of vascular invasion or lymphadenopathy, FNA biopsy of the pancreas body mass confirmed adenocarcinoma ? No evidence for metastatic disease on a staging chest CT 07/04/2015 ? [facility] CT  06/07/2015 confirmed a pancreas body mass, questionable extension to the superior mesenteric artery ? Elevated CA 19-9 ? Status post open distal subtotal pancreatectomy and splenectomy 08/27/2015. Pathology showed invasive moderately differentiated adenocarcinoma spanning 1.8 cm in greatest dimension. Associated high-grade pancreatic intraepithelial neoplasia. Background chronic pancreatitis. Margins negative. 3 of 11 lymph nodes positive for  metastatic adenocarcinoma. Benign spleen. pT1, pN1 ? Initiation of adjuvant radiation 10/09/2015, completed 11/20/2015 ( refused Xeloda)  2. Prostate cancer in 2006, status post a prostatectomy 2007, external beam radiation therapy 2009  Started on the [employer] clinical trial with enzalutamide for a PSA only recurrence  PSA rising August 2018, CT with bilateral hydronephrosis, recurrent tumor at the bladder neck   ? Status post placement of bilateral ureter stents and transurethral resection of bladder tumor 10/09/2016 ? Negative bone scan 10/20/2016 ? Firmagon initiated 10/23/2016 ? CT abdomen/pelvis 11/03/2016-no evidence of recurrent pancreas cancer, increased size of a soft tissue mass at the posterior bladder 3. COPD/ongoing tobacco use  4. Hypertension   Disposition:  Mr. Martin appears well. He remains in clinical remission from pancreas cancer. He remains at significant risk of developing recurrent pancreas cancer over the next few years.  He is been diagnosed locally recurrent prostate cancer. The tumor has progressed on enzalutamide. He was treated with a [place] antagonist 10/23/2016. He is referred to consider the indication for abiraterone therapy.  There is improvement in disease-free and overall survival in patients with metastatic prostate cancer treated with abiraterone in addition to androgen deprivation therapy.  He has a limited tumor burden and his survival may be limited by pancreas cancer. I do not recommend abiraterone unless he progresses on antiandrogen therapy.  I will consult with Dr. Jeffie Pollock. My initial impression is to continue treatment with Lupron with close clinical follow-up.  Mr. Noyce will return for an office visit in 4 months. I'm available to see him sooner as needed.  30 minutes were spent with the patient today. The majority of the time was used for counseling and coordination of care.  Donneta Romberg, MD  11/13/2016  9:44 AM

## 2016-11-14 ENCOUNTER — Telehealth: Payer: Self-pay | Admitting: Oncology

## 2016-11-14 NOTE — Telephone Encounter (Signed)
Scheduled appt per 9/13 los - lab and f.u in 4 months - reminder letter sent in the mail.

## 2016-11-25 ENCOUNTER — Other Ambulatory Visit: Payer: Self-pay | Admitting: Urology

## 2016-11-25 DIAGNOSIS — C61 Malignant neoplasm of prostate: Secondary | ICD-10-CM

## 2016-12-01 DIAGNOSIS — C61 Malignant neoplasm of prostate: Secondary | ICD-10-CM | POA: Diagnosis not present

## 2016-12-01 DIAGNOSIS — R9721 Rising PSA following treatment for malignant neoplasm of prostate: Secondary | ICD-10-CM | POA: Diagnosis not present

## 2016-12-01 DIAGNOSIS — N130 Hydronephrosis with ureteropelvic junction obstruction: Secondary | ICD-10-CM | POA: Diagnosis not present

## 2016-12-03 ENCOUNTER — Encounter (HOSPITAL_COMMUNITY)
Admission: RE | Admit: 2016-12-03 | Discharge: 2016-12-03 | Disposition: A | Discharge status: Home / Self Care | Payer: Medicare Other | Source: Ambulatory Visit | Attending: Urology | Admitting: Urology

## 2016-12-03 DIAGNOSIS — C61 Malignant neoplasm of prostate: Secondary | ICD-10-CM | POA: Insufficient documentation

## 2016-12-03 DIAGNOSIS — R102 Pelvic and perineal pain: Secondary | ICD-10-CM | POA: Diagnosis not present

## 2016-12-03 MED ORDER — TECHNETIUM TC 99M MEDRONATE IV KIT
21.5000 | PACK | Freq: Once | INTRAVENOUS | Status: AC | PRN
  Administered 2016-12-03: 21.5 via INTRAVENOUS

## 2017-02-06 ENCOUNTER — Other Ambulatory Visit: Payer: Self-pay | Admitting: Urology

## 2017-02-06 DIAGNOSIS — C61 Malignant neoplasm of prostate: Secondary | ICD-10-CM

## 2017-03-12 DIAGNOSIS — R3915 Urgency of urination: Secondary | ICD-10-CM | POA: Diagnosis not present

## 2017-03-16 ENCOUNTER — Other Ambulatory Visit: Payer: Medicare Other

## 2017-03-16 ENCOUNTER — Ambulatory Visit: Payer: Medicare Other | Admitting: Oncology

## 2017-03-20 DIAGNOSIS — C61 Malignant neoplasm of prostate: Secondary | ICD-10-CM | POA: Diagnosis not present

## 2017-03-23 ENCOUNTER — Encounter (HOSPITAL_COMMUNITY)
Admission: RE | Admit: 2017-03-23 | Discharge: 2017-03-23 | Disposition: A | Discharge status: Home / Self Care | Payer: Medicare HMO | Source: Ambulatory Visit | Attending: Urology | Admitting: Urology

## 2017-03-23 DIAGNOSIS — C61 Malignant neoplasm of prostate: Secondary | ICD-10-CM | POA: Diagnosis present

## 2017-03-23 DIAGNOSIS — Z8546 Personal history of malignant neoplasm of prostate: Secondary | ICD-10-CM | POA: Diagnosis not present

## 2017-03-23 MED ORDER — TECHNETIUM TC 99M MEDRONATE IV KIT
21.1000 | PACK | Freq: Once | INTRAVENOUS | Status: AC
  Administered 2017-03-23: 21.1 via INTRAVENOUS

## 2017-03-26 DIAGNOSIS — C61 Malignant neoplasm of prostate: Secondary | ICD-10-CM | POA: Diagnosis not present

## 2017-03-26 DIAGNOSIS — N130 Hydronephrosis with ureteropelvic junction obstruction: Secondary | ICD-10-CM | POA: Diagnosis not present

## 2017-04-09 ENCOUNTER — Other Ambulatory Visit (HOSPITAL_COMMUNITY): Payer: Medicare Other

## 2017-04-09 ENCOUNTER — Ambulatory Visit (HOSPITAL_COMMUNITY): Payer: Medicare Other

## 2017-04-30 IMAGING — NM NM BONE WHOLE BODY
2 series · 2 of 2 positions shown · non-contrast
Comparison: Bone scan 07/06/2014, baseline 12/09/2013

9FRCE Measurements for Bone Metastasis:

No evidence skeletal metastasis.

CLINICAL DATA: Prostate carcinoma. Research trial. [facility] - MDV
2311-13

EXAM:
NUCLEAR MEDICINE WHOLE BODY BONE SCAN
TECHNIQUE: Whole body anterior and posterior images were obtained approximately
3 hours after intravenous injection of radiopharmaceutical.
RADIOPHARMACEUTICALS:  25.0 mCi 2echnetium-66m MDP IV

[Series 1: whole body · 2.66mm/px · 1 of 1 slices shown (1 of 2)]
[im 1/1]
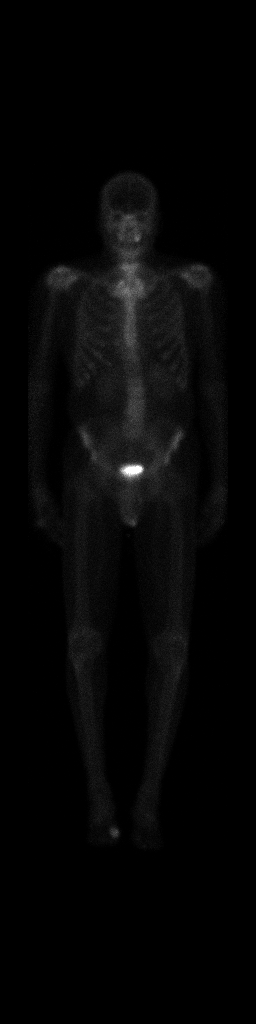

[Series 1: whole body · 2.66mm/px · 1 of 1 slices shown (2 of 2)]
[im 1/1]
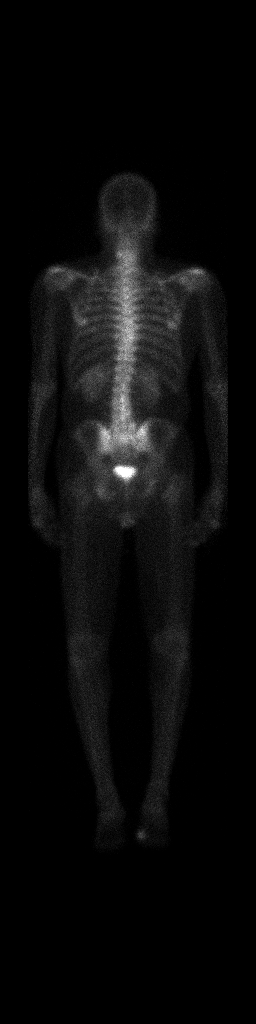

[2 of 2 positions shown; findings below may reference images not displayed]

FINDINGS: No focal uptake to suggest skeletal metastasis. Degenerative uptake
again demonstrated in the shoulders and sternoclavicular joints.
Degenerate uptake noted in the lower lumbar spine with
levoscoliosis. Intense uptake in the first toe on the RIGHT again
noted.
IMPRESSION: No evidence of skeletal metastasis.

Persistent uptake in the first RIGHT toe is likely posttraumatic.

## 2017-06-15 DIAGNOSIS — C61 Malignant neoplasm of prostate: Secondary | ICD-10-CM | POA: Diagnosis not present

## 2017-07-21 DIAGNOSIS — H00022 Hordeolum internum right lower eyelid: Secondary | ICD-10-CM | POA: Diagnosis not present

## 2017-07-22 DIAGNOSIS — R69 Illness, unspecified: Secondary | ICD-10-CM | POA: Diagnosis not present

## 2017-07-24 ENCOUNTER — Other Ambulatory Visit: Payer: Self-pay | Admitting: Urology

## 2017-07-24 DIAGNOSIS — C61 Malignant neoplasm of prostate: Secondary | ICD-10-CM

## 2017-08-25 DIAGNOSIS — C61 Malignant neoplasm of prostate: Secondary | ICD-10-CM | POA: Diagnosis not present

## 2017-08-31 DIAGNOSIS — M545 Low back pain: Secondary | ICD-10-CM | POA: Diagnosis not present

## 2017-08-31 DIAGNOSIS — R310 Gross hematuria: Secondary | ICD-10-CM | POA: Diagnosis not present

## 2017-08-31 DIAGNOSIS — C61 Malignant neoplasm of prostate: Secondary | ICD-10-CM | POA: Diagnosis not present

## 2017-09-15 DIAGNOSIS — C61 Malignant neoplasm of prostate: Secondary | ICD-10-CM | POA: Diagnosis not present

## 2017-09-23 ENCOUNTER — Encounter (HOSPITAL_COMMUNITY)
Admission: RE | Admit: 2017-09-23 | Discharge: 2017-09-23 | Disposition: A | Discharge status: Home / Self Care | Payer: Medicare HMO | Source: Ambulatory Visit | Attending: Urology | Admitting: Urology

## 2017-09-23 ENCOUNTER — Ambulatory Visit (HOSPITAL_COMMUNITY)
Admission: RE | Admit: 2017-09-23 | Discharge: 2017-09-23 | Disposition: A | Discharge status: Home / Self Care | Payer: Medicare HMO | Source: Ambulatory Visit | Attending: Urology | Admitting: Urology

## 2017-09-23 DIAGNOSIS — C61 Malignant neoplasm of prostate: Secondary | ICD-10-CM | POA: Diagnosis not present

## 2017-09-23 MED ORDER — TECHNETIUM TC 99M MEDRONATE IV KIT
20.0000 | PACK | Freq: Once | INTRAVENOUS | Status: AC | PRN
  Administered 2017-09-23: 19.5 via INTRAVENOUS

## 2017-09-30 DIAGNOSIS — C61 Malignant neoplasm of prostate: Secondary | ICD-10-CM | POA: Diagnosis not present

## 2017-10-07 DIAGNOSIS — R310 Gross hematuria: Secondary | ICD-10-CM | POA: Diagnosis not present

## 2017-10-07 DIAGNOSIS — C61 Malignant neoplasm of prostate: Secondary | ICD-10-CM | POA: Diagnosis not present

## 2017-10-07 DIAGNOSIS — R9721 Rising PSA following treatment for malignant neoplasm of prostate: Secondary | ICD-10-CM | POA: Diagnosis not present

## 2017-10-07 DIAGNOSIS — C7802 Secondary malignant neoplasm of left lung: Secondary | ICD-10-CM | POA: Diagnosis not present

## 2017-10-10 IMAGING — NM NM BONE WHOLE BODY
2 series · 2 of 2 positions shown · non-contrast
Comparison: 12/26/2014

CLINICAL DATA: [facility] RESEARCH STUDY, prostate cancer. Recent neck
surgery.

EXAM:
NUCLEAR MEDICINE WHOLE BODY BONE SCAN
TECHNIQUE: Whole body anterior and posterior images were obtained approximately
3 hours after intravenous injection of radiopharmaceutical.
RADIOPHARMACEUTICALS:  Twenty-seven mCi Vechnetium-SSm MDP IV

[Series 1: whole body · 2.66mm/px · 1 of 1 slices shown (1 of 2)]
[im 1/1]
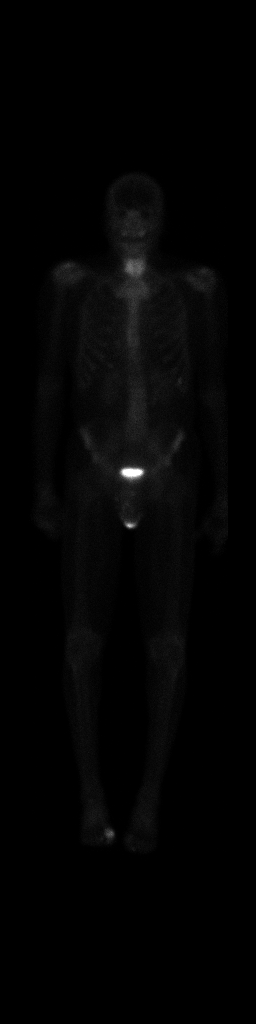

[Series 1: whole body · 2.66mm/px · 1 of 1 slices shown (2 of 2)]
[im 1/1]
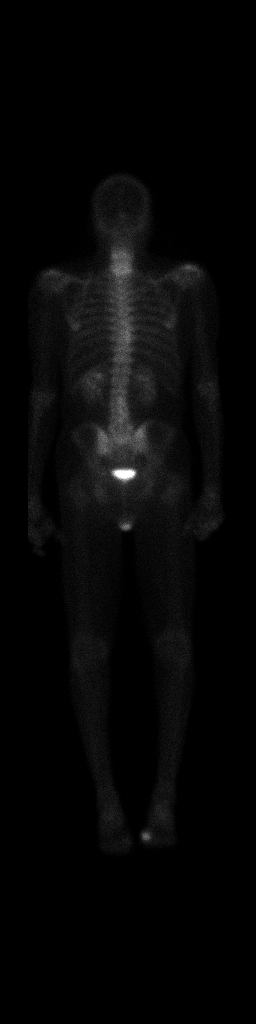

[2 of 2 positions shown; findings below may reference images not displayed]

SK19Z Measurements for Bone Metastasis:  Baseline 12/09/2013

No evidence skeletal metastasis
FINDINGS: No abnormal radiotracer accumulation within the axillary or
appendicular skeleton to suggest metastasis. Uptake within the
central neck relates to recent spine surgery.
IMPRESSION: No evidence skeletal metastasis.

New uptake in the central neck relates to  spine surgery.

## 2018-03-12 ENCOUNTER — Other Ambulatory Visit (HOSPITAL_COMMUNITY): Payer: Self-pay | Admitting: Urology

## 2018-03-12 DIAGNOSIS — C61 Malignant neoplasm of prostate: Secondary | ICD-10-CM

## 2018-03-15 DIAGNOSIS — R1031 Right lower quadrant pain: Secondary | ICD-10-CM | POA: Diagnosis not present

## 2018-03-15 DIAGNOSIS — R0981 Nasal congestion: Secondary | ICD-10-CM | POA: Diagnosis not present

## 2018-03-15 DIAGNOSIS — H6982 Other specified disorders of Eustachian tube, left ear: Secondary | ICD-10-CM | POA: Diagnosis not present

## 2018-03-15 DIAGNOSIS — I10 Essential (primary) hypertension: Secondary | ICD-10-CM | POA: Diagnosis not present

## 2018-03-15 DIAGNOSIS — H9012 Conductive hearing loss, unilateral, left ear, with unrestricted hearing on the contralateral side: Secondary | ICD-10-CM | POA: Diagnosis not present

## 2018-03-16 DIAGNOSIS — C61 Malignant neoplasm of prostate: Secondary | ICD-10-CM | POA: Diagnosis not present

## 2018-03-19 ENCOUNTER — Encounter (HOSPITAL_COMMUNITY): Payer: Medicare HMO

## 2018-03-26 ENCOUNTER — Encounter (HOSPITAL_COMMUNITY)
Admission: RE | Admit: 2018-03-26 | Discharge: 2018-03-26 | Disposition: A | Discharge status: Home / Self Care | Payer: Medicare HMO | Source: Ambulatory Visit | Attending: Urology | Admitting: Urology

## 2018-03-26 DIAGNOSIS — C61 Malignant neoplasm of prostate: Secondary | ICD-10-CM | POA: Insufficient documentation

## 2018-03-26 MED ORDER — TECHNETIUM TC 99M MEDRONATE IV KIT
20.0000 | PACK | Freq: Once | INTRAVENOUS | Status: AC | PRN
  Administered 2018-03-26: 22 via INTRAVENOUS

## 2018-04-01 DIAGNOSIS — C61 Malignant neoplasm of prostate: Secondary | ICD-10-CM | POA: Diagnosis not present

## 2018-04-01 DIAGNOSIS — N130 Hydronephrosis with ureteropelvic junction obstruction: Secondary | ICD-10-CM | POA: Diagnosis not present

## 2018-04-01 DIAGNOSIS — R9721 Rising PSA following treatment for malignant neoplasm of prostate: Secondary | ICD-10-CM | POA: Diagnosis not present

## 2018-04-12 DIAGNOSIS — C61 Malignant neoplasm of prostate: Secondary | ICD-10-CM | POA: Diagnosis not present

## 2018-04-12 DIAGNOSIS — R7309 Other abnormal glucose: Secondary | ICD-10-CM | POA: Diagnosis not present

## 2018-04-12 DIAGNOSIS — Z0000 Encounter for general adult medical examination without abnormal findings: Secondary | ICD-10-CM | POA: Diagnosis not present

## 2018-04-12 DIAGNOSIS — Z8507 Personal history of malignant neoplasm of pancreas: Secondary | ICD-10-CM | POA: Diagnosis not present

## 2018-04-12 DIAGNOSIS — Z1159 Encounter for screening for other viral diseases: Secondary | ICD-10-CM | POA: Diagnosis not present

## 2018-04-12 DIAGNOSIS — Z23 Encounter for immunization: Secondary | ICD-10-CM | POA: Diagnosis not present

## 2018-04-12 DIAGNOSIS — H6982 Other specified disorders of Eustachian tube, left ear: Secondary | ICD-10-CM | POA: Diagnosis not present

## 2018-04-12 DIAGNOSIS — E7800 Pure hypercholesterolemia, unspecified: Secondary | ICD-10-CM | POA: Diagnosis not present

## 2018-04-12 DIAGNOSIS — Z8679 Personal history of other diseases of the circulatory system: Secondary | ICD-10-CM | POA: Diagnosis not present

## 2018-04-12 DIAGNOSIS — I10 Essential (primary) hypertension: Secondary | ICD-10-CM | POA: Diagnosis not present

## 2018-04-21 DIAGNOSIS — R310 Gross hematuria: Secondary | ICD-10-CM | POA: Diagnosis not present

## 2018-04-21 DIAGNOSIS — N130 Hydronephrosis with ureteropelvic junction obstruction: Secondary | ICD-10-CM | POA: Diagnosis not present

## 2018-04-21 DIAGNOSIS — C61 Malignant neoplasm of prostate: Secondary | ICD-10-CM | POA: Diagnosis not present

## 2018-04-26 DIAGNOSIS — C61 Malignant neoplasm of prostate: Secondary | ICD-10-CM | POA: Diagnosis not present

## 2018-04-26 DIAGNOSIS — R310 Gross hematuria: Secondary | ICD-10-CM | POA: Diagnosis not present

## 2018-04-26 DIAGNOSIS — N130 Hydronephrosis with ureteropelvic junction obstruction: Secondary | ICD-10-CM | POA: Diagnosis not present

## 2018-04-30 DIAGNOSIS — C61 Malignant neoplasm of prostate: Secondary | ICD-10-CM | POA: Diagnosis not present

## 2018-04-30 DIAGNOSIS — Z5111 Encounter for antineoplastic chemotherapy: Secondary | ICD-10-CM | POA: Diagnosis not present

## 2018-05-05 DIAGNOSIS — N130 Hydronephrosis with ureteropelvic junction obstruction: Secondary | ICD-10-CM | POA: Diagnosis not present

## 2018-05-05 DIAGNOSIS — C61 Malignant neoplasm of prostate: Secondary | ICD-10-CM | POA: Diagnosis not present

## 2018-05-05 DIAGNOSIS — R310 Gross hematuria: Secondary | ICD-10-CM | POA: Diagnosis not present

## 2018-06-18 DIAGNOSIS — C61 Malignant neoplasm of prostate: Secondary | ICD-10-CM | POA: Diagnosis not present

## 2018-06-18 DIAGNOSIS — E559 Vitamin D deficiency, unspecified: Secondary | ICD-10-CM | POA: Diagnosis not present

## 2018-06-23 DIAGNOSIS — R310 Gross hematuria: Secondary | ICD-10-CM | POA: Diagnosis not present

## 2018-06-23 DIAGNOSIS — C61 Malignant neoplasm of prostate: Secondary | ICD-10-CM | POA: Diagnosis not present

## 2018-06-23 DIAGNOSIS — N130 Hydronephrosis with ureteropelvic junction obstruction: Secondary | ICD-10-CM | POA: Diagnosis not present

## 2018-07-01 ENCOUNTER — Other Ambulatory Visit: Payer: Self-pay | Admitting: Urology

## 2018-07-01 DIAGNOSIS — E569 Vitamin deficiency, unspecified: Secondary | ICD-10-CM

## 2018-07-02 DIAGNOSIS — R0789 Other chest pain: Secondary | ICD-10-CM | POA: Diagnosis not present

## 2018-07-02 DIAGNOSIS — M25511 Pain in right shoulder: Secondary | ICD-10-CM | POA: Diagnosis not present

## 2018-07-08 DIAGNOSIS — C61 Malignant neoplasm of prostate: Secondary | ICD-10-CM | POA: Diagnosis not present

## 2018-07-17 ENCOUNTER — Inpatient Hospital Stay (HOSPITAL_COMMUNITY)
Admission: EM | Admit: 2018-07-17 | Discharge: 2018-08-02 | DRG: 296 | Disposition: E | Payer: Medicare HMO | Attending: Pulmonary Disease | Admitting: Pulmonary Disease

## 2018-07-17 ENCOUNTER — Emergency Department (HOSPITAL_COMMUNITY): Payer: Medicare HMO

## 2018-07-17 DIAGNOSIS — R7989 Other specified abnormal findings of blood chemistry: Secondary | ICD-10-CM | POA: Diagnosis not present

## 2018-07-17 DIAGNOSIS — I499 Cardiac arrhythmia, unspecified: Secondary | ICD-10-CM | POA: Diagnosis not present

## 2018-07-17 DIAGNOSIS — Z8042 Family history of malignant neoplasm of prostate: Secondary | ICD-10-CM | POA: Diagnosis not present

## 2018-07-17 DIAGNOSIS — E8729 Other acidosis: Secondary | ICD-10-CM

## 2018-07-17 DIAGNOSIS — K219 Gastro-esophageal reflux disease without esophagitis: Secondary | ICD-10-CM | POA: Diagnosis present

## 2018-07-17 DIAGNOSIS — E872 Acidosis, unspecified: Secondary | ICD-10-CM

## 2018-07-17 DIAGNOSIS — J9811 Atelectasis: Secondary | ICD-10-CM | POA: Diagnosis not present

## 2018-07-17 DIAGNOSIS — I2694 Multiple subsegmental pulmonary emboli without acute cor pulmonale: Secondary | ICD-10-CM | POA: Diagnosis present

## 2018-07-17 DIAGNOSIS — N179 Acute kidney failure, unspecified: Secondary | ICD-10-CM | POA: Diagnosis present

## 2018-07-17 DIAGNOSIS — R778 Other specified abnormalities of plasma proteins: Secondary | ICD-10-CM

## 2018-07-17 DIAGNOSIS — Z20828 Contact with and (suspected) exposure to other viral communicable diseases: Secondary | ICD-10-CM | POA: Diagnosis present

## 2018-07-17 DIAGNOSIS — Z8507 Personal history of malignant neoplasm of pancreas: Secondary | ICD-10-CM

## 2018-07-17 DIAGNOSIS — D649 Anemia, unspecified: Secondary | ICD-10-CM | POA: Diagnosis present

## 2018-07-17 DIAGNOSIS — R636 Underweight: Secondary | ICD-10-CM | POA: Diagnosis present

## 2018-07-17 DIAGNOSIS — Z9842 Cataract extraction status, left eye: Secondary | ICD-10-CM

## 2018-07-17 DIAGNOSIS — R0602 Shortness of breath: Secondary | ICD-10-CM | POA: Diagnosis not present

## 2018-07-17 DIAGNOSIS — F17210 Nicotine dependence, cigarettes, uncomplicated: Secondary | ICD-10-CM | POA: Diagnosis present

## 2018-07-17 DIAGNOSIS — E874 Mixed disorder of acid-base balance: Secondary | ICD-10-CM | POA: Diagnosis present

## 2018-07-17 DIAGNOSIS — Z6821 Body mass index (BMI) 21.0-21.9, adult: Secondary | ICD-10-CM | POA: Diagnosis not present

## 2018-07-17 DIAGNOSIS — I4891 Unspecified atrial fibrillation: Secondary | ICD-10-CM | POA: Diagnosis present

## 2018-07-17 DIAGNOSIS — R0689 Other abnormalities of breathing: Secondary | ICD-10-CM | POA: Diagnosis not present

## 2018-07-17 DIAGNOSIS — J9600 Acute respiratory failure, unspecified whether with hypoxia or hypercapnia: Secondary | ICD-10-CM

## 2018-07-17 DIAGNOSIS — Z79899 Other long term (current) drug therapy: Secondary | ICD-10-CM

## 2018-07-17 DIAGNOSIS — I10 Essential (primary) hypertension: Secondary | ICD-10-CM | POA: Diagnosis present

## 2018-07-17 DIAGNOSIS — Z9079 Acquired absence of other genital organ(s): Secondary | ICD-10-CM | POA: Diagnosis not present

## 2018-07-17 DIAGNOSIS — Z8349 Family history of other endocrine, nutritional and metabolic diseases: Secondary | ICD-10-CM

## 2018-07-17 DIAGNOSIS — Z923 Personal history of irradiation: Secondary | ICD-10-CM | POA: Diagnosis not present

## 2018-07-17 DIAGNOSIS — G936 Cerebral edema: Secondary | ICD-10-CM | POA: Diagnosis present

## 2018-07-17 DIAGNOSIS — Z8546 Personal history of malignant neoplasm of prostate: Secondary | ICD-10-CM | POA: Diagnosis not present

## 2018-07-17 DIAGNOSIS — J9601 Acute respiratory failure with hypoxia: Secondary | ICD-10-CM | POA: Diagnosis not present

## 2018-07-17 DIAGNOSIS — I469 Cardiac arrest, cause unspecified: Principal | ICD-10-CM

## 2018-07-17 DIAGNOSIS — R404 Transient alteration of awareness: Secondary | ICD-10-CM | POA: Diagnosis not present

## 2018-07-17 DIAGNOSIS — R319 Hematuria, unspecified: Secondary | ICD-10-CM | POA: Diagnosis present

## 2018-07-17 DIAGNOSIS — J90 Pleural effusion, not elsewhere classified: Secondary | ICD-10-CM

## 2018-07-17 DIAGNOSIS — Z452 Encounter for adjustment and management of vascular access device: Secondary | ICD-10-CM | POA: Diagnosis not present

## 2018-07-17 DIAGNOSIS — Z66 Do not resuscitate: Secondary | ICD-10-CM | POA: Diagnosis present

## 2018-07-17 DIAGNOSIS — Z9841 Cataract extraction status, right eye: Secondary | ICD-10-CM

## 2018-07-17 DIAGNOSIS — R579 Shock, unspecified: Secondary | ICD-10-CM | POA: Diagnosis not present

## 2018-07-17 DIAGNOSIS — R402431 Glasgow coma scale score 3-8, in the field [EMT or ambulance]: Secondary | ICD-10-CM

## 2018-07-17 DIAGNOSIS — G931 Anoxic brain damage, not elsewhere classified: Secondary | ICD-10-CM | POA: Diagnosis not present

## 2018-07-17 DIAGNOSIS — Z792 Long term (current) use of antibiotics: Secondary | ICD-10-CM

## 2018-07-17 DIAGNOSIS — R4020 Unspecified coma: Secondary | ICD-10-CM | POA: Diagnosis not present

## 2018-07-17 LAB — COMPREHENSIVE METABOLIC PANEL
ALT: 49 U/L — ABNORMAL HIGH (ref 0–44)
AST: 107 U/L — ABNORMAL HIGH (ref 15–41)
Albumin: 2.8 g/dL — ABNORMAL LOW (ref 3.5–5.0)
Alkaline Phosphatase: 142 U/L — ABNORMAL HIGH (ref 38–126)
Anion gap: 16 — ABNORMAL HIGH (ref 5–15)
BUN: 11 mg/dL (ref 8–23)
CO2: 19 mmol/L — ABNORMAL LOW (ref 22–32)
Calcium: 8.8 mg/dL — ABNORMAL LOW (ref 8.9–10.3)
Chloride: 104 mmol/L (ref 98–111)
Creatinine, Ser: 1.17 mg/dL (ref 0.61–1.24)
GFR calc Af Amer: >60 mL/min (ref >60)
GFR calc non Af Amer: >60 mL/min (ref >60)
Glucose, Bld: 302 mg/dL — ABNORMAL HIGH (ref 70–99)
Potassium: 3.3 mmol/L — ABNORMAL LOW (ref 3.5–5.1)
Sodium: 139 mmol/L (ref 135–145)
Total Bilirubin: 0.6 mg/dL (ref 0.3–1.2)
Total Protein: 5.4 g/dL — ABNORMAL LOW (ref 6.5–8.1)

## 2018-07-17 LAB — POCT I-STAT 7, (LYTES, BLD GAS, ICA,H+H)
Acid-base deficit: 18 mmol/L — ABNORMAL HIGH (ref 0.0–2.0)
Bicarbonate: 15.8 mmol/L — ABNORMAL LOW (ref 20.0–28.0)
Calcium, Ion: 1.63 mmol/L (ref 1.15–1.40)
HCT: 24 % — ABNORMAL LOW (ref 39.0–52.0)
Hemoglobin: 8.2 g/dL — ABNORMAL LOW (ref 13.0–17.0)
O2 Saturation: 94 %
Patient temperature: 98.6
Potassium: 3.8 mmol/L (ref 3.5–5.1)
Sodium: 138 mmol/L (ref 135–145)
TCO2: 19 mmol/L — ABNORMAL LOW (ref 22–32)
pCO2 arterial: 94.8 mmHg (ref 32.0–48.0)
pH, Arterial: 6.831 — CL (ref 7.350–7.450)
pO2, Arterial: 131 mmHg — ABNORMAL HIGH (ref 83.0–108.0)

## 2018-07-17 LAB — CBC WITH DIFFERENTIAL/PLATELET
Abs Immature Granulocytes: 0.83 10*3/uL — ABNORMAL HIGH (ref 0.00–0.07)
Basophils Absolute: 0.1 10*3/uL (ref 0.0–0.1)
Basophils Relative: 1 %
Eosinophils Absolute: 0.1 10*3/uL (ref 0.0–0.5)
Eosinophils Relative: 1 %
HCT: 34.6 % — ABNORMAL LOW (ref 39.0–52.0)
Hemoglobin: 10.3 g/dL — ABNORMAL LOW (ref 13.0–17.0)
Immature Granulocytes: 8 %
Lymphocytes Relative: 57 %
Lymphs Abs: 5.7 10*3/uL — ABNORMAL HIGH (ref 0.7–4.0)
MCH: 30.9 pg (ref 26.0–34.0)
MCHC: 29.8 g/dL — ABNORMAL LOW (ref 30.0–36.0)
MCV: 103.9 fL — ABNORMAL HIGH (ref 80.0–100.0)
Monocytes Absolute: 0.3 10*3/uL (ref 0.1–1.0)
Monocytes Relative: 3 %
Neutro Abs: 3 10*3/uL (ref 1.7–7.7)
Neutrophils Relative %: 30 %
Platelets: 143 10*3/uL — ABNORMAL LOW (ref 150–400)
RBC: 3.33 MIL/uL — ABNORMAL LOW (ref 4.22–5.81)
RDW: 14.6 % (ref 11.5–15.5)
WBC: 9.9 10*3/uL (ref 4.0–10.5)
nRBC: 0.9 % — ABNORMAL HIGH (ref 0.0–0.2)

## 2018-07-17 LAB — PHOSPHORUS: Phosphorus: 8.7 mg/dL — ABNORMAL HIGH (ref 2.5–4.6)

## 2018-07-17 LAB — MAGNESIUM: Magnesium: 2.6 mg/dL — ABNORMAL HIGH (ref 1.7–2.4)

## 2018-07-17 LAB — LACTIC ACID, PLASMA: Lactic Acid, Venous: 10.6 mmol/L (ref 0.5–1.9)

## 2018-07-17 LAB — TROPONIN I: Troponin I: 0.06 ng/mL (ref <0.03)

## 2018-07-17 MED ORDER — SODIUM BICARBONATE 8.4 % IV SOLN
INTRAVENOUS | Status: AC | PRN
  Administered 2018-07-17 (×2): 50 meq via INTRAVENOUS

## 2018-07-17 MED ORDER — NOREPINEPHRINE 4 MG/250ML-% IV SOLN
0.0000 ug/min | INTRAVENOUS | Status: DC
  Administered 2018-07-17: 23:00:00 20 ug/min via INTRAVENOUS
  Administered 2018-07-18: 38 ug/min via INTRAVENOUS
  Filled 2018-07-17: qty 250

## 2018-07-17 MED ORDER — CALCIUM CHLORIDE 10 % IV SOLN
INTRAVENOUS | Status: AC | PRN
  Administered 2018-07-17: 14 meq via INTRAVENOUS

## 2018-07-17 MED ORDER — VANCOMYCIN HCL 10 G IV SOLR
1500.0000 mg | Freq: Once | INTRAVENOUS | Status: AC
  Administered 2018-07-18: 01:00:00 1500 mg via INTRAVENOUS
  Filled 2018-07-17: qty 1500

## 2018-07-17 MED ORDER — SODIUM CHLORIDE 0.9 % IV BOLUS (SEPSIS)
1000.0000 mL | Freq: Once | INTRAVENOUS | Status: AC
  Administered 2018-07-17: 23:00:00 1000 mL via INTRAVENOUS

## 2018-07-17 MED ORDER — PIPERACILLIN-TAZOBACTAM 3.375 G IVPB 30 MIN
3.3750 g | Freq: Once | INTRAVENOUS | Status: AC
  Administered 2018-07-18: 01:00:00 3.375 g via INTRAVENOUS
  Filled 2018-07-17: qty 50

## 2018-07-17 MED ORDER — NOREPINEPHRINE BITARTRATE 1 MG/ML IV SOLN
INTRAVENOUS | Status: AC | PRN
  Administered 2018-07-17: 10 ug via INTRAVENOUS
  Administered 2018-07-17: 15 ug via INTRAVENOUS

## 2018-07-17 MED ORDER — SODIUM CHLORIDE 0.9 % IV SOLN
1000.0000 mL | INTRAVENOUS | Status: DC
  Administered 2018-07-17: 1000 mL via INTRAVENOUS

## 2018-07-17 MED ORDER — NOREPINEPHRINE 4 MG/250ML-% IV SOLN
INTRAVENOUS | Status: AC
  Filled 2018-07-17: qty 250

## 2018-07-17 MED ORDER — EPINEPHRINE 1 MG/10ML IJ SOSY
PREFILLED_SYRINGE | INTRAMUSCULAR | Status: AC | PRN
  Administered 2018-07-17: 1 mg via INTRAVENOUS
  Administered 2018-07-17: 1 via INTRAVENOUS

## 2018-07-17 MED ORDER — EPINEPHRINE 1 MG/10ML IJ SOSY
PREFILLED_SYRINGE | INTRAMUSCULAR | Status: AC | PRN
  Administered 2018-07-17: 1 mg via INTRAVENOUS

## 2018-07-17 NOTE — Code Documentation (Signed)
CPR initiated

## 2018-07-17 NOTE — ED Provider Notes (Signed)
[facility] EMERGENCY DEPARTMENT Provider Note   CSN: [id number] Arrival date & time: 07/14/2018  2215    History   Chief Complaint Chief Complaint  Patient presents with  . Cardiac Arrest    HPI Perry Robinson is a 68 y.o. male.     HPI   68 year old male with a history of prior pancreatic cancer, prostate cancer, hypertension, history of hematuria over the last month followed by urology, presents with concern for postarrest.  Per family, patient has had hematuria over the last month, has undergone significant work-up with urologist, Dr. Jeffie Pollock, without etiology found. Wife reports he has had fatigue for the last week but no other focal symptoms. No cough, no fever, no vomiting, no dysuria, no chest pain. Has regular bowels but did not have diarrhea until today had one episode of stool incontinence.  He reported fatigue over the last month. Tonight, he had said he was short of breath, and then said that it hurts so bad and laid on his side.  His wife is not sure where he was having pain.  That was the last thing he said before he became unresponsive.  On EMS arrival, he initially had a pulse with a sinus arrhythmia, however while they were in his home, he lost pulses.  He was in PEA arrest.  They began CPR.  This occurred at 2119.  Per EMS, he had been in and out of PEA arrest during their transport over the last hour.  They report he last had ROSC at 2155 then lost pulses again. They felt that he had pulses when they pulled up to the bridge but were continuing CPR on arrival.  A king airway was placed. He has GCS of 3.   Past Medical History:  Diagnosis Date  . Cancer of pancreas, body ([place]) 08/2015  . Cataract   . Diverticulosis   . GERD (gastroesophageal reflux disease)   . Hypertension    patient denies  . Inguinal hernia   . Prostate cancer ([place]) 07/11/2004    Patient Active Problem List   Diagnosis Date Noted  . Genetic testing 12/27/2015  . Acquired  gastric fistula 12/21/2015  . Melena   . Protein-calorie malnutrition, severe ([place])   . Acute blood loss anemia   . Hematemesis 12/05/2015  . Family history of prostate cancer 09/12/2015  . Carcinoma of body of pancreas ([place]) 08/27/2015  . Cancer of pancreas, body ([place]) 06/28/2015  . History of cervical discectomy 05/29/2015  . Diverticulosis of colon 06/09/2013  . Back pain with left-sided radiculopathy 04/13/2013  . Essential hypertension   . Prostate cancer [facility])     Past Surgical History:  Procedure Laterality Date  . CATARACT EXTRACTION Bilateral 2011  . CYSTOSCOPY W/ URETERAL STENT PLACEMENT Bilateral 10/09/2016   Procedure: CYSTOSCOPY WITH BILATERAL  RETROGRADE AND URETERAL STENT PLACEMENT, TURBT;  Surgeon: Irine Seal, MD;  Location: [facility] ORS;  Service: Urology;  Laterality: Bilateral;  . EUS N/A 06/28/2015   Procedure: UPPER ENDOSCOPIC ULTRASOUND (EUS) LINEAR;  Surgeon: [name], MD;  Location: [facility] ENDOSCOPY;  Service: Endoscopy;  Laterality: N/A;  spoke with patient about new check in/procedure times JB  . EYE SURGERY    . FINE NEEDLE ASPIRATION  06/28/2015   Procedure: FINE NEEDLE ASPIRATION;  Surgeon: [name], MD;  Location: [facility] ENDOSCOPY;  Service: Endoscopy;;  . INGUINAL HERNIA REPAIR Right 08/27/2015   Procedure: OPEN RIGHT INGUINAL HERNIA ;  Surgeon: Ralene Ok, MD;  Location: [facility]  OR;  Service: General;  Laterality: Right;  . INSERTION OF MESH Right 08/27/2015   Procedure: INSERTION OF MESH;  Surgeon: Ralene Ok, MD;  Location: [facility];  Service: General;  Laterality: Right;  . LAPAROSCOPY N/A 08/27/2015   Procedure: LAPAROSCOPY DIAGNOSTIC;  Surgeon: Stark Klein, MD;  Location: [facility];  Service: General;  Laterality: N/A;  . PROSTATECTOMY  08/2004   2nd to CA  . ROTATOR CUFF REPAIR Bilateral 10/2008   11,10        Home Medications    Prior to Admission medications   Medication Sig Start Date End Date Taking? Authorizing Provider  cephALEXin  (KEFLEX) 500 MG capsule Take 1 capsule (500 mg total) by mouth 4 (four) times daily. 11/03/16   Lacretia Leigh, MD  HYDROcodone-acetaminophen (NORCO) 5-325 MG tablet Take 1 tablet by mouth every 6 (six) hours as needed for moderate pain. Patient not taking: Reported on 11/03/2016 10/09/16   Irine Seal, MD  oxybutynin (DITROPAN) 5 MG tablet Take 1 tablet (5 mg total) by mouth 3 (three) times daily as needed for bladder spasms. 10/09/16   Irine Seal, MD  tamsulosin (FLOMAX) 0.4 MG CAPS capsule TK 1 C PO QD 11/05/16   [provider]    Family History Family History  Problem Relation Age of Onset  . Thyroid disease Mother   . Prostate cancer Father   . Prostate cancer Brother        dx. late 1s; treated with stem cell therapy  . Prostate cancer Maternal Grandfather        d. middle ages  . Colon cancer Neg Hx     Social History Social History   Tobacco Use  . Smoking status: Current Some Day Smoker    Packs/day: 0.25    Years: 10.00    Pack years: 2.50    Types: Cigarettes  . Smokeless tobacco: Never Used  Substance Use Topics  . Alcohol use: Yes    Comment: "three sips of beer a day"  . Drug use: No     Allergies   Patient has no known allergies.   Review of Systems Review of Systems  Unable to perform ROS: Patient unresponsive     Physical Exam Updated Vital Signs BP 101/76   Pulse (!) 106   Temp (!) 95.3 F (35.2 C) (Rectal)   Resp (!) 26   Ht 5\' 11"  (1.803 m)   Wt 70.7 kg   SpO2 100%   BMI 21.74 kg/m   Physical Exam Vitals signs and nursing note reviewed.  Constitutional:      Appearance: He is well-developed and underweight. He is ill-appearing.     Comments: King airway in place being bagged on arrival   Eyes:     Pupils: Pupils are equal.     Right eye: Pupil is not reactive.     Left eye: Pupil is not reactive.  Cardiovascular:     Rate and Rhythm: Tachycardia present.  Pulmonary:     Breath sounds: Rhonchi present.  Abdominal:      General: Abdomen is flat.  Neurological:     GCS: GCS eye subscore is 1. GCS verbal subscore is 1. GCS motor subscore is 1.      ED Treatments / Results  Labs (all labs ordered are listed, but only abnormal results are displayed) Labs Reviewed  CBC WITH DIFFERENTIAL/PLATELET - Abnormal; Notable for the following components:      Result Value   RBC 3.33 (*)  Hemoglobin 10.3 (*)    HCT 34.6 (*)    MCV 103.9 (*)    MCHC 29.8 (*)    Platelets 143 (*)    nRBC 0.9 (*)    Lymphs Abs 5.7 (*)    Abs Immature Granulocytes 0.83 (*)    All other components within normal limits  COMPREHENSIVE METABOLIC PANEL - Abnormal; Notable for the following components:   Potassium 3.3 (*)    CO2 19 (*)    Glucose, Bld 302 (*)    Calcium 8.8 (*)    Total Protein 5.4 (*)    Albumin 2.8 (*)    AST 107 (*)    ALT 49 (*)    Alkaline Phosphatase 142 (*)    Anion gap 16 (*)    All other components within normal limits  LACTIC ACID, PLASMA - Abnormal; Notable for the following components:   Lactic Acid, Venous 10.6 (*)    All other components within normal limits  TROPONIN I - Abnormal; Notable for the following components:   Troponin I 0.06 (*)    All other components within normal limits  PHOSPHORUS - Abnormal; Notable for the following components:   Phosphorus 8.7 (*)    All other components within normal limits  MAGNESIUM - Abnormal; Notable for the following components:   Magnesium 2.6 (*)    All other components within normal limits  POCT I-STAT 7, (LYTES, BLD GAS, ICA,H+H) - Abnormal; Notable for the following components:   pH, Arterial 6.831 (*)    pCO2 arterial 94.8 (*)    pO2, Arterial 131.0 (*)    Bicarbonate 15.8 (*)    TCO2 19 (*)    Acid-base deficit 18.0 (*)    Calcium, Ion 1.63 (*)    HCT 24.0 (*)    Hemoglobin 8.2 (*)    All other components within normal limits  POCT I-STAT 7, (LYTES, BLD GAS, ICA,H+H) - Abnormal; Notable for the following components:   pH, Arterial  7.059 (*)    pCO2 arterial 51.4 (*)    pO2, Arterial 78.0 (*)    Bicarbonate 14.5 (*)    TCO2 16 (*)    Acid-base deficit 15.0 (*)    HCT 23.0 (*)    Hemoglobin 7.8 (*)    All other components within normal limits  CULTURE, BLOOD (ROUTINE X 2)  CULTURE, BLOOD (ROUTINE X 2)  SARS CORONAVIRUS 2 (HOSPITAL ORDER, [facility])  BRAIN NATRIURETIC PEPTIDE  LACTIC ACID, PLASMA  I-STAT ARTERIAL BLOOD GAS, ED  TYPE AND SCREEN    EKG EKG Interpretation  Date/Time:  Saturday Jul 17 2018 22:30:20 EDT Ventricular Rate:  129 PR Interval:    QRS Duration: 81 QT Interval:  316 QTC Calculation: 463 R Axis:   70 Text Interpretation:  Atrial fibrillation Ventricular premature complex Low voltage, extremity leads ST depr, consider ischemia, anterolateral lds ST depressions and atrial fibrillation new from prior  Confirmed by Gareth Morgan [phone]) on 10-Aug-2018 1:16:20 AM   Radiology Dg Chest Portable 1 View  Result Date: 10-Aug-2018 CLINICAL DATA:  Intubation EXAM: PORTABLE CHEST 1 VIEW COMPARISON:  12/05/2015 FINDINGS: Endotracheal tube is positioned with tip over the mid trachea. Esophagogastric tube with tip near the gastroesophageal junction and side port above the diaphragm. There is a moderate to large right pleural effusion with associated atelectasis or consolidation. The left lung is normally aerated. IMPRESSION: 1.  Endotracheal tube is positioned with tip over the mid trachea. 2. Esophagogastric tube with tip  near the gastroesophageal junction and side port above the diaphragm. Recommend repositioning and advancement to ensure subdiaphragmatic positioning. 3. There is a moderate to large right pleural effusion with associated atelectasis or consolidation. The left lung is normally aerated. Electronically Signed   By: Eddie Candle M.D.   On: Aug 14, 2018 00:00    Procedures .Critical Care Performed by: Gareth Morgan, MD Authorized by: Gareth Morgan, MD    Critical care provider statement:    Critical care time (minutes):  120   Critical care time was exclusive of:  Separately billable procedures and treating other patients and teaching time   Critical care was necessary to treat or prevent imminent or life-threatening deterioration of the following conditions:  Cardiac failure, respiratory failure, shock and CNS failure or compromise   Critical care was time spent personally by me on the following activities:  Discussions with consultants, evaluation of patient's response to treatment, examination of patient, ordering and performing treatments and interventions, ordering and review of laboratory studies, ordering and review of radiographic studies, pulse oximetry, re-evaluation of patient's condition, obtaining history from patient or surrogate, review of old charts and ventilator management   (including critical care time)   Cardiopulmonary Resuscitation (CPR) Procedure Note Directed/Performed by: Tennis Must I personally directed ancillary staff and/or performed CPR in an effort to regain return of spontaneous circulation and to maintain cardiac, neuro and systemic perfusion.      Medications Ordered in ED Medications  sodium chloride 0.9 % bolus 1,000 mL (0 mLs Intravenous Stopped 07/16/2018 2355)    Followed by  sodium chloride 0.9 % bolus 1,000 mL (0 mLs Intravenous Stopped 08/14/2018 0019)    Followed by  0.9 %  sodium chloride infusion (1,000 mLs Intravenous New Bag/Given 07/16/2018 2355)  norepinephrine (LEVOPHED) 4mg  in 265mL premix infusion (38 mcg/min Intravenous Rate/Dose Verify 14-Aug-2018 0115)  vancomycin (VANCOCIN) 1,500 mg in sodium chloride 0.9 % 500 mL IVPB (1,500 mg Intravenous New Bag/Given 14-Aug-2018 0117)  EPINEPHrine (ADRENALIN) 1 MG/10ML injection (1 mg Intravenous Given 08/01/2018 2238)  norepinephrine (LEVOPHED) injection (15 mcg Intravenous Given 07/23/2018 2244)  EPINEPHrine (ADRENALIN) 1 MG/10ML injection (1 mg Intravenous  Given 07/04/2018 2300)  sodium bicarbonate injection (50 mEq Intravenous Given 07/22/2018 2322)  calcium chloride injection (14 mEq Intravenous Given 07/27/2018 2259)  piperacillin-tazobactam (ZOSYN) IVPB 3.375 g (0 g Intravenous Stopped 2018-08-14 0100)     Initial Impression / Assessment and Plan / ED Course  I have reviewed the triage vital signs and the nursing notes.  Pertinent labs & imaging results that were available during my care of the patient were reviewed by me and considered in my medical decision making (see chart for details).        68 year old male with a history of prior pancreatic cancer, prostate cancer, hypertension, history of hematuria over the last month followed by urology, presents with concern for postarrest after reporting shortness of breath and "pain" of unknown location.  Patient had report of ROSC just prior to arrival to ED and was receiving post-ROSC compressions on arrival to ED. On my initial pulse check felt pulse however this was quickly lost on arrival and CPR was reinitiated.    Had PEA arrest with ROSC.  Patient hypotensive and started on levophed however again had PEA arrest with ROSC after CPR, epinephrine.   Unclear cause of arrest at this time by history, consider PE, ACS, COPD/hypoxia/pneumonia, sepsis/COVID.   Given abx, CT ordered. Trop .06, will continue to follow.   Patient with continued  shock, on levophed. Given IV bicarb.  Combined respiratory and metabolic acidosis, RR increased. Central line placed by Doneta Public MD with Dr. Rex Kras MD supervising.  I supervised intubation by Doneta Public MD.   Discussed with family concern for poor neurologic outcome given prolonged CPR.  At this time he is full code however they are continuing to discuss his wishes.   Final Clinical Impressions(s) / ED Diagnoses   Final diagnoses:  Cardiac arrest ([place])  PEA (Pulseless electrical activity) ([place])  Lactic acidosis  Respiratory acidosis  Elevated troponin   Pleural effusion  Glasgow coma scale total score 3-8, in the field (EMT or ambulance) [facility])    ED Discharge Orders    None       Gareth Morgan, MD 07/24/2018 0121

## 2018-07-17 NOTE — Code Documentation (Signed)
Pt has ROSC, pulses strong

## 2018-07-17 NOTE — Code Documentation (Signed)
Pt had ROSC, pulses strong

## 2018-07-17 NOTE — ED Triage Notes (Signed)
Pt brought in by [facility], Pt reports SHOB to wife. Pt has been having hematuria and incontinence x 1 month. Pt had agonal respirations upon EMS arrival. EMS found pt to be bradycardic. EMS began CPR at 2219. Pt received  9 epi's en route. Pt had ROSC 4 times with EMS. EMS last began CPR at 2155. Pt had weak pulses at 1019. CPR continued by staff. 1 epi given here at 1021. Pulse check done at 1024, pt had ROSC at 1024

## 2018-07-17 NOTE — ED Notes (Signed)
MD at bedside putting in [facility].

## 2018-07-17 NOTE — ED Notes (Signed)
Resident at bedside preparing for central line placement

## 2018-07-18 ENCOUNTER — Inpatient Hospital Stay (HOSPITAL_COMMUNITY): Payer: Medicare HMO

## 2018-07-18 ENCOUNTER — Emergency Department (HOSPITAL_COMMUNITY): Payer: Medicare HMO

## 2018-07-18 DIAGNOSIS — E872 Acidosis, unspecified: Secondary | ICD-10-CM

## 2018-07-18 DIAGNOSIS — I462 Cardiac arrest due to underlying cardiac condition: Secondary | ICD-10-CM | POA: Diagnosis not present

## 2018-07-18 DIAGNOSIS — I469 Cardiac arrest, cause unspecified: Secondary | ICD-10-CM | POA: Diagnosis not present

## 2018-07-18 DIAGNOSIS — G936 Cerebral edema: Secondary | ICD-10-CM | POA: Diagnosis not present

## 2018-07-18 DIAGNOSIS — Z6821 Body mass index (BMI) 21.0-21.9, adult: Secondary | ICD-10-CM | POA: Diagnosis not present

## 2018-07-18 DIAGNOSIS — Z20828 Contact with and (suspected) exposure to other viral communicable diseases: Secondary | ICD-10-CM | POA: Diagnosis not present

## 2018-07-18 DIAGNOSIS — I2694 Multiple subsegmental pulmonary emboli without acute cor pulmonale: Secondary | ICD-10-CM | POA: Diagnosis not present

## 2018-07-18 DIAGNOSIS — D649 Anemia, unspecified: Secondary | ICD-10-CM | POA: Diagnosis present

## 2018-07-18 DIAGNOSIS — N179 Acute kidney failure, unspecified: Secondary | ICD-10-CM | POA: Diagnosis not present

## 2018-07-18 DIAGNOSIS — Z66 Do not resuscitate: Secondary | ICD-10-CM | POA: Diagnosis not present

## 2018-07-18 DIAGNOSIS — R402431 Glasgow coma scale score 3-8, in the field [EMT or ambulance]: Secondary | ICD-10-CM | POA: Diagnosis present

## 2018-07-18 DIAGNOSIS — J9601 Acute respiratory failure with hypoxia: Secondary | ICD-10-CM | POA: Diagnosis not present

## 2018-07-18 DIAGNOSIS — R579 Shock, unspecified: Secondary | ICD-10-CM | POA: Diagnosis not present

## 2018-07-18 DIAGNOSIS — I10 Essential (primary) hypertension: Secondary | ICD-10-CM | POA: Diagnosis present

## 2018-07-18 DIAGNOSIS — Z452 Encounter for adjustment and management of vascular access device: Secondary | ICD-10-CM | POA: Diagnosis not present

## 2018-07-18 DIAGNOSIS — K219 Gastro-esophageal reflux disease without esophagitis: Secondary | ICD-10-CM | POA: Diagnosis present

## 2018-07-18 DIAGNOSIS — G931 Anoxic brain damage, not elsewhere classified: Secondary | ICD-10-CM | POA: Diagnosis not present

## 2018-07-18 DIAGNOSIS — Z9079 Acquired absence of other genital organ(s): Secondary | ICD-10-CM | POA: Diagnosis not present

## 2018-07-18 DIAGNOSIS — Z8042 Family history of malignant neoplasm of prostate: Secondary | ICD-10-CM | POA: Diagnosis not present

## 2018-07-18 DIAGNOSIS — R778 Other specified abnormalities of plasma proteins: Secondary | ICD-10-CM

## 2018-07-18 DIAGNOSIS — J90 Pleural effusion, not elsewhere classified: Secondary | ICD-10-CM

## 2018-07-18 DIAGNOSIS — Z923 Personal history of irradiation: Secondary | ICD-10-CM | POA: Diagnosis not present

## 2018-07-18 DIAGNOSIS — R636 Underweight: Secondary | ICD-10-CM | POA: Diagnosis present

## 2018-07-18 DIAGNOSIS — R319 Hematuria, unspecified: Secondary | ICD-10-CM | POA: Diagnosis present

## 2018-07-18 DIAGNOSIS — F17210 Nicotine dependence, cigarettes, uncomplicated: Secondary | ICD-10-CM | POA: Diagnosis present

## 2018-07-18 DIAGNOSIS — R0602 Shortness of breath: Secondary | ICD-10-CM | POA: Diagnosis not present

## 2018-07-18 DIAGNOSIS — E874 Mixed disorder of acid-base balance: Secondary | ICD-10-CM | POA: Diagnosis present

## 2018-07-18 DIAGNOSIS — Z8546 Personal history of malignant neoplasm of prostate: Secondary | ICD-10-CM | POA: Diagnosis not present

## 2018-07-18 DIAGNOSIS — I4891 Unspecified atrial fibrillation: Secondary | ICD-10-CM | POA: Diagnosis present

## 2018-07-18 LAB — COMPREHENSIVE METABOLIC PANEL
ALT: 112 U/L — ABNORMAL HIGH (ref 0–44)
AST: 328 U/L — ABNORMAL HIGH (ref 15–41)
Albumin: 2 g/dL — ABNORMAL LOW (ref 3.5–5.0)
Alkaline Phosphatase: 214 U/L — ABNORMAL HIGH (ref 38–126)
Anion gap: 16 — ABNORMAL HIGH (ref 5–15)
BUN: 12 mg/dL (ref 8–23)
CO2: 16 mmol/L — ABNORMAL LOW (ref 22–32)
Calcium: 7.9 mg/dL — ABNORMAL LOW (ref 8.9–10.3)
Chloride: 111 mmol/L (ref 98–111)
Creatinine, Ser: 1.16 mg/dL (ref 0.61–1.24)
GFR calc Af Amer: >60 mL/min (ref >60)
GFR calc non Af Amer: >60 mL/min (ref >60)
Glucose, Bld: 183 mg/dL — ABNORMAL HIGH (ref 70–99)
Potassium: 4.1 mmol/L (ref 3.5–5.1)
Sodium: 143 mmol/L (ref 135–145)
Total Bilirubin: 0.8 mg/dL (ref 0.3–1.2)
Total Protein: 4 g/dL — ABNORMAL LOW (ref 6.5–8.1)

## 2018-07-18 LAB — PREPARE RBC (CROSSMATCH)

## 2018-07-18 LAB — HIV ANTIBODY (ROUTINE TESTING W REFLEX): HIV Screen 4th Generation wRfx: NONREACTIVE

## 2018-07-18 LAB — POCT I-STAT 7, (LYTES, BLD GAS, ICA,H+H)
Acid-base deficit: 14 mmol/L — ABNORMAL HIGH (ref 0.0–2.0)
Acid-base deficit: 15 mmol/L — ABNORMAL HIGH (ref 0.0–2.0)
Acid-base deficit: 16 mmol/L — ABNORMAL HIGH (ref 0.0–2.0)
Bicarbonate: 12.6 mmol/L — ABNORMAL LOW (ref 20.0–28.0)
Bicarbonate: 13 mmol/L — ABNORMAL LOW (ref 20.0–28.0)
Bicarbonate: 14.5 mmol/L — ABNORMAL LOW (ref 20.0–28.0)
Calcium, Ion: 1.14 mmol/L — ABNORMAL LOW (ref 1.15–1.40)
Calcium, Ion: 1.16 mmol/L (ref 1.15–1.40)
Calcium, Ion: 1.25 mmol/L (ref 1.15–1.40)
HCT: 19 % — ABNORMAL LOW (ref 39.0–52.0)
HCT: 23 % — ABNORMAL LOW (ref 39.0–52.0)
HCT: 23 % — ABNORMAL LOW (ref 39.0–52.0)
Hemoglobin: 6.5 g/dL — CL (ref 13.0–17.0)
Hemoglobin: 7.8 g/dL — ABNORMAL LOW (ref 13.0–17.0)
Hemoglobin: 7.8 g/dL — ABNORMAL LOW (ref 13.0–17.0)
O2 Saturation: 88 %
O2 Saturation: 94 %
O2 Saturation: 98 %
Patient temperature: 32.7
Patient temperature: 92.6
Patient temperature: 98.6
Potassium: 4 mmol/L (ref 3.5–5.1)
Potassium: 4.2 mmol/L (ref 3.5–5.1)
Potassium: 4.4 mmol/L (ref 3.5–5.1)
Sodium: 140 mmol/L (ref 135–145)
Sodium: 142 mmol/L (ref 135–145)
Sodium: 142 mmol/L (ref 135–145)
TCO2: 14 mmol/L — ABNORMAL LOW (ref 22–32)
TCO2: 14 mmol/L — ABNORMAL LOW (ref 22–32)
TCO2: 16 mmol/L — ABNORMAL LOW (ref 22–32)
pCO2 arterial: 28.6 mmHg — ABNORMAL LOW (ref 32.0–48.0)
pCO2 arterial: 35.2 mmHg (ref 32.0–48.0)
pCO2 arterial: 51.4 mmHg — ABNORMAL HIGH (ref 32.0–48.0)
pH, Arterial: 7.059 — CL (ref 7.350–7.450)
pH, Arterial: 7.134 — CL (ref 7.350–7.450)
pH, Arterial: 7.245 — ABNORMAL LOW (ref 7.350–7.450)
pO2, Arterial: 100 mmHg (ref 83.0–108.0)
pO2, Arterial: 73 mmHg — ABNORMAL LOW (ref 83.0–108.0)
pO2, Arterial: 78 mmHg — ABNORMAL LOW (ref 83.0–108.0)

## 2018-07-18 LAB — PROTIME-INR
INR: 3.7 — ABNORMAL HIGH (ref 0.8–1.2)
Prothrombin Time: 36.3 seconds — ABNORMAL HIGH (ref 11.4–15.2)

## 2018-07-18 LAB — CBC WITH DIFFERENTIAL/PLATELET
Abs Immature Granulocytes: 0.34 10*3/uL — ABNORMAL HIGH (ref 0.00–0.07)
Basophils Absolute: 0.1 10*3/uL (ref 0.0–0.1)
Basophils Relative: 1 %
Eosinophils Absolute: 0.1 10*3/uL (ref 0.0–0.5)
Eosinophils Relative: 1 %
HCT: 27.4 % — ABNORMAL LOW (ref 39.0–52.0)
Hemoglobin: 8.3 g/dL — ABNORMAL LOW (ref 13.0–17.0)
Immature Granulocytes: 2 %
Lymphocytes Relative: 11 %
Lymphs Abs: 1.5 10*3/uL (ref 0.7–4.0)
MCH: 30.9 pg (ref 26.0–34.0)
MCHC: 30.3 g/dL (ref 30.0–36.0)
MCV: 101.9 fL — ABNORMAL HIGH (ref 80.0–100.0)
Monocytes Absolute: 0.1 10*3/uL (ref 0.1–1.0)
Monocytes Relative: 1 %
Neutro Abs: 11.9 10*3/uL — ABNORMAL HIGH (ref 1.7–7.7)
Neutrophils Relative %: 84 %
Platelets: 136 10*3/uL — ABNORMAL LOW (ref 150–400)
RBC: 2.69 MIL/uL — ABNORMAL LOW (ref 4.22–5.81)
RDW: 14.6 % (ref 11.5–15.5)
WBC: 14 10*3/uL — ABNORMAL HIGH (ref 4.0–10.5)
nRBC: 1.6 % — ABNORMAL HIGH (ref 0.0–0.2)

## 2018-07-18 LAB — URINALYSIS, ROUTINE W REFLEX MICROSCOPIC

## 2018-07-18 LAB — SARS CORONAVIRUS 2 BY RT PCR (HOSPITAL ORDER, PERFORMED IN ~~LOC~~ HOSPITAL LAB): SARS Coronavirus 2: NEGATIVE

## 2018-07-18 LAB — PHOSPHORUS: Phosphorus: 7.2 mg/dL — ABNORMAL HIGH (ref 2.5–4.6)

## 2018-07-18 LAB — BRAIN NATRIURETIC PEPTIDE: B Natriuretic Peptide: 96.4 pg/mL (ref 0.0–100.0)

## 2018-07-18 LAB — URINALYSIS, MICROSCOPIC (REFLEX): RBC / HPF: >50 RBC/hpf (ref 0–5)

## 2018-07-18 LAB — LACTIC ACID, PLASMA
Lactic Acid, Venous: 10.9 mmol/L (ref 0.5–1.9)
Lactic Acid, Venous: >11 mmol/L (ref 0.5–1.9)

## 2018-07-18 LAB — MRSA PCR SCREENING: MRSA by PCR: NEGATIVE

## 2018-07-18 LAB — MAGNESIUM: Magnesium: 2.1 mg/dL (ref 1.7–2.4)

## 2018-07-18 MED ORDER — SODIUM BICARBONATE 8.4 % IV SOLN
25.0000 meq | Freq: Once | INTRAVENOUS | Status: AC
  Administered 2018-07-18: 02:00:00 25 meq via INTRAVENOUS
  Filled 2018-07-18: qty 50

## 2018-07-18 MED ORDER — PHENYLEPHRINE HCL-NACL 40-0.9 MG/250ML-% IV SOLN
0.0000 ug/min | INTRAVENOUS | Status: DC
  Filled 2018-07-18: qty 250

## 2018-07-18 MED ORDER — SODIUM CHLORIDE 0.9% IV SOLUTION: Freq: Once | INTRAVENOUS | Status: DC

## 2018-07-18 MED ORDER — SODIUM BICARBONATE 8.4 % IV SOLN
INTRAVENOUS | Status: DC
  Administered 2018-07-18: 03:00:00 via INTRAVENOUS
  Filled 2018-07-18 (×3): qty 150

## 2018-07-18 MED ORDER — CHLORHEXIDINE GLUCONATE 0.12% ORAL RINSE (MEDLINE KIT)
15.0000 mL | Freq: Two times a day (BID) | OROMUCOSAL | Status: DC
  Administered 2018-07-18: 08:00:00 15 mL via OROMUCOSAL

## 2018-07-18 MED ORDER — SODIUM CHLORIDE 0.9% FLUSH: 10.0000 mL | INTRAVENOUS | Status: DC | PRN

## 2018-07-18 MED ORDER — ACETAMINOPHEN 325 MG PO TABS: 650.0000 mg | ORAL_TABLET | ORAL | Status: DC | PRN

## 2018-07-18 MED ORDER — PHENYLEPHRINE HCL-NACL 40-0.9 MG/250ML-% IV SOLN
0.0000 ug/min | INTRAVENOUS | Status: DC
  Administered 2018-07-18 (×3): 400 ug/min via INTRAVENOUS
  Filled 2018-07-18 (×4): qty 250

## 2018-07-18 MED ORDER — HEPARIN BOLUS VIA INFUSION
3000.0000 [IU] | Freq: Once | INTRAVENOUS | Status: AC
  Administered 2018-07-18: 06:00:00 3000 [IU] via INTRAVENOUS
  Filled 2018-07-18: qty 3000

## 2018-07-18 MED ORDER — SODIUM CHLORIDE 0.9 % IV SOLN: INTRAVENOUS | Status: DC | PRN

## 2018-07-18 MED ORDER — SODIUM BICARBONATE 8.4 % IV SOLN
INTRAVENOUS | Status: AC
  Filled 2018-07-18: qty 200

## 2018-07-18 MED ORDER — HEPARIN SODIUM (PORCINE) 5000 UNIT/ML IJ SOLN
5000.0000 [IU] | Freq: Three times a day (TID) | INTRAMUSCULAR | Status: DC
  Administered 2018-07-18: 05:00:00 5000 [IU] via SUBCUTANEOUS
  Filled 2018-07-18: qty 1

## 2018-07-18 MED ORDER — SODIUM CHLORIDE 0.9% FLUSH: 10.0000 mL | Freq: Two times a day (BID) | INTRAVENOUS | Status: DC

## 2018-07-18 MED ORDER — PANTOPRAZOLE SODIUM 40 MG IV SOLR: 40.0000 mg | Freq: Every day | INTRAVENOUS | Status: DC

## 2018-07-18 MED ORDER — PHENYLEPHRINE HCL-NACL 10-0.9 MG/250ML-% IV SOLN
0.0000 ug/min | INTRAVENOUS | Status: DC
  Administered 2018-07-18: 06:00:00 75 ug/min via INTRAVENOUS

## 2018-07-18 MED ORDER — EPINEPHRINE PF 1 MG/ML IJ SOLN
0.5000 ug/min | INTRAVENOUS | Status: DC
  Filled 2018-07-18: qty 4

## 2018-07-18 MED ORDER — ORAL CARE MOUTH RINSE
15.0000 mL | OROMUCOSAL | Status: DC
  Administered 2018-07-18 (×2): 15 mL via OROMUCOSAL

## 2018-07-18 MED ORDER — SODIUM BICARBONATE 8.4 % IV SOLN
200.0000 meq | Freq: Once | INTRAVENOUS | Status: AC
  Administered 2018-07-18: 06:00:00 200 meq via INTRAVENOUS

## 2018-07-18 MED ORDER — SODIUM CHLORIDE 0.9 % IV SOLN: 250.0000 mL | INTRAVENOUS | Status: DC

## 2018-07-18 MED ORDER — IOHEXOL 350 MG/ML SOLN
75.0000 mL | Freq: Once | INTRAVENOUS | Status: AC | PRN
  Administered 2018-07-18: 04:00:00 75 mL via INTRAVENOUS

## 2018-07-18 MED ORDER — CHLORHEXIDINE GLUCONATE CLOTH 2 % EX PADS: 6.0000 | MEDICATED_PAD | Freq: Every day | CUTANEOUS | Status: DC

## 2018-07-18 MED ORDER — HEPARIN (PORCINE) 25000 UT/250ML-% IV SOLN
1200.0000 [IU]/h | INTRAVENOUS | Status: DC
  Administered 2018-07-18: 06:00:00 1200 [IU]/h via INTRAVENOUS
  Filled 2018-07-18: qty 250

## 2018-07-18 MED ORDER — VASOPRESSIN 20 UNIT/ML IV SOLN
0.0300 [IU]/min | INTRAVENOUS | Status: DC
  Administered 2018-07-18: 03:00:00 0.03 [IU]/min via INTRAVENOUS
  Filled 2018-07-18: qty 2

## 2018-07-18 MED ORDER — SODIUM CHLORIDE 0.9 % IV BOLUS
1000.0000 mL | Freq: Once | INTRAVENOUS | Status: AC
  Administered 2018-07-18: 03:00:00 1000 mL via INTRAVENOUS

## 2018-07-18 MED ORDER — NOREPINEPHRINE 4 MG/250ML-% IV SOLN
INTRAVENOUS | Status: AC
  Administered 2018-07-18: 03:00:00 40 ug/min via INTRAVENOUS
  Filled 2018-07-18: qty 250

## 2018-07-18 MED ORDER — NOREPINEPHRINE 4 MG/250ML-% IV SOLN
5.0000 ug/min | INTRAVENOUS | Status: DC
  Administered 2018-07-18 (×4): 50 ug/min via INTRAVENOUS
  Administered 2018-07-18: 03:00:00 40 ug/min via INTRAVENOUS
  Administered 2018-07-18: 10:00:00 50 ug/min via INTRAVENOUS
  Administered 2018-07-18: 40 ug/min via INTRAVENOUS
  Filled 2018-07-18: qty 250
  Filled 2018-07-18: qty 500
  Filled 2018-07-18 (×3): qty 250

## 2018-07-18 MED ORDER — PHENYLEPHRINE HCL-NACL 10-0.9 MG/250ML-% IV SOLN
INTRAVENOUS | Status: AC
  Administered 2018-07-18: 06:00:00 125 mg
  Filled 2018-07-18: qty 250

## 2018-07-18 MED FILL — Medication: Qty: 1 | Status: AC

## 2018-07-19 LAB — GLUCOSE, CAPILLARY: Glucose-Capillary: 163 mg/dL — ABNORMAL HIGH (ref 70–99)

## 2018-07-20 ENCOUNTER — Telehealth: Payer: Self-pay | Admitting: *Deleted

## 2018-07-20 NOTE — Telephone Encounter (Signed)
Received original D/C form [facility], D/C forwarded to Dr.Wert (pulmonary) to sign for Dr.Mannam.

## 2018-07-21 LAB — TYPE AND SCREEN
ABO/RH(D): B POS
Antibody Screen: NEGATIVE
Unit division: 0
Unit division: 0

## 2018-07-21 LAB — BPAM RBC
Blood Product Expiration Date: 202005202359
Blood Product Expiration Date: 202005282359
ISSUE DATE / TIME: 202005192057
Unit Type and Rh: 1700
Unit Type and Rh: 7300

## 2018-07-22 NOTE — Telephone Encounter (Signed)
Received original signed D/C-[facility] notified for pick up.

## 2018-07-23 LAB — CULTURE, BLOOD (ROUTINE X 2)
Culture: NO GROWTH
Culture: NO GROWTH
Special Requests: ADEQUATE
Special Requests: ADEQUATE

## 2018-08-02 NOTE — ED Notes (Signed)
Family has been brought to the bedside, updated on the same. Radiology notified pt is ready when they are ready for pt to be transported for radiology testing

## 2018-08-02 NOTE — ED Notes (Signed)
[facility] notified for CCM request for abg

## 2018-08-02 NOTE — Discharge Summary (Signed)
Physician Death Summary  Patient ID: Perry Robinson MRN: [id number] DOB/AGE: [birth year] 68 y.o.  Admit date: 07/11/2018 Discharge date: 2018/07/28  Admission Diagnoses: Cardiac arrest  Discharge Diagnoses:  Respiratory failure Pulmonary embolism Pleural effusions Cardiac arrest Anoxic brain injury Cerebral edema  Discharged Condition: Deceased  Hospital Course: 68 yr old M w/ PMHx Pancreatic CA and Prostate CA, GERD, HTN and ongoing hematuria (workup by Dr Jeffie Pollock) presents s/p cardiac arrest, intubated on vasopressors, multiple arrests in ED and LA >10. [name] asked to admit  Patient was intermittently coded by EMS and ED for close to 2 hours before ultimately ROSC was achieved.  CTA of the chest showed subsegmental pulmonary embolism and a large right pleural effusion, findings suspicious for an underlying mass and mediastinal lymphadenopathy.  CT head showed cerebral edema concerning for anoxic brain injury  He was admitted to the ICU started on Vanco and Zosyn, bicarb drip and heparin.  He deteriorated with refractory shock requiring maximum doses of pressors including epinephrine, vasopressin, Levophed and Neo-Synephrine drips.  CODE STATUS was DNR per family discussion.  Shock was unresponsive to treatment and he passed away on Jul 28, 2022. Family was at bedside.  Signed: Khloe Hunkele 07/21/2018, 9:58 AM

## 2018-08-02 NOTE — ED Notes (Signed)
Provider re attempting central line in a different location, requested chaplin update the family

## 2018-08-02 NOTE — ED Provider Notes (Signed)
Procedure Name: Intubation Date/Time: 07/15/2018 10:30 PM Performed by: Doneta Public, MD Pre-anesthesia Checklist: Emergency Drugs available, Suction available and Patient being monitored Laryngoscope Size: Mac and 4 Grade View: Grade II Tube size: 7.5 mm Number of attempts: 1 Airway Equipment and Method: Video-laryngoscopy Placement Confirmation: ETT inserted through vocal cords under direct vision,  Positive ETCO2,  CO2 detector and Breath sounds checked- equal and bilateral Secured at: 23 cm Tube secured with: ETT holder         Doneta Public, MD 08/06/18 [id number]    Gareth Morgan, MD 07/20/18 0630

## 2018-08-02 NOTE — Progress Notes (Signed)
[name] progress note  Maxed out on pressors with MAPS in 30s to 47s Discussed with wife at bedside. Patient will not likely survive this admission Asked her to call the rest of the family to the bedside DNR status confirmed.  Marshell Garfinkel MD [facility] 07/25/18, 11:45 AM

## 2018-08-02 NOTE — Progress Notes (Signed)
Patient transported from ED Trauma C to CT and then to 7V66 with no complications.

## 2018-08-02 NOTE — Progress Notes (Signed)
   2018/08/06 0115  Clinical Encounter Type  Visited With Family  Visit Type Initial;Critical Care;ED  Referral From Nurse   Chaplain responded to a post-CPR in the ED. Chaplain extended hospitality. Chaplain assisted in facilitating medical consultation with the family. Spiritual care services available as needed.   Jeri Lager, Chaplain

## 2018-08-02 NOTE — ED Provider Notes (Signed)
.  Central Line Date/Time: 2018-08-10 12:55 AM Performed by: Doneta Public, MD Authorized by: Sharlett Iles, MD   Consent:    Consent obtained:  Emergent situation Pre-procedure details:    Skin preparation:  ChloraPrep   Skin preparation agent: Skin preparation agent completely dried prior to procedure   Procedure details:    Location:  L subclavian and R subclavian   Patient position:  Reverse Trendelenburg   Procedural supplies:  Triple lumen   Landmarks identified: yes     Number of attempts:  2   Successful placement: yes   Post-procedure details:    Post-procedure:  Dressing applied and line sutured   Assessment:  Blood return through all ports, no pneumothorax on x-ray, placement verified by x-ray and free fluid flow Comments:     Left subclavian converted to right subclavian after malfunction of the guidewire (sever kinking, unable to thread the triple lumen)      Doneta Public, MD August 10, 2018 0100    Rex Kras Wenda Overland, MD Aug 10, 2018 1115

## 2018-08-02 NOTE — ED Notes (Signed)
Patient transported to CT 

## 2018-08-02 NOTE — Progress Notes (Signed)
ANTICOAGULATION CONSULT NOTE - Initial Consult  Pharmacy Consult for heparin Indication: pulmonary embolus  No Known Allergies  Patient Measurements: Height: 5\' 11"  (180.3 cm) Weight: 165 lb 5.5 oz (75 kg) IBW/kg (Calculated) : 75.3 Heparin Dosing Weight: 75kg  Vital Signs: Temp: 90.9 F (32.7 C) (05/17 0445) Temp Source: Rectal (05/16 2310) BP: 96/76 (05/17 0445) Pulse Rate: 92 (05/17 0316)  Labs: Recent Labs    07/11/2018 2210 07/13/2018 2311 08-14-2018 0013 2018/08/14 0249  HGB 10.3* 8.2* 7.8* 8.3*  HCT 34.6* 24.0* 23.0* 27.4*  PLT 143*  --   --  136*  LABPROT  --   --   --  36.3*  INR  --   --   --  3.7*  CREATININE 1.17  --   --  1.16  TROPONINI 0.06*  --   --   --     Estimated Creatinine Clearance: 63.8 mL/min (by C-G formula based on SCr of 1.16 mg/dL).   Medical History: Past Medical History:  Diagnosis Date  . Cancer of pancreas, body ([place]) 08/2015  . Cataract   . Diverticulosis   . GERD (gastroesophageal reflux disease)   . Hypertension    patient denies  . Inguinal hernia   . Prostate cancer ([place]) 07/11/2004    Assessment: 64 YOM with PE on CT, no anticoagulation PTA, SubQ heparin given at 0500.   Goal of Therapy:  Heparin level 0.3-0.7 units/ml Monitor platelets by anticoagulation protocol: Yes   Plan:  Heparin 3000 units IV x1 (slightly lower d/t SubQ hep given), and gtt at 1200 units/hr F/u 6 hour heparin level  Bertis Ruddy, PharmD Clinical Pharmacist Please check AMION for all [facility] numbers 2018-08-14 5:52 AM

## 2018-08-02 NOTE — H&P (Signed)
..   NAME:  Perry Robinson, MRN:  [id number], DOB:  February 02, 1951, LOS: 0 ADMISSION DATE:  07/14/2018, CONSULTATION DATE:  Aug 16, 2018 REFERRING MD:  Billy Fischer MD, CHIEF COMPLAINT:  S/p Cardiac Arrest   Brief History   68 yr old M w/ PMHx Pancreatic CA and Prostate CA, GERD, HTN and ongoing hematuria presents s/p cardiac arrest, intubated on vasopressors, multiple arrests in ED and LA >10. [name] asked to admit  History of present illness   History could not be obtained from pt as he is encephalopathic and intubated. However details were obtained from EMR, Family and account of other providers)  68 yr old M w/ PMHx Pancreatic CA s/p surgical resection, Prostate CA s/p surgery and radiation, HTN, GERD and was being seen most recently for ongoing hematuria workup with Dr Jeffie Pollock. She also reports he has had fatigue for the last week but no other focal symptoms. No cough, no fever, no vomiting, no dysuria, no chest pain. Has regular bowels but did not have diarrhea until today had one episode of stool incontinence.  Per Pt's wife: they sleep in separate bedrooms and go to sleep quite early around 8 pm. They had already retired for the evening when she heard him shout her name. She ran to his room and he stated "He could not breathe call the doctor" She called 911 and while on the phone with them he said:"It hurts so bad, Make the pain go away" His wife is not sure where he was having pain.  That was the last thing he said before he becoming unresponsive.    On EMS arrival, he initially had a pulse with a sinus arrhythmia, however while they were in his home, he lost pulses.  He was in PEA arrest.  They began CPR.  This occurred at June 23, 2117.  Per EMS, he had been in and out of PEA arrest during their transport over the last hour( no defibs, 9 x epi).  They report he last had ROSC at 06/23/2153 then lost pulses again. They felt that he had pulses when they pulled up to the bridge but were continuing CPR on arrival.  A  king airway was placed. He has GCS of 3.  He was emergently intubated at [facility] Per the RN pt coded again in the ED   06-23-17 weak pulses and code continued>> 1 epi given>>>>ROSC 2222/06/24  Started on Levo post with increasing requirements 2236 pulses lost again>>>>>2243 ROSC 2257 pulses lost >>>> ROSC 06/23/2299  Received bicarb, calcium and epi during this code Rectal Temp 95.3 2351 CVC placed by EDP  [name] consulted for admission  On my evaluation: pt on Levophed, s/p CVC placement, intubated Not requiring sedation, no gag, no withdrawal from painful stimuli Spoke with Family in consultation room to acquire timeline and try to gather additional information Past Medical History  .Marland Kitchen Active Ambulatory Problems    Diagnosis Date Noted   Essential hypertension    Prostate cancer ([place])    Back pain with left-sided radiculopathy 04/13/2013   Diverticulosis of colon 06/09/2013   History of cervical discectomy 05/29/2015   Cancer of pancreas, body ([place]) 06/28/2015   Carcinoma of body of pancreas ([place]) 08/27/2015   Family history of prostate cancer 09/12/2015   Hematemesis 12/05/2015   Melena    Protein-calorie malnutrition, severe ([facility])    Acute blood loss anemia    Acquired gastric fistula 12/21/2015   Genetic testing 12/27/2015   Resolved Ambulatory Problems    Diagnosis Date Noted  No Resolved Ambulatory Problems   Past Medical History:  Diagnosis Date   Cataract    Diverticulosis    GERD (gastroesophageal reflux disease)    Hypertension    Inguinal hernia      Significant Hospital Events   Multiple code events in ED: 2219 weak pulses and code continued>> 1 epi given>>>>ROSC 2224  Started on Levo post with increasing requirements 2236 pulses lost again>>>>>2243 ROSC 2257 pulses lost >>>> ROSC 2301  Received bicarb, calcium and epi during this code Rectal Temp 95.3 2351 CVC placed by EDP    Consults:  [name]>>>> 07/27/2018  Procedures:    5/16>>>endotracheally intubated 5/16>>>>CVC placed  Significant Diagnostic Tests:  Jul 26, 2018 CT Angio Chest:  Small segmental/subsegmental pulmonary emboli bilaterally. Large right pleural effusion. Underlying lung is poorly evaluated but suspicious for large mass at least in the right upper lobe. Additional masslike consolidation in the right middle lobe and mass versus volume loss in the right lower lobe. Suspected mediastinal lymphadenopathy, poorly evaluated. Patchy left upper and lower lobe opacities, likely reflecting a combination of atelectasis and pneumonia. Lytic destructive metastasis involving the manubrium.  2018-07-26 CTH w/o contrast: Cerebral edema, most evident in the anterior cerebral artery territory, including the heads of the caudate nuclei. Further characterization with MRI is recommended   26-Jul-2018 CXR IMPRESSION: 1. Tip of the right subclavian central venous catheter in the mid SVC. No pneumothorax. 2. Endotracheal tube in place. Side-port of the enteric tube remains in the region the gastroesophageal junction and should be advanced. 3. Moderate to large right pleural effusion with associated airspace disease. Left perihilar opacities may be atelectasis or pulmonary edema.   Micro Data:  5/17 Blood cx x 2 sent  Antimicrobials:  5/16 Vancomycin and Zosyn given x1 in ED    Objective   Blood pressure (!) 78/53, pulse 86, temperature (!) 92.6 F (33.7 C), resp. rate (!) 21, height 5\' 11"  (1.803 m), weight 70.7 kg, SpO2 100 %.    Vent Mode: PRVC FiO2 (%):  [100 %] 100 % Set Rate:  [18 bmp-26 bmp] 26 bmp Vt Set:  [500 mL-600 mL] 600 mL PEEP:  [5 cmH20] 5 cmH20 Plateau Pressure:  [31 cmH20] 31 cmH20   Intake/Output Summary (Last 24 hours) at Jul 26, 2018 0236 Last data filed at 07/26/2018 0142 Gross per 24 hour  Intake 2331.37 ml  Output --  Net 2331.37 ml   Filed Weights   07/05/2018 2316  Weight: 70.7 kg    Examination: General: well  nourished male, intubated not responsive HENT: normocephalic, ETT in oropharynx with OGT draining bloody output Lungs: decreased on the right compared to left, + crackles and rhonic Cardiovascular: S1 and S2 appreciated no rub Abdomen: soft non tender hypoactive BS Extremities: no gross deformities trace edema in legs Neuro: no gag, no withdrawal from pain, pupils fixed not reactive GU: indwelling foley bloody output   Assessment & Plan:  1. S/p Cardiac Arrest Multiple times LA >11 CTH shows cerebral edema Poor prognosis: discussed with family code status Limited DNR> no CPR no ACLS meds no Code Blue. Plan: Continue current post resuscitative care in ICU Not a candidate for TTM Continue on Vasopressors with MAP goal >22mmHg  2. Acute Encephalopathy Secondary to ?Anxoic injury Cerebral edema seen on CTH Plan: Continues off sedation with Neurochecks Recommend Consult to Neurology  Rads recommends MRI  3. Acute Hypoxic resp Failure: Multifactorial etiology: Segmental/subsegmental Pes w/ pleural effusion endotracheally intubated continues on PRVC Plan: TV 8cc/kg  Titrate FiO2 to keep  O2 sat >95% F/u ABG to adjust settings Not a candidate for emergent thoracentesis VAPrecautions S/p empiric abx for multifocal pneumonia  4. Anemia (chronic) Pt having longstanding hematuria Plan: If Hgb <7 recommend transfusion  5. AKI Anion Gap Metabolic Acidosis Plan: Sent UA continue with foley Monitor Is and Os Avoid nephrotoxic meds Check CK  Bicarb gtt  Best practice:  Diet: NPO Pain/Anxiety/Delirium protocol (if indicated): not needed at this time VAP protocol (if indicated): indicated DVT prophylaxis: Heparin gtt started  GI prophylaxis: PPI Glucose control: if BG exceeds 180mg /dl start ISS Mobility: bedrest Code Status: if pt arrests again no CPR no ACLS push meds per wife.Family Communication: Wife, brother and sister were present in ED consultation room. We spoke  about the very critical condition Mr Sandlin was in and we addressed his code status. Disposition: ICU  Labs   CBC: Recent Labs  Lab 07/31/2018 2210 07/22/2018 2311 25-Jul-2018 0013  WBC 9.9  --   --   NEUTROABS 3.0  --   --   HGB 10.3* 8.2* 7.8*  HCT 34.6* 24.0* 23.0*  MCV 103.9*  --   --   PLT 143*  --   --     Basic Metabolic Panel: Recent Labs  Lab 07/22/2018 2210 07/05/2018 2311 07-25-18 0013  NA 139 138 140  K 3.3* 3.8 4.4  CL 104  --   --   CO2 19*  --   --   GLUCOSE 302*  --   --   BUN 11  --   --   CREATININE 1.17  --   --   CALCIUM 8.8*  --   --   MG 2.6*  --   --   PHOS 8.7*  --   --    GFR: Estimated Creatinine Clearance: 59.6 mL/min (by C-G formula based on SCr of 1.17 mg/dL). Recent Labs  Lab 07/27/2018 2210 07/11/2018 2302  WBC 9.9  --   LATICACIDVEN  --  10.6*    Liver Function Tests: Recent Labs  Lab 07/02/2018 2210  AST 107*  ALT 49*  ALKPHOS 142*  BILITOT 0.6  PROT 5.4*  ALBUMIN 2.8*   No results for input(s): LIPASE, AMYLASE in the last 168 hours. No results for input(s): AMMONIA in the last 168 hours.  ABG    Component Value Date/Time   PHART 7.059 (LL) 2018/07/25 0013   PCO2ART 51.4 (H) 2018-07-25 0013   PO2ART 78.0 (L) Jul 25, 2018 0013   HCO3 14.5 (L) 2018/07/25 0013   TCO2 16 (L) 07/25/18 0013   ACIDBASEDEF 15.0 (H) 07/25/18 0013   O2SAT 88.0 2018/07/25 0013     Coagulation Profile: No results for input(s): INR, PROTIME in the last 168 hours.  Cardiac Enzymes: Recent Labs  Lab 07/17/18 2210  TROPONINI 0.06*    HbA1C: No results found for: HGBA1C  CBG: No results for input(s): GLUCAP in the last 168 hours.  Review of Systems:   Marland KitchenMarland KitchenReview of Systems  Unable to perform ROS: Patient unresponsive     Past Medical History  He,  has a past medical history of Cancer of pancreas, body ([place]) (08/2015), Cataract, Diverticulosis, GERD (gastroesophageal reflux disease), Hypertension, Inguinal hernia, and Prostate cancer ([place])  (07/11/2004).   Surgical History    Past Surgical History:  Procedure Laterality Date   CATARACT EXTRACTION Bilateral 2011   CYSTOSCOPY W/ URETERAL STENT PLACEMENT Bilateral 10/09/2016   Procedure: CYSTOSCOPY WITH BILATERAL  RETROGRADE AND URETERAL STENT PLACEMENT, TURBT;  Surgeon: Irine Seal, MD;  Location: [facility];  Service: Urology;  Laterality: Bilateral;   EUS N/A 06/28/2015   Procedure: UPPER ENDOSCOPIC ULTRASOUND (EUS) LINEAR;  Surgeon: [name], MD;  Location: [facility];  Service: Endoscopy;  Laterality: N/A;  spoke with patient about new check in/procedure times [place] NEEDLE ASPIRATION  06/28/2015   Procedure: FINE NEEDLE ASPIRATION;  Surgeon: [name], MD;  Location: [facility];  Service: Endoscopy;;   INGUINAL HERNIA REPAIR Right 08/27/2015   Procedure: OPEN RIGHT INGUINAL HERNIA ;  Surgeon: Ralene Ok, MD;  Location: [facility];  Service: General;  Laterality: Right;   INSERTION OF MESH Right 08/27/2015   Procedure: INSERTION OF MESH;  Surgeon: Ralene Ok, MD;  Location: [facility];  Service: General;  Laterality: Right;   LAPAROSCOPY N/A 08/27/2015   Procedure: LAPAROSCOPY DIAGNOSTIC;  Surgeon: Stark Klein, MD;  Location: [facility];  Service: General;  Laterality: N/A;   PROSTATECTOMY  08/2004   2nd to [place] Bilateral 10/2008   11,10     Social History   reports that he has been smoking cigarettes. He has a 2.50 pack-year smoking history. He has never used smokeless tobacco. He reports current alcohol use. He reports that he does not use drugs.   Family History   His family history includes Prostate cancer in his brother, father, and maternal grandfather; Thyroid disease in his mother. There is no history of Colon cancer.   Allergies No Known Allergies   Home Medications  Prior to Admission medications   Medication Sig Start Date End Date Taking? Authorizing Provider  cephALEXin (KEFLEX) 500 MG capsule Take 1  capsule (500 mg total) by mouth 4 (four) times daily. 11/03/16   Lacretia Leigh, MD  HYDROcodone-acetaminophen (NORCO) 5-325 MG tablet Take 1 tablet by mouth every 6 (six) hours as needed for moderate pain. Patient not taking: Reported on 11/03/2016 10/09/16   Irine Seal, MD  oxybutynin (DITROPAN) 5 MG tablet Take 1 tablet (5 mg total) by mouth 3 (three) times daily as needed for bladder spasms. 10/09/16   Irine Seal, MD  tamsulosin (FLOMAX) 0.4 MG CAPS capsule TK 1 C PO QD 11/05/16   [provider]    STAFF NOTE  I, Dr Seward Carol have personally reviewed patient's available data, including medical history, events of note, physical examination and test results as part of my evaluation. I have discussed and other care providers such as pharmacist, RN and Elink.  In addition,  I personally evaluated patient  The patient is critically ill with multiple organ systems failure and requires high complexity decision making for assessment and support, frequent evaluation and titration of therapies, application of advanced monitoring technologies and extensive interpretation of multiple databases.   Critical Care Time devoted to patient care services described in this note is  75 Minutes. This time reflects time of care of this signee Dr Seward Carol. This critical care time does not reflect procedure time, or teaching time or supervisory time but could involve care discussion time    Dr. Seward Carol Pulmonary Critical Care Medicine  08-06-2018 2:36 AM      Critical care time: 75 mins

## 2018-08-02 NOTE — Progress Notes (Signed)
..   NAME:  Perry Robinson, MRN:  [id number], DOB:  [birth year], LOS: 0 ADMISSION DATE:  07/20/2018, CONSULTATION DATE:  08-07-18 REFERRING MD:  Billy Fischer MD, CHIEF COMPLAINT:  S/p Cardiac Arrest   Brief History   68 yr old M w/ PMHx Pancreatic CA and Prostate CA, GERD, HTN and ongoing hematuria (workup by Dr Jeffie Pollock) presents s/p cardiac arrest, intubated on vasopressors, multiple arrests in ED and LA >10. [name] asked to admit  Patient was intermittently coded by EMS and ED for close to 2 hours before ultimately ROSC was achieved.  Past Medical History  .Marland Kitchen Active Ambulatory Problems    Diagnosis Date Noted  . Essential hypertension   . Prostate cancer ([place])   . Back pain with left-sided radiculopathy 04/13/2013  . Diverticulosis of colon 06/09/2013  . History of cervical discectomy 05/29/2015  . Cancer of pancreas, body ([place]) 06/28/2015  . Carcinoma of body of pancreas ([place]) 08/27/2015  . Family history of prostate cancer 09/12/2015  . Hematemesis 12/05/2015  . Melena   . Protein-calorie malnutrition, severe ([place])   . Acute blood loss anemia   . Acquired gastric fistula 12/21/2015  . Genetic testing 12/27/2015   Resolved Ambulatory Problems    Diagnosis Date Noted  . No Resolved Ambulatory Problems   Past Medical History:  Diagnosis Date  . Cataract   . Diverticulosis   . GERD (gastroesophageal reflux disease)   . Hypertension   . Inguinal hernia     Significant Hospital Events   Multiple code events in ED: 2219 weak pulses and code continued>> 1 epi given>>>>ROSC 2224  Started on Levo post with increasing requirements 2236 pulses lost again>>>>>2243 ROSC 2257 pulses lost >>>> ROSC 2301  Received bicarb, calcium and epi during this code 2351 CVC placed by EDP   Consults:  [name]>>>> 07/19/2018  Procedures:  5/16>>>endotracheally intubated 5/16>>>>CVC placed  Significant Diagnostic Tests:  August 07, 2018 CT Angio Chest:  Small segmental/subsegmental pulmonary  emboli bilaterally. Large right pleural effusion. Underlying lung is poorly evaluated but suspicious for large mass at least in the right upper lobe. Additional masslike consolidation in the right middle lobe and mass versus volume loss in the right lower lobe. Suspected mediastinal lymphadenopathy, poorly evaluated. Patchy left upper and lower lobe opacities, likely reflecting a combination of atelectasis and pneumonia. Lytic destructive metastasis involving the manubrium.  08/07/18 CTH w/o contrast: Cerebral edema, most evident in the anterior cerebral artery territory, including the heads of the caudate nuclei. Further characterization with MRI is recommended   Aug 07, 2018 CXR IMPRESSION: 1. Tip of the right subclavian central venous catheter in the mid SVC. No pneumothorax. 2. Endotracheal tube in place. Side-port of the enteric tube remains in the region the gastroesophageal junction and should be advanced. 3. Moderate to large right pleural effusion with associated airspace disease. Left perihilar opacities may be atelectasis or pulmonary edema.   Micro Data:  5/17 Blood cx>   Antimicrobials:  Vanco 5/17>  Zosyn 5/17 >  Interim history/subjective:  Maxed out on epi, vaso-, levo, Neo-Synephrine Continues on bicarb drip and heparin Mental status remains poor with no response off sedation.  Objective   Blood pressure (!) 89/60, pulse (!) 124, temperature (!) 94.1 F (34.5 C), resp. rate (!) 30, height 5\' 11"  (1.803 m), weight 75 kg, SpO2 100 %.    Vent Mode: PRVC FiO2 (%):  [100 %] 100 % Set Rate:  [18 bmp-30 bmp] 30 bmp Vt Set:  [500 mL-600 mL] 600 mL PEEP:  [5 cmH20]  [place] Pressure:  [28 ZOX09-60 cmH20] 30 cmH20   Intake/Output Summary (Last 24 hours) at 08/15/2018 1001 Last data filed at 08/15/18 0700 Gross per 24 hour  Intake 4280.68 ml  Output 20 ml  Net 4260.68 ml   Filed Weights   07/02/2018 2316 08-15-2018 0500  Weight: 70.7 kg 75 kg     Examination: Gen:      No acute distress HEENT:  EOMI, sclera anicteric Neck:     No masses; no thyromegaly, ETT Lungs:    Clear to auscultation bilaterally; normal respiratory effort CV:         Regular rate and rhythm; no murmurs Abd:      + bowel sounds; soft, non-tender; no palpable masses, no distension Ext:    No edema; adequate peripheral perfusion Skin:      Warm and dry; no rash Neuro: Sedated, Unresponsive  Assessment & Plan:  68 year old with history of multiple cancers including pancreatic, prostate, recent work-up for hematuria admitted after prolonged cardiac arrest.  Found to have subsegmental PEs, concern for anoxic brain injury and possible lung metastasis.  Cardiac Arrest Elevated lactic acid Poor prognosis: discussed with family code status Limited DNR> no CPR no ACLS meds no Code Blue. Plan: Continue supportive care Not a candidate for hypothermia Wean down vasopressors as tolerated  Acute Encephalopathy Plan for anoxic injury Cerebral edema seen on CTH Plan: Continues off sedation with Neurochecks EEG, MRI  Acute Hypoxic resp Failure: Multifactorial etiology: Segmental/subsegmental Pes w/ pleural effusion endotracheally intubated continues on PRVC Plan: Continue antibiotics Vent support Follow chest x-ray, ABG Heparin drip for anticoagulation  Anemia (chronic) Pt having longstanding hematuria Plan: Follow CBC  AKI Anion Gap Metabolic Acidosis Plan: Bicarb drip Follow urine output and creatinine  Best practice:  Diet: NPO Pain/Anxiety/Delirium protocol (if indicated): not needed at this time VAP protocol (if indicated): indicated DVT prophylaxis: Heparin gtt GI prophylaxis: PPI Glucose control: if BG exceeds 180mg /dl start ISS Mobility: bedrest Code Status: if pt arrests again no CPR no ACLS push meds per wife. Family Communication: Family on the way in.  Will discuss goals of care when they arrive.. Disposition: ICU  Labs   CBC:  Recent Labs  Lab 07/16/2018 2210 07/03/2018 2311 August 15, 2018 0013 08/15/18 0249 08-15-2018 0435  WBC 9.9  --   --  14.0*  --   NEUTROABS 3.0  --   --  11.9*  --   HGB 10.3* 8.2* 7.8* 8.3* 6.5*  HCT 34.6* 24.0* 23.0* 27.4* 19.0*  MCV 103.9*  --   --  101.9*  --   PLT 143*  --   --  136*  --     Basic Metabolic Panel: Recent Labs  Lab 07/26/2018 2210 07/17/18 2311 2018-08-15 0013 08/15/18 0249 2018-08-15 0435  NA 139 138 140 143 142  K 3.3* 3.8 4.4 4.1 4.0  CL 104  --   --  111  --   CO2 19*  --   --  16*  --   GLUCOSE 302*  --   --  183*  --   BUN 11  --   --  12  --   CREATININE 1.17  --   --  1.16  --   CALCIUM 8.8*  --   --  7.9*  --   MG 2.6*  --   --  2.1  --   PHOS 8.7*  --   --  7.2*  --    GFR: Estimated Creatinine Clearance:  63.8 mL/min (by C-G formula based on SCr of 1.16 mg/dL). Recent Labs  Lab 07/04/2018 2210 07/23/2018 2302 11-Aug-2018 0117 2018-08-11 0249 11-Aug-2018 0500  WBC 9.9  --   --  14.0*  --   LATICACIDVEN  --  10.6* 10.9*  --  >11.0*    Liver Function Tests: Recent Labs  Lab 07/26/2018 2210 2018-08-11 0249  AST 107* 328*  ALT 49* 112*  ALKPHOS 142* 214*  BILITOT 0.6 0.8  PROT 5.4* 4.0*  ALBUMIN 2.8* 2.0*   No results for input(s): LIPASE, AMYLASE in the last 168 hours. No results for input(s): AMMONIA in the last 168 hours.  ABG    Component Value Date/Time   PHART 7.134 (LL) 08/11/18 0435   PCO2ART 35.2 2018/08/11 0435   PO2ART 73.0 (L) 08/11/2018 0435   HCO3 12.6 (L) 08/11/2018 0435   TCO2 14 (L) 08-11-18 0435   ACIDBASEDEF 16.0 (H) 08-11-18 0435   O2SAT 94.0 11-Aug-2018 0435     Coagulation Profile: Recent Labs  Lab 08-11-2018 0249  INR 3.7*    Cardiac Enzymes: Recent Labs  Lab 07/26/2018 2210  TROPONINI 0.06*    HbA1C: No results found for: HGBA1C  CBG: No results for input(s): GLUCAP in the last 168 hours.  The patient is critically ill with multiple organ system failure and requires high complexity decision making for  assessment and support, frequent evaluation and titration of therapies, advanced monitoring, review of radiographic studies and interpretation of complex data.   Critical Care Time devoted to patient care services, exclusive of separately billable procedures, described in this note is 35 minutes.   Marshell Garfinkel MD [facility] Pager [phone] If no answer call [phone] 08/11/18, 10:11 AM

## 2018-08-02 NOTE — ED Notes (Signed)
Chrissie Noa is pt brother [phone] lives next door to patients wife, Romie Minus [phone]. Both are contacts for this patient.

## 2018-08-02 NOTE — ED Notes (Signed)
Provider remains at bedside for central line placement

## 2018-08-02 NOTE — ED Notes (Signed)
Pt not a candidate for cooling per ccm

## 2018-08-02 NOTE — Procedures (Signed)
Arterial Catheter Insertion Procedure Note Perry Robinson [id number] [birth year]  Procedure: Insertion of Arterial Catheter  Indications: Blood pressure monitoring and Frequent blood sampling  Procedure Details Consent: Risks of procedure as well as the alternatives and risks of each were explained to the (patient/caregiver).  Consent for procedure obtained. Time Out: Verified patient identification, verified procedure, site/side was marked, verified correct patient position, special equipment/implants available, medications/allergies/relevent history reviewed, required imaging and test results available.  Performed  Maximum sterile technique was used including antiseptics, cap, gloves, gown, hand hygiene, mask and sheet. Skin prep: Chlorhexidine; local anesthetic administered 20 gauge catheter was inserted into right radial artery using the Seldinger technique. ULTRASOUND GUIDANCE USED: NO Evaluation Blood flow good; BP tracing good. Complications: No apparent complications.   Alvera Singh 08-04-2018

## 2018-08-02 DEATH — deceased

## 2018-08-04 ENCOUNTER — Other Ambulatory Visit: Payer: Medicare HMO
